# Patient Record
Sex: Female | Born: 1960 | Race: Black or African American | Hispanic: No | Smoking: Former smoker
Health system: Southern US, Community
[De-identification: ages and names within clinical notes are randomized; demographics above are authoritative.]

## PROBLEM LIST (undated history)

## (undated) DIAGNOSIS — R51 Headache: Secondary | ICD-10-CM

## (undated) DIAGNOSIS — E7800 Pure hypercholesterolemia, unspecified: Secondary | ICD-10-CM

## (undated) DIAGNOSIS — I10 Essential (primary) hypertension: Secondary | ICD-10-CM

## (undated) DIAGNOSIS — M1990 Unspecified osteoarthritis, unspecified site: Secondary | ICD-10-CM

## (undated) DIAGNOSIS — IMO0002 Reserved for concepts with insufficient information to code with codable children: Secondary | ICD-10-CM

## (undated) HISTORY — PX: TUBAL LIGATION: SHX77

## (undated) HISTORY — DX: Unspecified osteoarthritis, unspecified site: M19.90

## (undated) HISTORY — PX: UTERINE FIBROID SURGERY: SHX826

---

## 1998-07-20 ENCOUNTER — Emergency Department (HOSPITAL_COMMUNITY): Admission: EM | Admit: 1998-07-20 | Discharge: 1998-07-20 | Payer: Self-pay | Admitting: Emergency Medicine

## 1999-02-07 ENCOUNTER — Emergency Department (HOSPITAL_COMMUNITY): Admission: EM | Admit: 1999-02-07 | Discharge: 1999-02-07 | Payer: Self-pay | Admitting: Emergency Medicine

## 1999-08-04 ENCOUNTER — Emergency Department (HOSPITAL_COMMUNITY): Admission: EM | Admit: 1999-08-04 | Discharge: 1999-08-04 | Payer: Self-pay | Admitting: Emergency Medicine

## 2000-06-06 ENCOUNTER — Encounter: Payer: Self-pay | Admitting: Nephrology

## 2000-06-06 ENCOUNTER — Ambulatory Visit (HOSPITAL_COMMUNITY): Admission: RE | Admit: 2000-06-06 | Discharge: 2000-06-06 | Payer: Self-pay | Admitting: Nephrology

## 2000-07-06 ENCOUNTER — Other Ambulatory Visit: Admission: RE | Admit: 2000-07-06 | Discharge: 2000-07-06 | Payer: Self-pay | Admitting: Obstetrics & Gynecology

## 2001-12-10 ENCOUNTER — Other Ambulatory Visit: Admission: RE | Admit: 2001-12-10 | Discharge: 2001-12-10 | Payer: Self-pay | Admitting: Obstetrics and Gynecology

## 2002-01-08 ENCOUNTER — Ambulatory Visit (HOSPITAL_COMMUNITY): Admission: RE | Admit: 2002-01-08 | Discharge: 2002-01-08 | Payer: Self-pay | Admitting: Obstetrics and Gynecology

## 2002-01-08 ENCOUNTER — Encounter: Payer: Self-pay | Admitting: Obstetrics and Gynecology

## 2002-04-10 ENCOUNTER — Encounter: Payer: Self-pay | Admitting: Obstetrics and Gynecology

## 2002-04-10 ENCOUNTER — Ambulatory Visit (HOSPITAL_COMMUNITY): Admission: RE | Admit: 2002-04-10 | Discharge: 2002-04-10 | Payer: Self-pay | Admitting: Obstetrics and Gynecology

## 2002-10-09 ENCOUNTER — Encounter: Payer: Self-pay | Admitting: *Deleted

## 2002-10-09 ENCOUNTER — Emergency Department (HOSPITAL_COMMUNITY): Admission: EM | Admit: 2002-10-09 | Discharge: 2002-10-09 | Payer: Self-pay | Admitting: *Deleted

## 2003-01-16 ENCOUNTER — Ambulatory Visit (HOSPITAL_COMMUNITY): Admission: RE | Admit: 2003-01-16 | Discharge: 2003-01-16 | Payer: Self-pay | Admitting: Obstetrics and Gynecology

## 2003-01-16 ENCOUNTER — Encounter (INDEPENDENT_AMBULATORY_CARE_PROVIDER_SITE_OTHER): Payer: Self-pay

## 2003-10-29 ENCOUNTER — Ambulatory Visit (HOSPITAL_COMMUNITY): Admission: RE | Admit: 2003-10-29 | Discharge: 2003-10-29 | Payer: Self-pay | Admitting: Gastroenterology

## 2005-03-17 ENCOUNTER — Emergency Department (HOSPITAL_COMMUNITY): Admission: EM | Admit: 2005-03-17 | Discharge: 2005-03-17 | Payer: Self-pay | Admitting: Emergency Medicine

## 2005-05-10 ENCOUNTER — Ambulatory Visit: Payer: Self-pay | Admitting: Internal Medicine

## 2005-05-13 ENCOUNTER — Ambulatory Visit: Payer: Self-pay | Admitting: *Deleted

## 2005-06-15 ENCOUNTER — Ambulatory Visit: Payer: Self-pay | Admitting: Family Medicine

## 2005-06-23 ENCOUNTER — Ambulatory Visit: Payer: Self-pay | Admitting: Family Medicine

## 2005-09-02 ENCOUNTER — Ambulatory Visit: Payer: Self-pay | Admitting: Family Medicine

## 2005-10-14 ENCOUNTER — Ambulatory Visit: Payer: Self-pay | Admitting: Family Medicine

## 2006-01-27 ENCOUNTER — Ambulatory Visit: Payer: Self-pay | Admitting: Family Medicine

## 2006-09-20 ENCOUNTER — Ambulatory Visit: Payer: Self-pay | Admitting: Family Medicine

## 2006-09-22 ENCOUNTER — Ambulatory Visit: Payer: Self-pay | Admitting: Family Medicine

## 2006-09-29 ENCOUNTER — Ambulatory Visit: Payer: Self-pay | Admitting: Family Medicine

## 2006-09-29 ENCOUNTER — Ambulatory Visit (HOSPITAL_COMMUNITY): Admission: RE | Admit: 2006-09-29 | Discharge: 2006-09-29 | Payer: Self-pay | Admitting: Family Medicine

## 2006-11-02 ENCOUNTER — Ambulatory Visit: Payer: Self-pay | Admitting: Family Medicine

## 2008-04-05 ENCOUNTER — Emergency Department (HOSPITAL_COMMUNITY): Admission: EM | Admit: 2008-04-05 | Discharge: 2008-04-05 | Payer: Self-pay | Admitting: Emergency Medicine

## 2009-03-27 ENCOUNTER — Emergency Department (HOSPITAL_COMMUNITY): Admission: EM | Admit: 2009-03-27 | Discharge: 2009-03-27 | Payer: Self-pay | Admitting: Emergency Medicine

## 2010-11-15 ENCOUNTER — Emergency Department (HOSPITAL_COMMUNITY)
Admission: EM | Admit: 2010-11-15 | Discharge: 2010-11-15 | Payer: Self-pay | Source: Home / Self Care | Admitting: Emergency Medicine

## 2010-12-25 ENCOUNTER — Encounter: Payer: Self-pay | Admitting: Family Medicine

## 2011-02-15 LAB — CBC
HCT: 35.7 % — ABNORMAL LOW (ref 36.0–46.0)
Hemoglobin: 11.2 g/dL — ABNORMAL LOW (ref 12.0–15.0)
MCHC: 31.4 g/dL (ref 30.0–36.0)
RDW: 21.6 % — ABNORMAL HIGH (ref 11.5–15.5)
WBC: 9.2 10*3/uL (ref 4.0–10.5)

## 2011-02-15 LAB — URINALYSIS, ROUTINE W REFLEX MICROSCOPIC
Bilirubin Urine: NEGATIVE
Protein, ur: NEGATIVE mg/dL
Urobilinogen, UA: 0.2 mg/dL (ref 0.0–1.0)

## 2011-02-15 LAB — DIFFERENTIAL
Basophils Absolute: 0 10*3/uL (ref 0.0–0.1)
Basophils Relative: 0 % (ref 0–1)
Lymphocytes Relative: 25 % (ref 12–46)
Monocytes Absolute: 0.7 10*3/uL (ref 0.1–1.0)
Neutro Abs: 6 10*3/uL (ref 1.7–7.7)
Neutrophils Relative %: 65 % (ref 43–77)

## 2011-02-15 LAB — PROTIME-INR: INR: 1.03 (ref 0.00–1.49)

## 2011-02-15 LAB — POCT PREGNANCY, URINE: Preg Test, Ur: NEGATIVE

## 2011-02-15 LAB — URINE MICROSCOPIC-ADD ON

## 2011-02-15 LAB — APTT: aPTT: 30 seconds (ref 24–37)

## 2011-03-16 LAB — BASIC METABOLIC PANEL
BUN: 5 mg/dL — ABNORMAL LOW (ref 6–23)
Calcium: 8.8 mg/dL (ref 8.4–10.5)
Chloride: 108 mEq/L (ref 96–112)
Creatinine, Ser: 0.79 mg/dL (ref 0.4–1.2)
GFR calc Af Amer: >60 mL/min (ref >60)
GFR calc non Af Amer: >60 mL/min (ref >60)

## 2011-03-16 LAB — DIFFERENTIAL
Eosinophils Absolute: 0.1 10*3/uL (ref 0.0–0.7)
Lymphocytes Relative: 25 % (ref 12–46)
Lymphs Abs: 1.6 10*3/uL (ref 0.7–4.0)
Monocytes Relative: 8 % (ref 3–12)
Neutro Abs: 4.1 10*3/uL (ref 1.7–7.7)
Neutrophils Relative %: 65 % (ref 43–77)

## 2011-03-16 LAB — POCT CARDIAC MARKERS
CKMB, poc: <1 ng/mL — ABNORMAL LOW (ref 1.0–8.0)
Myoglobin, poc: 62.4 ng/mL (ref 12–200)

## 2011-03-16 LAB — CBC
Platelets: 270 10*3/uL (ref 150–400)
RBC: 4.39 MIL/uL (ref 3.87–5.11)
WBC: 6.4 10*3/uL (ref 4.0–10.5)

## 2011-04-22 NOTE — H&P (Signed)
NAME:  Frances Conley, Frances Conley                            ACCOUNT NO.:  [id number]   MEDICAL RECORD NO.:  [id number]                   PATIENT TYPE:  AMB   LOCATION:  SDC                                  FACILITY:  [facility]   PHYSICIAN:  Naima A. Dillard, M.D.              DATE OF BIRTH:  [birth year]   DATE OF ADMISSION:  01/14/2003  DATE OF DISCHARGE:                                HISTORY & PHYSICAL   CHIEF COMPLAINT:  Menometrorrhagia from endometrial fibroids.   HISTORY OF PRESENT ILLNESS:  The patient is a 50 year old gravida 3, para 3,  admitted for a D&C, hysteroscopy, resection of submucosal component of  fibroids for menometrorrhagia for several years.  The patient reports that  her periods were every 21 days, lasting for five to six days, and she  changed a pad or tampon every 30 minutes.  Also complains of occasional  intramenstrual spotting.  She does have dysmenorrhea.  She has tried Motrin  and Ortho Evra in the past, but felt that they did not help with the  bleeding.  The patient also had an endometrial biopsy which was benign.  She  had an ultrasound which showed her uterus to measure 11.9 cm x 4.6 cm x 7.5  cm, and demonstrated a fibroid in the right anterior upper uterine body  measuring 2.3 cm x 2.2 cm x 2.7 cm.  Upon a hysterogram it did show that 25%  of the fibroid had a submucosal component.  There were, however, no polyps  or endometrial thickening seen on the sonohysterogram.  The patient also had  an MRI in the past.  The MRI was significant for another small fibroid  measuring 2.0 cm at the fundus.  The patient did go for a consultation for  uterine artery embolization, but decided that she was not a candidate due to  how small the fibroids were.  The patient has agreed to try a D&C and  hysteroscopy.  She was offered other treatments such as just Lupron alone,  Depo-Provera, Lupron with a D&C, hysteroscopy, hysterectomy, myomectomy.  The patient has chosen a D&C with  hysteroscopy, myomectomy and Lupron.  The  patient has received a total of six months of Lupron.   PAST MEDICAL HISTORY:  1. Significant for hypertension.  2. Hypercholesterolemia.  3. Peptic ulcer disease.  4. Depression.  5. Questionable hypothyroidism.   MEDICATIONS:  1. Atenolol 50 mg p.o. daily.  2. She has had six months of Lupron.   PAST SURGICAL HISTORY:  1. Significant for a tubal ligation.  2. Three C-sections.   SOCIAL HISTORY:  Negative x3.  She just quit tobacco use in 2003.   FAMILY HISTORY:  Significant for hypertension and diabetes mellitus.  The  mother had breast cancer.  She is now deceased, and the patient is not sure  what her mother died from.   REVIEW OF SYSTEMS:  ENDOCRINE:  She has questionable hypothyroidism.  She is  supposed to follow up with Dr. Chestine Spore tomorrow for an evaluation.  CARDIOVASCULAR:  Unremarkable.  GI:  Significant for peptic ulcer disease.  GENITOURINARY:  As above.  MUSCULOSKELETAL:  Unremarkable.  PSYCHIATRIC:  A  history of depression, currently on no medications, and the patient denies  any suicidal or homicidal ideations.   PHYSICAL EXAMINATION:  VITAL SIGNS:  Blood pressure 130/80, weight 168  pounds.  HEENT:  Pupils equal.  Hearing is normal.  Throat is clear.  NECK:  Thyroid is not enlarged.  HEART:  A regular rate and rhythm.  CHEST:  Clear to auscultation bilaterally.  BACK:  No CVA tenderness.  ABDOMEN:  Nontender.  No masses or organomegaly.  EXTREMITIES:  No clubbing, cyanosis, or edema.  NEUROLOGIC:  Within normal limits.  PELVIC:  The vaginal examination is within normal limits.  Cervix is  nontender without any lesions.  Uterus is about 12-14 weeks size, nontender,  mobile.  Adnexa had no masses, nontender.   ASSESSMENT:  Symptomatic submucosal fibroids.  Patient with  menometrorrhagia.   PLAN:  The patient has chosen to have a D&C, hysteroscopy, hysteroscopic  myomectomy.  The risks of bleeding, infection,  perforation of the uterus,  damage to internal organs, and hysterectomy were explained to the patient in  detail.  The patient has chosen to proceed with the procedure.   LABORATORY DATA:  The ultrasound is as above.  The patient's Pap smear was normal in January  2003.                                                Naima A. Normand Sloop, M.D.    NAD/MEDQ  D:  01/14/2003  T:  01/14/2003  Job:  [id number]

## 2011-04-22 NOTE — Op Note (Signed)
NAME:  Frances Conley, Frances Conley                            ACCOUNT NO.:  [id number]   MEDICAL RECORD NO.:  [id number]                   PATIENT TYPE:  AMB   LOCATION:  ENDO                                 FACILITY:  [facility]   PHYSICIAN:  Graylin Shiver, M.D.                DATE OF BIRTH:  Feb 28, 1961   DATE OF PROCEDURE:  10/29/2003  DATE OF DISCHARGE:                                 OPERATIVE REPORT   PROCEDURE:  Esophagogastroduodenoscopy.   INDICATIONS:  Epigastric and abdominal pain, which has been chronic in  nature.  The patient has been taking Nexium but still gets this epigastric  pain.   PROCEDURE IN DETAIL:  Informed consent was obtained after explanation of the  risks of bleeding, infection, and perforation.   PREMEDICATIONS:  Fentanyl 50 mcg, IV Versed 7 mg IV.   PROCEDURE:  With the patient in the left lateral decubitus position, the  Olympus gastroscope was inserted into the oropharynx and passed into the  esophagus.  It was advanced down the esophagus and then into the stomach and  into the duodenum.  The second portion, bulb, and duodenum were normal.  The  stomach looked normal in its entirety.  The esophagus looked normal in its  entirety.  The esophagogastric junction was at 36 cm.  She tolerated the  procedure well without complications.   IMPRESSION:  Normal esophagogastroduodenoscopy.                                               Graylin Shiver, M.D.    [name]  D:  10/29/2003  T:  10/30/2003  Job:  [id number]

## 2011-04-22 NOTE — Op Note (Signed)
NAME:  Frances Conley, Frances Conley                            ACCOUNT NO.:  [id number]   MEDICAL RECORD NO.:  [id number]                   PATIENT TYPE:  AMB   LOCATION:  [facility]C                                  FACILITY:  [facility]   PHYSICIAN:  Naima A. Dillard, M.D.              DATE OF BIRTH:  10/21/1961   DATE OF PROCEDURE:  01/16/2003  DATE OF DISCHARGE:                                 OPERATIVE REPORT   PREOPERATIVE DIAGNOSES:  Menometrorrhagia with submucosal component of  transmural fibroids.   POSTOPERATIVE DIAGNOSES:  Menometrorrhagia with submucosal component of  transmural fibroids.   PROCEDURE:  Dilatation and curettage with hysteroscopic myomectomy.   SURGEON:  Naima A. Normand Sloop, M.D.   ANESTHESIA:  20 mL 1% lidocaine cervical block and laryngeal mask airway  anesthesia.   IV FLUIDS:  1100 mL crystalloid.   ESTIMATED BLOOD LOSS:  None.   COMPLICATIONS:  None.   FLUID DEFICIT:  There was an 85 mL deficit of 3% sorbitol.   FINDINGS:  Uterine sound was placed into uterine cavity to 10 cm.  There was  scant endometrial curettings.  There was a small fibroid on the anterior  wall on the right side of the uterine cavity.  There were no polyps or other  masses noted.  Both ostia were visualized.   PROCEDURE IN DETAIL:  The patient was taken to the operating room where she  was given general anesthesia with laryngeal mask airway and placed in dorsal  lithotomy position, prepped and draped in a normal sterile fashion.  Bladder  was emptied.  An examination was done and patient had 12 week sized slightly  irregular uterus.  No adnexal masses palpated.  A bivalve speculum was  placed into the vagina.  Anterior lip of the cervix was grasped with single  tooth tenaculum.  20 mL 1% lidocaine was used for cervical block.  The  uterine sound was then placed which sounded to 10 cm.  Cervix was then  dilated up to 21.  Diagnostic hysteroscope was placed into the uterine  cavity and the findings  noted above were seen.  We attempted to resect the  fibroid so the hysteroscope was removed.  The cervix was further dilated  with Pratt dilators up to 35.  The hysteroscope was then placed into the  uterine cavity and using a single loop with coagulative cautery, the fibroid  was hysteroscopically removed.  Hemostasis was noted.  There were no sites  of perforation.  The hysteroscope was then removed.  A sharp curette was  placed into the uterine cavity and scant amount of curettings were obtained.  All instruments were removed from the vagina.  The tenaculum was removed  from the anterior lip of the cervix with hemostasis noted.  Sponge, lap, and  needle counts were correct x2.  The patient went to recovery room in stable  condition.  Naima A. Normand Sloop, M.D.   NAD/MEDQ  D:  01/16/2003  T:  01/16/2003  Job:  [id number]

## 2012-06-02 ENCOUNTER — Encounter (HOSPITAL_COMMUNITY): Payer: Self-pay | Admitting: *Deleted

## 2012-06-02 ENCOUNTER — Emergency Department (HOSPITAL_COMMUNITY)
Admission: EM | Admit: 2012-06-02 | Discharge: 2012-06-03 | Disposition: A | Discharge status: Skilled Nursing Facility | Payer: Self-pay | Attending: Emergency Medicine | Admitting: Emergency Medicine

## 2012-06-02 DIAGNOSIS — I10 Essential (primary) hypertension: Secondary | ICD-10-CM | POA: Insufficient documentation

## 2012-06-02 DIAGNOSIS — R51 Headache: Secondary | ICD-10-CM | POA: Insufficient documentation

## 2012-06-02 DIAGNOSIS — E7800 Pure hypercholesterolemia, unspecified: Secondary | ICD-10-CM | POA: Insufficient documentation

## 2012-06-02 DIAGNOSIS — F17200 Nicotine dependence, unspecified, uncomplicated: Secondary | ICD-10-CM | POA: Insufficient documentation

## 2012-06-02 HISTORY — DX: Pure hypercholesterolemia, unspecified: E78.00

## 2012-06-02 HISTORY — DX: Reserved for concepts with insufficient information to code with codable children: IMO0002

## 2012-06-02 HISTORY — DX: Essential (primary) hypertension: I10

## 2012-06-02 HISTORY — DX: Headache: R51

## 2012-06-02 LAB — URINALYSIS, ROUTINE W REFLEX MICROSCOPIC
Bilirubin Urine: NEGATIVE
Glucose, UA: NEGATIVE mg/dL
Hgb urine dipstick: NEGATIVE
Ketones, ur: NEGATIVE mg/dL
Nitrite: NEGATIVE
Protein, ur: NEGATIVE mg/dL
Specific Gravity, Urine: 1.011 (ref 1.005–1.030)
Urobilinogen, UA: 0.2 mg/dL (ref 0.0–1.0)
pH: 6 (ref 5.0–8.0)

## 2012-06-02 LAB — POCT I-STAT, CHEM 8
BUN: 8 mg/dL (ref 6–23)
Calcium, Ion: 1.15 mmol/L (ref 1.12–1.32)
Chloride: 106 mEq/L (ref 96–112)
Creatinine, Ser: 0.9 mg/dL (ref 0.50–1.10)
Glucose, Bld: 81 mg/dL (ref 70–99)
HCT: 45 % (ref 36.0–46.0)
Hemoglobin: 15.3 g/dL — ABNORMAL HIGH (ref 12.0–15.0)
Potassium: 3.4 mEq/L — ABNORMAL LOW (ref 3.5–5.1)
Sodium: 143 mEq/L (ref 135–145)
TCO2: 24 mmol/L (ref 0–100)

## 2012-06-02 LAB — URINE MICROSCOPIC-ADD ON

## 2012-06-02 MED ORDER — HYDROCHLOROTHIAZIDE 25 MG PO TABS: 25.0000 mg | ORAL_TABLET | Freq: Every day | ORAL | Status: DC

## 2012-06-02 MED ORDER — IBUPROFEN 200 MG PO TABS
400.0000 mg | ORAL_TABLET | Freq: Once | ORAL | Status: AC
  Administered 2012-06-02: 400 mg via ORAL
  Filled 2012-06-02 (×2): qty 1

## 2012-06-02 MED ORDER — LORAZEPAM 0.5 MG PO TABS
0.5000 mg | ORAL_TABLET | Freq: Once | ORAL | Status: AC
  Administered 2012-06-02: 0.5 mg via ORAL
  Filled 2012-06-02: qty 1

## 2012-06-02 MED ORDER — OXYCODONE-ACETAMINOPHEN 5-325 MG PO TABS
2.0000 | ORAL_TABLET | Freq: Once | ORAL | Status: AC
  Administered 2012-06-02: 2 via ORAL
  Filled 2012-06-02: qty 2

## 2012-06-02 NOTE — Discharge Instructions (Signed)
Headaches, Frequently Asked Questions MIGRAINE HEADACHES Q: What is migraine? What causes it? How can I treat it? A: Generally, migraine headaches begin as a dull ache. Then they develop into a constant, throbbing, and pulsating pain. You may experience pain at the temples. You may experience pain at the front or back of one or both sides of the head. The pain is usually accompanied by a combination of:  Nausea.   Vomiting.   Sensitivity to light and noise.  Some people (about 15%) experience an aura (see below) before an attack. The cause of migraine is believed to be chemical reactions in the brain. Treatment for migraine may include over-the-counter or prescription medications. It may also include self-help techniques. These include relaxation training and biofeedback.  Q: What is an aura? A: About 15% of people with migraine get an "aura". This is a sign of neurological symptoms that occur before a migraine headache. You may see wavy or jagged lines, dots, or flashing lights. You might experience tunnel vision or blind spots in one or both eyes. The aura can include visual or auditory hallucinations (something imagined). It may include disruptions in smell (such as strange odors), taste or touch. Other symptoms include:  Numbness.   A "pins and needles" sensation.   Difficulty in recalling or speaking the correct word.  These neurological events may last as long as 60 minutes. These symptoms will fade as the headache begins. Q: What is a trigger? A: Certain physical or environmental factors can lead to or "trigger" a migraine. These include:  Foods.   Hormonal changes.   Weather.   Stress.  It is important to remember that triggers are different for everyone. To help prevent migraine attacks, you need to figure out which triggers affect you. Keep a headache diary. This is a good way to track triggers. The diary will help you talk to your healthcare professional about your  condition. Q: Does weather affect migraines? A: Bright sunshine, hot, humid conditions, and drastic changes in barometric pressure may lead to, or "trigger," a migraine attack in some people. But studies have shown that weather does not act as a trigger for everyone with migraines. Q: What is the link between migraine and hormones? A: Hormones start and regulate many of your body's functions. Hormones keep your body in balance within a constantly changing environment. The levels of hormones in your body are unbalanced at times. Examples are during menstruation, pregnancy, or menopause. That can lead to a migraine attack. In fact, about three quarters of all women with migraine report that their attacks are related to the menstrual cycle.  Q: Is there an increased risk of stroke for migraine sufferers? A: The likelihood of a migraine attack causing a stroke is very remote. That is not to say that migraine sufferers cannot have a stroke associated with their migraines. In persons under age 40, the most common associated factor for stroke is migraine headache. But over the course of a person's normal life span, the occurrence of migraine headache may actually be associated with a reduced risk of dying from cerebrovascular disease due to stroke.  Q: What are acute medications for migraine? A: Acute medications are used to treat the pain of the headache after it has started. Examples over-the-counter medications, NSAIDs, ergots, and triptans.  Q: What are the triptans? A: Triptans are the newest class of abortive medications. They are specifically targeted to treat migraine. Triptans are vasoconstrictors. They moderate some chemical reactions in the brain.   The triptans work on receptors in your brain. Triptans help to restore the balance of a neurotransmitter called serotonin. Fluctuations in levels of serotonin are thought to be a main cause of migraine.  Q: Are over-the-counter medications for migraine  effective? A: Over-the-counter, or "OTC," medications may be effective in relieving mild to moderate pain and associated symptoms of migraine. But you should see your caregiver before beginning any treatment regimen for migraine.  Q: What are preventive medications for migraine? A: Preventive medications for migraine are sometimes referred to as "prophylactic" treatments. They are used to reduce the frequency, severity, and length of migraine attacks. Examples of preventive medications include antiepileptic medications, antidepressants, beta-blockers, calcium channel blockers, and NSAIDs (nonsteroidal anti-inflammatory drugs). Q: Why are anticonvulsants used to treat migraine? A: During the past few years, there has been an increased interest in antiepileptic drugs for the prevention of migraine. They are sometimes referred to as "anticonvulsants". Both epilepsy and migraine may be caused by similar reactions in the brain.  Q: Why are antidepressants used to treat migraine? A: Antidepressants are typically used to treat people with depression. They may reduce migraine frequency by regulating chemical levels, such as serotonin, in the brain.  Q: What alternative therapies are used to treat migraine? A: The term "alternative therapies" is often used to describe treatments considered outside the scope of conventional Western medicine. Examples of alternative therapy include acupuncture, acupressure, and yoga. Another common alternative treatment is herbal therapy. Some herbs are believed to relieve headache pain. Always discuss alternative therapies with your caregiver before proceeding. Some herbal products contain arsenic and other toxins. TENSION HEADACHES Q: What is a tension-type headache? What causes it? How can I treat it? A: Tension-type headaches occur randomly. They are often the result of temporary stress, anxiety, fatigue, or anger. Symptoms include soreness in your temples, a tightening  band-like sensation around your head (a "vice-like" ache). Symptoms can also include a pulling feeling, pressure sensations, and contracting head and neck muscles. The headache begins in your forehead, temples, or the back of your head and neck. Treatment for tension-type headache may include over-the-counter or prescription medications. Treatment may also include self-help techniques such as relaxation training and biofeedback. CLUSTER HEADACHES Q: What is a cluster headache? What causes it? How can I treat it? A: Cluster headache gets its name because the attacks come in groups. The pain arrives with little, if any, warning. It is usually on one side of the head. A tearing or bloodshot eye and a runny nose on the same side of the headache may also accompany the pain. Cluster headaches are believed to be caused by chemical reactions in the brain. They have been described as the most severe and intense of any headache type. Treatment for cluster headache includes prescription medication and oxygen. SINUS HEADACHES Q: What is a sinus headache? What causes it? How can I treat it? A: When a cavity in the bones of the face and skull (a sinus) becomes inflamed, the inflammation will cause localized pain. This condition is usually the result of an allergic reaction, a tumor, or an infection. If your headache is caused by a sinus blockage, such as an infection, you will probably have a fever. An x-ray will confirm a sinus blockage. Your caregiver's treatment might include antibiotics for the infection, as well as antihistamines or decongestants.  REBOUND HEADACHES Q: What is a rebound headache? What causes it? How can I treat it? A: A pattern of taking acute headache medications too   often can lead to a condition known as "rebound headache." A pattern of taking too much headache medication includes taking it more than 2 days per week or in excessive amounts. That means more than the label or a caregiver advises.  With rebound headaches, your medications not only stop relieving pain, they actually begin to cause headaches. Doctors treat rebound headache by tapering the medication that is being overused. Sometimes your caregiver will gradually substitute a different type of treatment or medication. Stopping may be a challenge. Regularly overusing a medication increases the potential for serious side effects. Consult a caregiver if you regularly use headache medications more than 2 days per week or more than the label advises. ADDITIONAL QUESTIONS AND ANSWERS Q: What is biofeedback? A: Biofeedback is a self-help treatment. Biofeedback uses special equipment to monitor your body's involuntary physical responses. Biofeedback monitors:  Breathing.   Pulse.   Heart rate.   Temperature.   Muscle tension.   Brain activity.  Biofeedback helps you refine and perfect your relaxation exercises. You learn to control the physical responses that are related to stress. Once the technique has been mastered, you do not need the equipment any more. Q: Are headaches hereditary? A: Four out of five (80%) of people that suffer report a family history of migraine. Scientists are not sure if this is genetic or a family predisposition. Despite the uncertainty, a child has a 50% chance of having migraine if one parent suffers. The child has a 75% chance if both parents suffer.  Q: Can children get headaches? A: By the time they reach high school, most young people have experienced some type of headache. Many safe and effective approaches or medications can prevent a headache from occurring or stop it after it has begun.  Q: What type of doctor should I see to diagnose and treat my headache? A: Start with your primary caregiver. Discuss his or her experience and approach to headaches. Discuss methods of classification, diagnosis, and treatment. Your caregiver may decide to recommend you to a headache specialist, depending upon  your symptoms or other physical conditions. Having diabetes, allergies, etc., may require a more comprehensive and inclusive approach to your headache. The National Headache Foundation will provide, upon request, a list of [facility] physician members in your state. Document Released: 02/11/2004 Document Revised: 11/10/2011 Document Reviewed: 07/21/2008 [facility] Patient Information 2012 [place], [place].Hypertension Hypertension is another name for high blood pressure. High blood pressure may mean that your heart needs to work harder to pump blood. Blood pressure consists of two numbers, which includes a higher number over a lower number (example: 110/72). HOME CARE   Make lifestyle changes as told by your doctor. This may include weight loss and exercise.   Take your blood pressure medicine every day.   Limit how much salt you use.   Stop smoking if you smoke.   Do not use drugs.   Talk to your doctor if you are using decongestants or birth control pills. These medicines might make blood pressure higher.   Females should not drink more than 1 alcoholic drink per day. Males should not drink more than 2 alcoholic drinks per day.   See your doctor as told.  GET HELP RIGHT AWAY IF:   You have a blood pressure reading with a top number of 180 or higher.   You get a very bad headache.   You get blurred or changing vision.   You feel confused.   You feel weak, numb, or  faint.   You get chest or belly (abdominal) pain.   You throw up (vomit).   You cannot breathe very well.  MAKE SURE YOU:   Understand these instructions.   Will watch your condition.   Will get help right away if you are not doing well or get worse.  Document Released: 05/09/2008 Document Revised: 11/10/2011 Document Reviewed: 05/09/2008 [facility] Patient Information 2012 [place], [place].  RESOURCE GUIDE  Chronic Pain Problems: Contact [facility]  [phone] Patients need to be referred by their  primary care doctor.  Insufficient Money for Medicine: Contact United Way:  call "211" or [facility] (972) 636-5242.  No Primary Care Doctor: - Call [facility] Connect  ([phone] - can help you locate a primary care doctor that  accepts your insurance, provides certain services, etc. - Physician Referral Service- [phone]  Agencies that provide inexpensive medical care: - [facility]  [phone] - [facility]  [phone] - [facility]  [phone] - Women's Clinic  [phone] - Planned Parenthood  [phone] [facility]  [phone]  Medicaid-accepting [facility] Providers: - [facility]- [address]  [phone], Mon-Fri 9am-7pm, Sat 9am-1pm - [facility]- [address] [place], [address] [place]  [phone] - [facility]- [address] [place]  [phone] [facility]- [address]  [phone] - Renaye Rakers- [address] [place], [address], [phone]  Only accepts [place] patients after they have their name  applied to their card  Self Pay (no insurance) in [place]: - Sickle Cell Patients: Dr Willey Blade, [facility]  [address] [place], [phone] - [facility]- [address] [place]  [phone]       [facility]- [address]       -     [facility]- see information above (Speak to [employer] if you do not have insurance)       -  [facility]- [address] [place], [phone]       -  [facility]- [address],  [phone]       -  [facility]- [address] [phone]       -  Dr Julio Sicks-  [address], [place], [phone]       -  [facility]- [address], [phone]       -  [facility]- [address] [phone], also [address], [phone]        -    [facility]- [address] [place], [phone], 1st & 3rd Saturday   every month, 10am-1pm  1) Find a Doctor and Pay Out of Pocket Although you won't have to find out who is covered by your insurance plan, it is a good idea to ask around and get recommendations. You will then need to call the office and see if the doctor you have chosen will accept you as a new patient and what types of options they offer for patients who are self-pay. Some doctors offer discounts or will set up payment plans for their patients who do not have insurance, but you will need to ask so you aren't surprised when you get to your  appointment.  2) Contact Your Local Health Department Not all health departments have doctors that can see patients for sick visits, but many do, so it is worth a call to see if yours does. If you don't know where your local health department is, you can check in your phone book. The CDC also has a tool to help you locate your state's health department, and many state websites also have listings of all of their local health departments.  3) Find a Walk-in Clinic If your illness is not likely to be very severe or complicated, you may want to try a walk in clinic. These are popping up all over the country in pharmacies, drugstores, and shopping centers. They're usually staffed by nurse practitioners or physician assistants that have been trained to treat common illnesses and complaints. They're usually fairly quick and inexpensive. However, if you have serious medical issues or chronic medical problems, these are probably not your best option  STD Testing - [facility] Department of [facility], STD Clinic, [address], [place], phone [phone] or [phone].  Monday - Friday, call for an appointment. [facility] Department of [employer], STD Clinic, [address], [place], phone [phone] or [phone].  Monday - Friday, call for an  appointment.  Abuse/Neglect: [facility] Child Abuse Hotline (931)215-0931 [facility] Child Abuse Hotline 901 167 0490 (After Hours)  Emergency Shelter:  [facility] [phone]  Maternity Homes: - [facility] 317-572-0737 - [facility] (503)103-6823  MRSA Hotline #:   (405)781-1746  [facility] - Eat [facility]  [facility]  United Way [facility] Dept. [address]                 [address]         [address] [place] [address]  Blondell Reveal Phone:  [phone]                                  Phone:  [phone]                   Phone:  [phone]  [facility], [phone] - [facility] - [phone]       -     [facility] in [place], [address]                                  [phone], [facility] Child Abuse Hotline 813-308-6878 or (979)390-9993 (After Hours)   Behavioral Health Services  Substance Abuse Resources: - [facility] Drug Services  (905)749-7513 - [facility] 812-722-4626 - [facility] [phone] Floydene Flock (831)306-6874 - Residential & Outpatient Substance Abuse Program  [phone]  Psychological Services: - [facility]  [phone] [facility]  [phone] - [facility], [phone] [address], [place], ACCESS LINE: 778-621-4656 or 703-138-9059, [employer]za  Dental Assistance  If unable to pay or uninsured, contact:  [facility] or [facility]. to become qualified for the adult dental clinic.  Patients with Medicaid: [facility] [phone] [address], [phone] [address] [phone]  If unable to pay,  or uninsured, contact [facility] [phone]) or [facility] Department [phone] in [place], [phone] in [facility]) to become qualified for the adult dental clinic  Other Low-Cost Community Dental Services: - [facility]- [address] [place], [place], [place], [address], [phone], [address], 2nd and 4th Thursday of the month at 6:30am.  10 clients each day by appointment, can sometimes see walk-in patients if someone does not show for an appointment. [facility]- [address] [place], [place], [address], [phone] - [facility]- [address] [place], [place], [address], [phone] - [facility]- [phone] [facility] Health Department- [phone] [facility] Department- [phone]

## 2012-06-02 NOTE — ED Notes (Signed)
Pt c/o headache and tingling in head x 2+ weeks. Pt has hx of hypertension and has not taken medications for that for several years. Pt restarted BP meds yesterday.

## 2012-06-02 NOTE — ED Provider Notes (Addendum)
History    51 year old female with headache. Diffuse. Describes as pressure. Relatively constant for the past 2 weeks. Patient with a history of hypertension. Has not been on medication for approximately 2 years until she restarted her former medicines yesterday. No appreciable exacerbating relieving factors in terms her headache. No acute visual complaints. No nausea or vomiting. No numbness, tingling or loss of strength. No fevers or chills. No neck pain or neck stiffness. Denies trauma.  CSN: [id number]  Arrival date & time 06/02/12  4098   First MD Initiated Contact with Patient 06/02/12 2226      Chief Complaint  Patient presents with  . Headache  . Hypertension    (Consider location/radiation/quality/duration/timing/severity/associated sxs/prior treatment) HPI  Past Medical History  Diagnosis Date  . Hypertension   . Headache   . Hypercholesteremia   . Ulcer     Past Surgical History  Procedure Date  . Tubal ligation     History reviewed. No pertinent family history.  History  Substance Use Topics  . Smoking status: Current Everyday Smoker -- 0.5 packs/day    Types: Cigarettes  . Smokeless tobacco: Not on file  . Alcohol Use: No    OB History    Grav Para Term Preterm Abortions TAB SAB Ect Mult Living                  Review of Systems   Review of symptoms negative unless otherwise noted in HPI.   Allergies  Review of patient's allergies indicates no known allergies.  Home Medications   Current Outpatient Rx  Name Route Sig Dispense Refill  . ATENOLOL 50 MG PO TABS Oral Take 50 mg by mouth at bedtime.     Marland Kitchen HYDROCHLOROTHIAZIDE 12.5 MG PO TABS Oral Take 12.5 mg by mouth daily.    Marland Kitchen OMEPRAZOLE 20 MG PO CPDR Oral Take 20 mg by mouth daily.      BP 180/107  Pulse 55  Temp 98.9 F (37.2 C) (Oral)  Resp 16  SpO2 100%  Physical Exam  Nursing note and vitals reviewed. Constitutional: She is oriented to person, place, and time. She appears  well-developed and well-nourished. No distress.  HENT:  Head: Normocephalic and atraumatic.  Eyes: Conjunctivae and EOM are normal. Pupils are equal, round, and reactive to light. Right eye exhibits no discharge. Left eye exhibits no discharge.  Neck: Normal range of motion. Neck supple.  Cardiovascular: Normal rate, regular rhythm and normal heart sounds.  Exam reveals no gallop and no friction rub.   No murmur heard. Pulmonary/Chest: Effort normal and breath sounds normal. No respiratory distress.  Abdominal: Soft. She exhibits no distension. There is no tenderness.  Musculoskeletal: She exhibits no edema and no tenderness.  Lymphadenopathy:    She has no cervical adenopathy.  Neurological: She is alert and oriented to person, place, and time. No cranial nerve deficit. She exhibits normal muscle tone. Coordination normal.       Good finger to nose testing bilaterally. Gait is steady.  Skin: Skin is warm and dry.  Psychiatric: She has a normal mood and affect. Her behavior is normal. Thought content normal.    ED Course  Procedures (including critical care time)  Labs Reviewed - No data to display No results found.  EKG:  Rhythm: Sinus bradycardia Rate: 51 Axis: Normal Intervals: Normal ST segments: Nonspecific ST changes. T wave inversions in aVL.  1. Hypertension   2. Headache       MDM  51yF with  HA. Suspect primary HA. Consider emergent secondary causes such as bleed, infectious or mass but doubt. There is no history of trauma. Pt has a nonfocal neurological exam. Afebrile and neck supple. No use of blood thinning medication. Consider ocular etiology such as acute angle closure glaucoma but doubt. Pt denies acute change in visual acuity and eye exam unremarkable. Doubt temporal arteritis given age, no temporal tenderness and temporal artery pulsations palpable. Doubt CO poisoning. No contacts with similar symptoms. Doubt venous thrombosis. Doubt carotid or vertebral  arteries dissection. Symptoms improved with meds. Feel that can be safely discharged, but strict return precautions discussed. Outpt fu. Patient with hypertension previously on medications which she just restarted yesterday. Will increase hydrochlorothiazide to 25 mg daily. Patient instructed to increase her atenolol because of her bradycardia. She has no dizziness or lightheadedness. Renal function is fine and no hyponatremia. Discussed the need for followup for further blood pressure management.         Raeford Razor, MD 06/07/12 1811  Raeford Razor, MD 06/22/12 [phone]

## 2012-06-02 NOTE — ED Notes (Signed)
MD at bedside. 

## 2013-05-03 ENCOUNTER — Encounter (HOSPITAL_COMMUNITY): Payer: Self-pay | Admitting: *Deleted

## 2013-05-03 ENCOUNTER — Emergency Department (HOSPITAL_COMMUNITY)
Admission: EM | Admit: 2013-05-03 | Discharge: 2013-05-03 | Disposition: A | Discharge status: Home / Self Care | Payer: Self-pay | Attending: Emergency Medicine | Admitting: Emergency Medicine

## 2013-05-03 DIAGNOSIS — F17200 Nicotine dependence, unspecified, uncomplicated: Secondary | ICD-10-CM | POA: Insufficient documentation

## 2013-05-03 DIAGNOSIS — L509 Urticaria, unspecified: Secondary | ICD-10-CM | POA: Insufficient documentation

## 2013-05-03 DIAGNOSIS — E7800 Pure hypercholesterolemia, unspecified: Secondary | ICD-10-CM | POA: Insufficient documentation

## 2013-05-03 DIAGNOSIS — B86 Scabies: Secondary | ICD-10-CM | POA: Insufficient documentation

## 2013-05-03 DIAGNOSIS — Z872 Personal history of diseases of the skin and subcutaneous tissue: Secondary | ICD-10-CM | POA: Insufficient documentation

## 2013-05-03 DIAGNOSIS — Z8679 Personal history of other diseases of the circulatory system: Secondary | ICD-10-CM | POA: Insufficient documentation

## 2013-05-03 DIAGNOSIS — Z79899 Other long term (current) drug therapy: Secondary | ICD-10-CM | POA: Insufficient documentation

## 2013-05-03 DIAGNOSIS — I10 Essential (primary) hypertension: Secondary | ICD-10-CM | POA: Insufficient documentation

## 2013-05-03 MED ORDER — PERMETHRIN 5 % EX CREA: TOPICAL_CREAM | CUTANEOUS | Status: DC

## 2013-05-03 MED ORDER — SIMVASTATIN 20 MG PO TABS: 20.0000 mg | ORAL_TABLET | Freq: Every day | ORAL | Status: DC

## 2013-05-03 MED ORDER — LISINOPRIL-HYDROCHLOROTHIAZIDE 20-25 MG PO TABS: 1.0000 | ORAL_TABLET | Freq: Every day | ORAL | Status: DC

## 2013-05-03 MED ORDER — OMEPRAZOLE 20 MG PO CPDR: 20.0000 mg | DELAYED_RELEASE_CAPSULE | Freq: Every day | ORAL | Status: DC

## 2013-05-03 NOTE — ED Provider Notes (Signed)
History  This chart was scribed for Frances Hooker, PA-C working with Dione Booze, MD by Ardelia Mems, ED Scribe. This patient was seen in room TR06C/TR06C and the patient's care was started at 4:14 PM.   CSN: [id number]  Arrival date & time 05/03/13  1520     Chief Complaint  Patient presents with  . Rash     The history is provided by the patient. No language interpreter was used.    HPI Comments: LEVANA Conley is a 52 y.o. female who presents to the Emergency Department complaining of a gradual onset, gradually worsening constant, moderate rash that began on her wrist 2 weeks ago and has spread over her entire body. Pt reports associated severe itching. Pt is scratching wrists currently. Pt states that she does not have a PCP and that she is out of her blood pressure medications. Pt denies alcohol use and is a current every day smoker of 0.5 packs/day.  Past Medical History  Diagnosis Date  . Hypertension   . Headache(784.0)   . Hypercholesteremia   . Ulcer     Past Surgical History  Procedure Laterality Date  . Tubal ligation      History reviewed. No pertinent family history.  History  Substance Use Topics  . Smoking status: Current Every Day Smoker -- 0.50 packs/day    Types: Cigarettes  . Smokeless tobacco: Not on file  . Alcohol Use: No    OB History   Grav Para Term Preterm Abortions TAB SAB Ect Mult Living                  Review of Systems  Skin: Positive for rash.    Allergies  Review of patient's allergies indicates no known allergies.  Home Medications   Current Outpatient Rx  Name  Route  Sig  Dispense  Refill  . IRON PO   Oral   Take 1 tablet by mouth daily.         Marland Kitchen lisinopril-hydrochlorothiazide (PRINZIDE,ZESTORETIC) 20-25 MG per tablet   Oral   Take 1 tablet by mouth daily.         . Multiple Vitamin (MULTIVITAMIN WITH MINERALS) TABS   Oral   Take 1 tablet by mouth daily.         Marland Kitchen omeprazole (PRILOSEC) 20 MG capsule    Oral   Take 20 mg by mouth daily.         . simvastatin (ZOCOR) 20 MG tablet   Oral   Take 20 mg by mouth daily.           Triage Vitals: BP 150/96  Pulse 71  Temp(Src) 98.7 F (37.1 C) (Oral)  Resp 18  SpO2 100%  Physical Exam  Nursing note and vitals reviewed. Constitutional: She is oriented to person, place, and time. She appears well-developed and well-nourished.  HENT:  Head: Normocephalic and atraumatic.  Eyes: EOM are normal. Pupils are equal, round, and reactive to light.  Neck: Normal range of motion. No tracheal deviation present.  Cardiovascular: Normal rate.   Pulmonary/Chest: Effort normal. No respiratory distress.  Abdominal: Soft. There is no tenderness.  Musculoskeletal: Normal range of motion. She exhibits no tenderness.  Neurological: She is alert and oriented to person, place, and time.  Skin: Skin is warm. Rash noted.  Maculopapular rash, volar wrist bilaterally. No pustules. Findings consistent with scabies  Psychiatric: She has a normal mood and affect.    ED Course  Procedures (including critical care  time)  DIAGNOSTIC STUDIES: Oxygen Saturation is 100% on RA, normal by my interpretation.    COORDINATION OF CARE: 4:34 PM- Pt advised of plan for treatment and pt agrees.     Labs Reviewed - No data to display No results found.   No diagnosis found.  1. Scabies   MDM  Pruritic rash in location c/w scabies infection.          I personally performed the services described in this documentation, which was scribed in my presence. The recorded information has been reviewed and is accurate.     Frances Hooker, PA-C 05/03/13 1644

## 2013-05-03 NOTE — ED Notes (Signed)
Pt reports rash and itching to entire body x 1.5 weeks. Airway intact.

## 2013-05-04 NOTE — ED Provider Notes (Signed)
Medical screening examination/treatment/procedure(s) were performed by non-physician practitioner and as supervising physician I was immediately available for consultation/collaboration.  Irean Kendricks, MD 05/04/13 0024 

## 2013-07-24 ENCOUNTER — Ambulatory Visit: Payer: Self-pay

## 2015-08-04 ENCOUNTER — Encounter (HOSPITAL_BASED_OUTPATIENT_CLINIC_OR_DEPARTMENT_OTHER): Payer: Self-pay | Admitting: *Deleted

## 2015-08-04 ENCOUNTER — Other Ambulatory Visit: Payer: Self-pay

## 2015-08-04 ENCOUNTER — Emergency Department (HOSPITAL_BASED_OUTPATIENT_CLINIC_OR_DEPARTMENT_OTHER)
Admission: EM | Admit: 2015-08-04 | Discharge: 2015-08-04 | Disposition: A | Discharge status: Home / Self Care | Payer: 59 | Attending: Emergency Medicine | Admitting: Emergency Medicine

## 2015-08-04 DIAGNOSIS — E780 Pure hypercholesterolemia: Secondary | ICD-10-CM | POA: Insufficient documentation

## 2015-08-04 DIAGNOSIS — I10 Essential (primary) hypertension: Secondary | ICD-10-CM | POA: Diagnosis not present

## 2015-08-04 DIAGNOSIS — Z79899 Other long term (current) drug therapy: Secondary | ICD-10-CM | POA: Insufficient documentation

## 2015-08-04 DIAGNOSIS — Z872 Personal history of diseases of the skin and subcutaneous tissue: Secondary | ICD-10-CM | POA: Insufficient documentation

## 2015-08-04 DIAGNOSIS — R42 Dizziness and giddiness: Secondary | ICD-10-CM | POA: Insufficient documentation

## 2015-08-04 DIAGNOSIS — Z720 Tobacco use: Secondary | ICD-10-CM | POA: Insufficient documentation

## 2015-08-04 LAB — TROPONIN I: Troponin I: <0.03 ng/mL (ref <0.031)

## 2015-08-04 LAB — CBC WITH DIFFERENTIAL/PLATELET
Basophils Absolute: 0 10*3/uL (ref 0.0–0.1)
Basophils Relative: 0 % (ref 0–1)
Eosinophils Absolute: 0.1 10*3/uL (ref 0.0–0.7)
Eosinophils Relative: 2 % (ref 0–5)
HEMATOCRIT: 38 % (ref 36.0–46.0)
HEMOGLOBIN: 12.9 g/dL (ref 12.0–15.0)
LYMPHS PCT: 41 % (ref 12–46)
Lymphs Abs: 2.4 10*3/uL (ref 0.7–4.0)
MCH: 27.7 pg (ref 26.0–34.0)
MCHC: 33.9 g/dL (ref 30.0–36.0)
MCV: 81.5 fL (ref 78.0–100.0)
MONO ABS: 0.4 10*3/uL (ref 0.1–1.0)
MONOS PCT: 7 % (ref 3–12)
NEUTROS ABS: 2.9 10*3/uL (ref 1.7–7.7)
NEUTROS PCT: 50 % (ref 43–77)
Platelets: 194 10*3/uL (ref 150–400)
RBC: 4.66 MIL/uL (ref 3.87–5.11)
RDW: 14.7 % (ref 11.5–15.5)
WBC: 5.8 10*3/uL (ref 4.0–10.5)

## 2015-08-04 LAB — COMPREHENSIVE METABOLIC PANEL
ALBUMIN: 3.7 g/dL (ref 3.5–5.0)
ALT: 13 U/L — ABNORMAL LOW (ref 14–54)
ANION GAP: 9 (ref 5–15)
AST: 17 U/L (ref 15–41)
Alkaline Phosphatase: 75 U/L (ref 38–126)
BUN: 11 mg/dL (ref 6–20)
CHLORIDE: 108 mmol/L (ref 101–111)
CO2: 25 mmol/L (ref 22–32)
Calcium: 8.7 mg/dL — ABNORMAL LOW (ref 8.9–10.3)
Creatinine, Ser: 0.84 mg/dL (ref 0.44–1.00)
GFR calc non Af Amer: >60 mL/min (ref >60)
GLUCOSE: 81 mg/dL (ref 65–99)
POTASSIUM: 3.4 mmol/L — AB (ref 3.5–5.1)
SODIUM: 142 mmol/L (ref 135–145)
Total Bilirubin: 0.6 mg/dL (ref 0.3–1.2)
Total Protein: 6.6 g/dL (ref 6.5–8.1)

## 2015-08-04 MED ORDER — LISINOPRIL-HYDROCHLOROTHIAZIDE 20-25 MG PO TABS: 1.0000 | ORAL_TABLET | Freq: Every day | ORAL | Status: DC

## 2015-08-04 NOTE — ED Notes (Addendum)
Dizziness, blurred vision, headache, and lightheaded for a week. She ran out of Lisinopril and HCTZ 2 weeks ago.

## 2015-08-04 NOTE — ED Provider Notes (Signed)
CSN: [id number]     Arrival date & time 08/04/15  1056 History   First MD Initiated Contact with Patient 08/04/15 1156     Chief Complaint  Patient presents with  . Dizziness     (Consider location/radiation/quality/duration/timing/severity/associated sxs/prior Treatment) HPI Comments: Patient is a 54 year old female with history of hypertension. She presents for evaluation of weakness, lightheadedness for the past week. She reports running out of her blood pressure medication 2 weeks ago and having elevated blood pressures. She also reports headache and generalized malaise. She denies any chest pain or shortness of breath. She denies any fevers or chills.  Patient is a 54 y.o. female presenting with dizziness. The history is provided by the patient.  Dizziness Quality:  Lightheadedness Severity:  Moderate Onset quality:  Gradual Duration:  1 week Timing:  Intermittent Progression:  Worsening Chronicity:  New Relieved by:  Nothing Worsened by:  Nothing Ineffective treatments:  None tried   Past Medical History  Diagnosis Date  . Hypertension   . Headache(784.0)   . Hypercholesteremia   . Ulcer    Past Surgical History  Procedure Laterality Date  . Tubal ligation    . Uterine fibroid surgery     No family history on file. Social History  Substance Use Topics  . Smoking status: Current Every Day Smoker -- 0.50 packs/day    Types: Cigarettes  . Smokeless tobacco: None  . Alcohol Use: No   OB History    No data available     Review of Systems  Neurological: Positive for dizziness.  All other systems reviewed and are negative.     Allergies  Review of patient's allergies indicates no known allergies.  Home Medications   Prior to Admission medications   Medication Sig Start Date End Date Taking? Authorizing Provider  IRON PO Take 1 tablet by mouth daily.    Historical Provider, MD  lisinopril-hydrochlorothiazide (PRINZIDE,ZESTORETIC) 20-25 MG per tablet Take  1 tablet by mouth daily. 05/03/13   Charlann Lange, PA-C  Multiple Vitamin (MULTIVITAMIN WITH MINERALS) TABS Take 1 tablet by mouth daily.    Historical Provider, MD  omeprazole (PRILOSEC) 20 MG capsule Take 1 capsule (20 mg total) by mouth daily. 05/03/13   Charlann Lange, PA-C  permethrin (ELIMITE) 5 % cream Apply to affected area once at night from neck down, wash off in the morning x one application. May repeat in 1 week with persistent itching or new rash. 05/03/13   Charlann Lange, PA-C  simvastatin (ZOCOR) 20 MG tablet Take 1 tablet (20 mg total) by mouth daily. 05/03/13   Shari Upstill, PA-C   BP 138/93 mmHg  Pulse 57  Temp(Src) 98.3 F (36.8 C) (Oral)  Resp 18  Ht 5\' 3"  (1.6 m)  Wt 153 lb (69.4 kg)  BMI 27.11 kg/m2  SpO2 100% Physical Exam  Constitutional: She is oriented to person, place, and time. She appears well-developed and well-nourished. No distress.  HENT:  Head: Normocephalic and atraumatic.  Mouth/Throat: Oropharynx is clear and moist.  Eyes: EOM are normal. Pupils are equal, round, and reactive to light.  Neck: Normal range of motion. Neck supple.  Cardiovascular: Normal rate and regular rhythm.  Exam reveals no gallop and no friction rub.   No murmur heard. Pulmonary/Chest: Effort normal and breath sounds normal. No respiratory distress. She has no wheezes.  Abdominal: Soft. Bowel sounds are normal. She exhibits no distension. There is no tenderness.  Musculoskeletal: Normal range of motion. She exhibits no edema.  Neurological:  She is alert and oriented to person, place, and time. No cranial nerve deficit. She exhibits normal muscle tone. Coordination normal.  Skin: Skin is warm and dry. She is not diaphoretic.  Nursing note and vitals reviewed.   ED Course  Procedures (including critical care time) Labs Review Labs Reviewed  COMPREHENSIVE METABOLIC PANEL  CBC WITH DIFFERENTIAL/PLATELET  TROPONIN I    Imaging Review No results found. I have personally  reviewed and evaluated these images and lab results as part of my medical decision-making.  ED ECG REPORT   Date: 08/04/2015  Rate: 50  Rhythm: sinus bradycardia  QRS Axis: normal  Intervals: normal  ST/T Wave abnormalities: normal  Conduction Disutrbances:none  Narrative Interpretation:   Old EKG Reviewed: none available  I have personally reviewed the EKG tracing and agree with the computerized printout as noted.   MDM   Final diagnoses:  None    Patient presents with complaints of dizziness and elevated blood pressures since running out of her blood pressure medication. She is currently between doctors and has no way of getting refills. Her physical examination is unremarkable as are her laboratory studies. Her blood pressures are not markedly elevated, however not ideal. I feel as though she is appropriate for discharge. I will refill her blood pressure medication and have her follow-up as needed.    Veryl Speak, MD 08/04/15 [phone]

## 2015-08-04 NOTE — Discharge Instructions (Signed)
Follow-up with your doctor for a recheck of your blood pressure in the next week.  Continue taking your lisinopril/HCTZ as prescribed. This has been refilled for you here today.   Dizziness Dizziness is a common problem. It is a feeling of unsteadiness or light-headedness. You may feel like you are about to faint. Dizziness can lead to injury if you stumble or fall. A person of any age group can suffer from dizziness, but dizziness is more common in older adults. CAUSES  Dizziness can be caused by many different things, including:  Middle ear problems.  Standing for too long.  Infections.  An allergic reaction.  Aging.  An emotional response to something, such as the sight of blood.  Side effects of medicines.  Tiredness.  Problems with circulation or blood pressure.  Excessive use of alcohol or medicines, or illegal drug use.  Breathing too fast (hyperventilation).  An irregular heart rhythm (arrhythmia).  A low red blood cell count (anemia).  Pregnancy.  Vomiting, diarrhea, fever, or other illnesses that cause body fluid loss (dehydration).  Diseases or conditions such as Parkinson's disease, high blood pressure (hypertension), diabetes, and thyroid problems.  Exposure to extreme heat. DIAGNOSIS  Your health care provider will ask about your symptoms, perform a physical exam, and perform an electrocardiogram (ECG) to record the electrical activity of your heart. Your health care provider may also perform other heart or blood tests to determine the cause of your dizziness. These may include:  Transthoracic echocardiogram (TTE). During echocardiography, sound waves are used to evaluate how blood flows through your heart.  Transesophageal echocardiogram (TEE).  Cardiac monitoring. This allows your health care provider to monitor your heart rate and rhythm in real time.  Holter monitor. This is a portable device that records your heartbeat and can help diagnose heart  arrhythmias. It allows your health care provider to track your heart activity for several days if needed.  Stress tests by exercise or by giving medicine that makes the heart beat faster. TREATMENT  Treatment of dizziness depends on the cause of your symptoms and can vary greatly. HOME CARE INSTRUCTIONS   Drink enough fluids to keep your urine clear or pale yellow. This is especially important in very hot weather. In older adults, it is also important in cold weather.  Take your medicine exactly as directed if your dizziness is caused by medicines. When taking blood pressure medicines, it is especially important to get up slowly.  Rise slowly from chairs and steady yourself until you feel okay.  In the morning, first sit up on the side of the bed. When you feel okay, stand slowly while holding onto something until you know your balance is fine.  Move your legs often if you need to stand in one place for a long time. Tighten and relax your muscles in your legs while standing.  Have someone stay with you for 1-2 days if dizziness continues to be a problem. Do this until you feel you are well enough to stay alone. Have the person call your health care provider if he or she notices changes in you that are concerning.  Do not drive or use heavy machinery if you feel dizzy.  Do not drink alcohol. SEEK IMMEDIATE MEDICAL CARE IF:   Your dizziness or light-headedness gets worse.  You feel nauseous or vomit.  You have problems talking, walking, or using your arms, hands, or legs.  You feel weak.  You are not thinking clearly or you have trouble  forming sentences. It may take a friend or family member to notice this.  You have chest pain, abdominal pain, shortness of breath, or sweating.  Your vision changes.  You notice any bleeding.  You have side effects from medicine that seems to be getting worse rather than better. MAKE SURE YOU:   Understand these instructions.  Will watch  your condition.  Will get help right away if you are not doing well or get worse. Document Released: 05/17/2001 Document Revised: 11/26/2013 Document Reviewed: 06/10/2011 [facility] Patient Information 2015 [place], [place]. This information is not intended to replace advice given to you by your health care provider. Make sure you discuss any questions you have with your health care provider.  Hypertension Hypertension, commonly called high blood pressure, is when the force of blood pumping through your arteries is too strong. Your arteries are the blood vessels that carry blood from your heart throughout your body. A blood pressure reading consists of a higher number over a lower number, such as 110/72. The higher number (systolic) is the pressure inside your arteries when your heart pumps. The lower number (diastolic) is the pressure inside your arteries when your heart relaxes. Ideally you want your blood pressure below 120/80. Hypertension forces your heart to work harder to pump blood. Your arteries may become narrow or stiff. Having hypertension puts you at risk for heart disease, stroke, and other problems.  RISK FACTORS Some risk factors for high blood pressure are controllable. Others are not.  Risk factors you cannot control include:   Race. You may be at higher risk if you are African American.  Age. Risk increases with age.  Gender. Men are at higher risk than women before age 86 years. After age 47, women are at higher risk than men. Risk factors you can control include:  Not getting enough exercise or physical activity.  Being overweight.  Getting too much fat, sugar, calories, or salt in your diet.  Drinking too much alcohol. SIGNS AND SYMPTOMS Hypertension does not usually cause signs or symptoms. Extremely high blood pressure (hypertensive crisis) may cause headache, anxiety, shortness of breath, and nosebleed. DIAGNOSIS  To check if you have hypertension, your health care  provider will measure your blood pressure while you are seated, with your arm held at the level of your heart. It should be measured at least twice using the same arm. Certain conditions can cause a difference in blood pressure between your right and left arms. A blood pressure reading that is higher than normal on one occasion does not mean that you need treatment. If one blood pressure reading is high, ask your health care provider about having it checked again. TREATMENT  Treating high blood pressure includes making lifestyle changes and possibly taking medicine. Living a healthy lifestyle can help lower high blood pressure. You may need to change some of your habits. Lifestyle changes may include:  Following the DASH diet. This diet is high in fruits, vegetables, and whole grains. It is low in salt, red meat, and added sugars.  Getting at least 2 hours of brisk physical activity every week.  Losing weight if necessary.  Not smoking.  Limiting alcoholic beverages.  Learning ways to reduce stress. If lifestyle changes are not enough to get your blood pressure under control, your health care provider may prescribe medicine. You may need to take more than one. Work closely with your health care provider to understand the risks and benefits. HOME CARE INSTRUCTIONS  Have your blood  pressure rechecked as directed by your health care provider.   Take medicines only as directed by your health care provider. Follow the directions carefully. Blood pressure medicines must be taken as prescribed. The medicine does not work as well when you skip doses. Skipping doses also puts you at risk for problems.   Do not smoke.   Monitor your blood pressure at home as directed by your health care provider. SEEK MEDICAL CARE IF:   You think you are having a reaction to medicines taken.  You have recurrent headaches or feel dizzy.  You have swelling in your ankles.  You have trouble with your  vision. SEEK IMMEDIATE MEDICAL CARE IF:  You develop a severe headache or confusion.  You have unusual weakness, numbness, or feel faint.  You have severe chest or abdominal pain.  You vomit repeatedly.  You have trouble breathing. MAKE SURE YOU:   Understand these instructions.  Will watch your condition.  Will get help right away if you are not doing well or get worse. Document Released: 11/21/2005 Document Revised: 04/07/2014 Document Reviewed: 09/13/2013 [facility] Patient Information 2015 [place], [place]. This information is not intended to replace advice given to you by your health care provider. Make sure you discuss any questions you have with your health care provider.   Emergency Department Resource Guide 1) Find a Doctor and Pay Out of Pocket Although you won't have to find out who is covered by your insurance plan, it is a good idea to ask around and get recommendations. You will then need to call the office and see if the doctor you have chosen will accept you as a new patient and what types of options they offer for patients who are self-pay. Some doctors offer discounts or will set up payment plans for their patients who do not have insurance, but you will need to ask so you aren't surprised when you get to your appointment.  2) Contact Your Local Health Department Not all health departments have doctors that can see patients for sick visits, but many do, so it is worth a call to see if yours does. If you don't know where your local health department is, you can check in your phone book. The CDC also has a tool to help you locate your state's health department, and many state websites also have listings of all of their local health departments.  3) Find a [facility] If your illness is not likely to be very severe or complicated, you may want to try a walk in clinic. These are popping up all over the country in pharmacies, drugstores, and shopping centers. They're  usually staffed by nurse practitioners or physician assistants that have been trained to treat common illnesses and complaints. They're usually fairly quick and inexpensive. However, if you have serious medical issues or chronic medical problems, these are probably not your best option.  No Primary Care Doctor: - Call Health Connect at  [phone] - they can help you locate a primary care doctor that  accepts your insurance, provides certain services, etc. - Physician Referral Service- [phone]  Chronic Pain Problems: Organization         Address  Phone   Notes  [facility]  [phone] Patients need to be referred by their primary care doctor.   Medication Assistance: Organization         Address  Phone   Notes  [facility] Medication Assistance Program [address], [place] [address] [phone] --Must be a resident of [facility] -- Must have NO insurance coverage whatsoever (no Medicaid/ Medicare, etc.) -- The pt. MUST have a primary care doctor that directs their care regularly and follows them in the community   MedAssist  [phone]   [employer]  [phone]    Agencies that provide inexpensive medical care: Organization         Address  Phone   Notes  [place]  [phone]   [facility]    [phone]   [facility] [place], [place] [address] [phone]   [place] [address] [place] [phone]   Planned Parenthood    [phone]   [facility]    [phone]   [place] [address], [place] Phone:  [phone], Fax:  [phone] Hours of Operation:  9 am - 6 pm, M-F.  Also accepts Medicaid/Medicare and self-pay.  [facility] for [place] [address], [address], [place] Phone: [phone], Fax: [phone]. Hours  of Operation:  8:30 am - 5:30 pm, M-F.  Also accepts Medicaid and self-pay.  [facility] [address] [place] Phone: [phone]   [place], [place], [place] [phone], Ext. [phone] Mondays & Thursdays: 7-9 AM.  First 15 patients are seen on a first come, first serve basis.    Phil Campbell Providers:  Organization         Address  Phone   Notes  [facility] [address], [place] [phone] Also accepts self-pay patients.  [facility] [address], [place]  [phone]   [place], [address], [place] [phone]   [facility] [address], [place] [phone]   Lucianne Lei [address], [place]   [phone] Only accepts [place] Access [place] patients after they have their name applied to their card.   Self-Pay (no insurance) in [facility]:  Organization         Address  Phone   Notes  Sickle Cell Patients, [facility] [place] [phone]   [facility] [place] [phone]   [facility] [place]  [place], [place], [place] [phone]   [facility]/Dr. Osei-Bonsu  [address], [place] or [address], [place] [phone] Phone number for both [place] and [place] locations is the same.  [facility] [address], [place] [phone]   [facility] [address] [place] or [address] [phone]   [facility] [address], [place] [phone], phone; [phone], fax Sees patients 1st and 3rd Saturday of every month.  Must not qualify for public or private insurance (i.e. Medicaid,  Medicare, [place] Health Choice, Veterans' Benefits)  Household income should be no more than 200% of the poverty level The clinic cannot treat you if you are pregnant or think you are pregnant  Sexually transmitted diseases are not treated at the clinic.    Dental Care:  Organization         Address  Phone  Notes  [facility] Department of [facility] [place] [phone] Accepts children up to age 59 who are enrolled in [place] or [place]; pregnant women with a Medicaid card; and children who have applied for Medicaid or [place] Health Choice, but were declined, whose parents can pay a reduced fee at time of service.  [facility] Department of [facility]  [address], [place] [phone] Accepts children up to age 27 who are enrolled in [place] or [place]; pregnant women with a Medicaid card; and children who have applied for Medicaid or [place] Health Choice, but were declined, whose parents can pay a reduced fee at time of service.  [place] Adult Dental Access PROGRAM  [place] [phone] Patients are seen by appointment only. Walk-ins are not accepted. [place] will see patients 70 years of age and older. Monday - Tuesday (8am-5pm) Most Wednesdays (8:30-5pm) $30 per visit, cash only  [facility] Adult Dental Access PROGRAM  [address], [facility] [phone] Patients are seen by appointment only. Walk-ins are not accepted. [place] will see patients 60 years of age and older. One Wednesday Evening (Monthly: Volunteer Based).  $30 per visit, cash only  [place]  [phone] for adults; Children under age 53, call Graduate Pediatric Dentistry at [phone]. Children aged 72-14, please call [phone] to request a pediatric application.  Dental services are provided in all areas of dental care including fillings, crowns  and bridges, complete and partial dentures, implants, gum treatment, root canals, and extractions. Preventive care is also provided. Treatment is provided to both adults and children. Patients are selected via a lottery and there is often a waiting list.   [facility] [address] [place]  [phone] [email or web address]   [facility] [address] [place], [place] [phone], Ext. [phone] Second and Fourth Thursday of each month, opens at 6:30 AM; Clinic ends at 9 AM.  Patients are seen on a first-come first-served basis, and a limited number are seen during each clinic.   [facility]  [address] [place] Danker [place], [place] [phone]   Eligibility Requirements You must have lived in [place], [place], or [place] counties for at least the last three months.   You cannot be eligible for state or federal sponsored [employer], including [employer], [place], or [employer].   You generally cannot be eligible for healthcare insurance through your employer.    How to apply: Eligibility screenings are held every Tuesday and Wednesday afternoon from 1:00 pm until 4:00 pm. You do not need an appointment for the interview!  [facility] [address], [place], [place]   [place]  [place] Department  [place]  [phone]    Behavioral Health Resources in the Community: Intensive Outpatient Programs Organization         Address  Phone  Notes  [place]. [address] [place], [place] [phone]   [facility] [address], [place], [place]   ADS: Alcohol [facility] Drug [facility] [address], [place], [place]   [place] [address]  [place], [place]  [phone] or [phone]   Substance Abuse  Resources Organization         Address  Phone  Notes  Alcohol and Drug Services  [place]  [phone]   [facility] [place]  ([phone]   Chinita Pester  [phone]   Residential & Outpatient Substance Abuse Program  [phone]   Psychological Services Organization         Address  Phone  Notes  [facility] [place]  [place]  [phone]   [place] [address], [place] or [phone]    Mobile Crisis Teams Organization         Address  Phone  Notes  [facility], Mobile Crisis Care Unit  [phone]   [facility] Psychotherapeutic [facility]  [address] [place], [place]   [place] Levels [address] [place] [phone]    Self-Help/Support Groups Organization         Address  Phone             Notes  [place]. of [place] - variety of support groups  [place] Call for more information  Narcotics Anonymous (NA), [facility] [address], [employer] [place]  2 meetings at this location   Special educational needs teacher         Address  Phone  Notes  [facility] Residential Treatment [place],    [place]  [phone]   [facility]  [address] [place] [id number], [place], [place]   [place], [place] [phone] Admissions: 8am-3pm M-F  Incentives Substance [place] [address]    [place], [place] [phone]   [facility] [address] [place], [place], [place]   [facility] [address]  [place], [place]   [facility] Programs - Intensive Outpatient [address], [place], [place]   [facility] [place].,  [place], [place] 1-4841196882 or [phone]   [facility] ([facility]) [address], [place], [place] Accepts Medicaid  [facility] [place] [address]  [place] [phone] Substance Abuse/Addiction Treatment   [facility] Organization         Address  Phone  Notes  [facility]  [phone]   Domenic Schwab, PhD [address] [place], [place]   [phone] or [phone]   [place], [place] [phone]   [facility] [address], [place], [place] [phone] Insurance/Medicaid/sponsorship through [facility] and Families [address] [place]                                    [place], [place] [phone] [place] [address], [place] [phone]    Dr. Adele Schilder  [phone]   [facility] Dept. 1) [address], [place] 2) [place] 3)  [place] [address], [place] [phone]  [phone]   [place] [phone] or [phone] (After Hours)

## 2015-11-07 ENCOUNTER — Emergency Department (HOSPITAL_BASED_OUTPATIENT_CLINIC_OR_DEPARTMENT_OTHER)
Admission: EM | Admit: 2015-11-07 | Discharge: 2015-11-07 | Disposition: A | Discharge status: Home / Self Care | Payer: 59 | Attending: Emergency Medicine | Admitting: Emergency Medicine

## 2015-11-07 ENCOUNTER — Encounter (HOSPITAL_BASED_OUTPATIENT_CLINIC_OR_DEPARTMENT_OTHER): Payer: Self-pay | Admitting: Emergency Medicine

## 2015-11-07 DIAGNOSIS — Z79899 Other long term (current) drug therapy: Secondary | ICD-10-CM | POA: Diagnosis not present

## 2015-11-07 DIAGNOSIS — I10 Essential (primary) hypertension: Secondary | ICD-10-CM | POA: Diagnosis not present

## 2015-11-07 DIAGNOSIS — F17210 Nicotine dependence, cigarettes, uncomplicated: Secondary | ICD-10-CM | POA: Diagnosis not present

## 2015-11-07 DIAGNOSIS — E876 Hypokalemia: Secondary | ICD-10-CM | POA: Diagnosis not present

## 2015-11-07 DIAGNOSIS — E875 Hyperkalemia: Secondary | ICD-10-CM | POA: Diagnosis not present

## 2015-11-07 DIAGNOSIS — Z8719 Personal history of other diseases of the digestive system: Secondary | ICD-10-CM | POA: Insufficient documentation

## 2015-11-07 DIAGNOSIS — R51 Headache: Secondary | ICD-10-CM | POA: Diagnosis present

## 2015-11-07 DIAGNOSIS — E7800 Pure hypercholesterolemia, unspecified: Secondary | ICD-10-CM | POA: Insufficient documentation

## 2015-11-07 LAB — BASIC METABOLIC PANEL
ANION GAP: 6 (ref 5–15)
BUN: 14 mg/dL (ref 6–20)
CALCIUM: 8.5 mg/dL — AB (ref 8.9–10.3)
CO2: 26 mmol/L (ref 22–32)
Chloride: 107 mmol/L (ref 101–111)
Creatinine, Ser: 0.87 mg/dL (ref 0.44–1.00)
Glucose, Bld: 94 mg/dL (ref 65–99)
Potassium: 2.9 mmol/L — ABNORMAL LOW (ref 3.5–5.1)
SODIUM: 139 mmol/L (ref 135–145)

## 2015-11-07 MED ORDER — HYDRALAZINE HCL 20 MG/ML IJ SOLN
10.0000 mg | Freq: Once | INTRAMUSCULAR | Status: AC
  Administered 2015-11-07: 10 mg via INTRAVENOUS
  Filled 2015-11-07: qty 1

## 2015-11-07 MED ORDER — OMEPRAZOLE 20 MG PO CPDR: 20.0000 mg | DELAYED_RELEASE_CAPSULE | Freq: Every day | ORAL | Status: DC

## 2015-11-07 MED ORDER — LISINOPRIL-HYDROCHLOROTHIAZIDE 20-25 MG PO TABS: 1.0000 | ORAL_TABLET | Freq: Every day | ORAL | Status: DC

## 2015-11-07 MED ORDER — POTASSIUM CHLORIDE CRYS ER 20 MEQ PO TBCR: 20.0000 meq | EXTENDED_RELEASE_TABLET | Freq: Every day | ORAL | Status: DC

## 2015-11-07 MED ORDER — SIMVASTATIN 20 MG PO TABS: 20.0000 mg | ORAL_TABLET | Freq: Every day | ORAL | Status: DC

## 2015-11-07 NOTE — ED Provider Notes (Signed)
CSN: [id number]     Arrival date & time 11/07/15  0025 History   First MD Initiated Contact with Patient 11/07/15 0036     Chief Complaint  Patient presents with  . Headache     (Consider location/radiation/quality/duration/timing/severity/associated sxs/prior Treatment) HPI Comments: Patient presents to the ER for evaluation of intermittent headache. Patient reports that she has been experiencing intermittent bifrontal headache for the last 2 weeks. She reports that the headache comes and goes, has not identified exacerbating or alleviating factors. Patient reports that her headache is not currently present. She is not any speech difficulty, facial drooping, numbness, tingling or weakness of her extremities. There is no neck pain or stiffness. She has not had a fever.  Does report a previous history of headaches, at times severe. The headache she has been treating this week is mild compared to the worst headaches she has had.  Patient is a 54 y.o. female presenting with headaches.  Headache   Past Medical History  Diagnosis Date  . Hypertension   . Headache(784.0)   . Hypercholesteremia   . Ulcer    Past Surgical History  Procedure Laterality Date  . Tubal ligation    . Uterine fibroid surgery     No family history on file. Social History  Substance Use Topics  . Smoking status: Current Every Day Smoker -- 0.50 packs/day    Types: Cigarettes  . Smokeless tobacco: None  . Alcohol Use: No   OB History    No data available     Review of Systems  Respiratory: Negative for shortness of breath.   Cardiovascular: Negative for chest pain.  Neurological: Positive for headaches.  All other systems reviewed and are negative.     Allergies  Review of patient's allergies indicates no known allergies.  Home Medications   Prior to Admission medications   Medication Sig Start Date End Date Taking? Authorizing Provider  IRON PO Take 1 tablet by mouth daily.    Historical  Provider, MD  lisinopril-hydrochlorothiazide (ZESTORETIC) 20-25 MG tablet Take 1 tablet by mouth daily. 11/07/15   Orpah Greek, MD  Multiple Vitamin (MULTIVITAMIN WITH MINERALS) TABS Take 1 tablet by mouth daily.    Historical Provider, MD  omeprazole (PRILOSEC) 20 MG capsule Take 1 capsule (20 mg total) by mouth daily. 11/07/15   Orpah Greek, MD  permethrin (ELIMITE) 5 % cream Apply to affected area once at night from neck down, wash off in the morning x one application. May repeat in 1 week with persistent itching or new rash. 05/03/13   Charlann Lange, PA-C  potassium chloride SA (K-DUR,KLOR-CON) 20 MEQ tablet Take 1 tablet (20 mEq total) by mouth daily. 11/07/15   Orpah Greek, MD  simvastatin (ZOCOR) 20 MG tablet Take 1 tablet (20 mg total) by mouth daily. 11/07/15   Orpah Greek, MD   BP 136/78 mmHg  Pulse 77  Temp(Src) 97.7 F (36.5 C) (Oral)  Resp 18  Ht 5\' 3"  (1.6 m)  Wt 154 lb (69.854 kg)  BMI 27.29 kg/m2  SpO2 99% Physical Exam  Constitutional: She is oriented to person, place, and time. She appears well-developed and well-nourished. No distress.  HENT:  Head: Normocephalic and atraumatic.  Right Ear: Hearing normal.  Left Ear: Hearing normal.  Nose: Nose normal.  Mouth/Throat: Oropharynx is clear and moist and mucous membranes are normal.  Eyes: Conjunctivae and EOM are normal. Pupils are equal, round, and reactive to light.  Neck: Normal range of  motion. Neck supple.  Cardiovascular: Regular rhythm, S1 normal and S2 normal.  Exam reveals no gallop and no friction rub.   No murmur heard. Pulmonary/Chest: Effort normal and breath sounds normal. No respiratory distress. She exhibits no tenderness.  Abdominal: Soft. Normal appearance and bowel sounds are normal. There is no hepatosplenomegaly. There is no tenderness. There is no rebound, no guarding, no tenderness at McBurney's point and negative Murphy's sign. No hernia.  Musculoskeletal:  Normal range of motion.  Neurological: She is alert and oriented to person, place, and time. She has normal strength. No cranial nerve deficit or sensory deficit. Coordination normal. GCS eye subscore is 4. GCS verbal subscore is 5. GCS motor subscore is 6.  Skin: Skin is warm, dry and intact. No rash noted. No cyanosis.  Psychiatric: She has a normal mood and affect. Her speech is normal and behavior is normal. Thought content normal.  Nursing note and vitals reviewed.   ED Course  Procedures (including critical care time) Labs Review Labs Reviewed  BASIC METABOLIC PANEL - Abnormal; Notable for the following:    Potassium 2.9 (*)    Calcium 8.5 (*)    All other components within normal limits    Imaging Review No results found. I have personally reviewed and evaluated these images and lab results as part of my medical decision-making.   EKG Interpretation None      MDM   Final diagnoses:  Hyperkalemia  Hypokalemia    Patient has been spared to intermittent headache for the last 2 weeks. She does have a history of migraine headaches, chronic headaches as well as hypertension. She does feel that she has had similar headaches with elevated blood pressure in the past. She has been out of her medications, has not taken any for the last 2 weeks. Blood pressure was very elevated at 186/116 arrival. Her headache has resolved prior to arrival. She has normal neurologic function, no concern for stroke or hemorrhage, does not require imaging. Basic metabolic panel was performed. She does not have any renal insufficiency.She does have mild hypokalemia. This has been present in the past. Because she will be placed back on her hydrochlorothiazide, was given a seven-day supply and told to increase her potassium intake and foods.  Patient has significantly improved with hydralazine. She is asymptomatic here. Sister reticulocyte was reinitiated. Was also given prescription for Prilosec and Zocor.  She has scheduled follow-up with any primary care physician.    Orpah Greek, MD 11/07/15 [phone]

## 2015-11-07 NOTE — Discharge Instructions (Signed)
Hypertension Hypertension, commonly called high blood pressure, is when the force of blood pumping through your arteries is too strong. Your arteries are the blood vessels that carry blood from your heart throughout your body. A blood pressure reading consists of a higher number over a lower number, such as 110/72. The higher number (systolic) is the pressure inside your arteries when your heart pumps. The lower number (diastolic) is the pressure inside your arteries when your heart relaxes. Ideally you want your blood pressure below 120/80. Hypertension forces your heart to work harder to pump blood. Your arteries may become narrow or stiff. Having untreated or uncontrolled hypertension can cause heart attack, stroke, kidney disease, and other problems. RISK FACTORS Some risk factors for high blood pressure are controllable. Others are not.  Risk factors you cannot control include:   Race. You may be at higher risk if you are African American.  Age. Risk increases with age.  Gender. Men are at higher risk than women before age 45 years. After age 65, women are at higher risk than men. Risk factors you can control include:  Not getting enough exercise or physical activity.  Being overweight.  Getting too much fat, sugar, calories, or salt in your diet.  Drinking too much alcohol. SIGNS AND SYMPTOMS Hypertension does not usually cause signs or symptoms. Extremely high blood pressure (hypertensive crisis) may cause headache, anxiety, shortness of breath, and nosebleed. DIAGNOSIS To check if you have hypertension, your health care provider will measure your blood pressure while you are seated, with your arm held at the level of your heart. It should be measured at least twice using the same arm. Certain conditions can cause a difference in blood pressure between your right and left arms. A blood pressure reading that is higher than normal on one occasion does not mean that you need treatment. If  it is not clear whether you have high blood pressure, you may be asked to return on a different day to have your blood pressure checked again. Or, you may be asked to monitor your blood pressure at home for 1 or more weeks. TREATMENT Treating high blood pressure includes making lifestyle changes and possibly taking medicine. Living a healthy lifestyle can help lower high blood pressure. You may need to change some of your habits. Lifestyle changes may include:  Following the DASH diet. This diet is high in fruits, vegetables, and whole grains. It is low in salt, red meat, and added sugars.  Keep your sodium intake below 2,300 mg per day.  Getting at least 30-45 minutes of aerobic exercise at least 4 times per week.  Losing weight if necessary.  Not smoking.  Limiting alcoholic beverages.  Learning ways to reduce stress. Your health care provider may prescribe medicine if lifestyle changes are not enough to get your blood pressure under control, and if one of the following is true:  You are 18-59 years of age and your systolic blood pressure is above 140.  You are 60 years of age or older, and your systolic blood pressure is above 150.  Your diastolic blood pressure is above 90.  You have diabetes, and your systolic blood pressure is over 140 or your diastolic blood pressure is over 90.  You have kidney disease and your blood pressure is above 140/90.  You have heart disease and your blood pressure is above 140/90. Your personal target blood pressure may vary depending on your medical conditions, your age, and other factors. HOME CARE INSTRUCTIONS    Have your blood pressure rechecked as directed by your health care provider.   Take medicines only as directed by your health care provider. Follow the directions carefully. Blood pressure medicines must be taken as prescribed. The medicine does not work as well when you skip doses. Skipping doses also puts you at risk for  problems.  Do not smoke.   Monitor your blood pressure at home as directed by your health care provider. SEEK MEDICAL CARE IF:   You think you are having a reaction to medicines taken.  You have recurrent headaches or feel dizzy.  You have swelling in your ankles.  You have trouble with your vision. SEEK IMMEDIATE MEDICAL CARE IF:  You develop a severe headache or confusion.  You have unusual weakness, numbness, or feel faint.  You have severe chest or abdominal pain.  You vomit repeatedly.  You have trouble breathing. MAKE SURE YOU:   Understand these instructions.  Will watch your condition.  Will get help right away if you are not doing well or get worse.   This information is not intended to replace advice given to you by your health care provider. Make sure you discuss any questions you have with your health care provider.   Document Released: 11/21/2005 Document Revised: 04/07/2015 Document Reviewed: 09/13/2013 Elsevier Interactive Patient Education 2016 [employer]  

## 2015-11-07 NOTE — ED Notes (Signed)
Headache intermittently for days. Has not had BP med filled in two weeks

## 2016-02-24 DIAGNOSIS — M545 Low back pain, unspecified: Secondary | ICD-10-CM | POA: Insufficient documentation

## 2016-06-12 ENCOUNTER — Emergency Department (HOSPITAL_BASED_OUTPATIENT_CLINIC_OR_DEPARTMENT_OTHER)
Admission: EM | Admit: 2016-06-12 | Discharge: 2016-06-12 | Disposition: A | Discharge status: Home / Self Care | Payer: BLUE CROSS/BLUE SHIELD | Attending: Emergency Medicine | Admitting: Emergency Medicine

## 2016-06-12 ENCOUNTER — Encounter (HOSPITAL_BASED_OUTPATIENT_CLINIC_OR_DEPARTMENT_OTHER): Payer: Self-pay | Admitting: Emergency Medicine

## 2016-06-12 ENCOUNTER — Emergency Department (HOSPITAL_BASED_OUTPATIENT_CLINIC_OR_DEPARTMENT_OTHER): Payer: BLUE CROSS/BLUE SHIELD

## 2016-06-12 DIAGNOSIS — R42 Dizziness and giddiness: Secondary | ICD-10-CM | POA: Insufficient documentation

## 2016-06-12 DIAGNOSIS — E0591 Thyrotoxicosis, unspecified with thyrotoxic crisis or storm: Secondary | ICD-10-CM | POA: Insufficient documentation

## 2016-06-12 DIAGNOSIS — Z79899 Other long term (current) drug therapy: Secondary | ICD-10-CM | POA: Diagnosis not present

## 2016-06-12 DIAGNOSIS — I10 Essential (primary) hypertension: Secondary | ICD-10-CM | POA: Insufficient documentation

## 2016-06-12 DIAGNOSIS — E782 Mixed hyperlipidemia: Secondary | ICD-10-CM | POA: Diagnosis not present

## 2016-06-12 DIAGNOSIS — E876 Hypokalemia: Secondary | ICD-10-CM | POA: Insufficient documentation

## 2016-06-12 DIAGNOSIS — F17210 Nicotine dependence, cigarettes, uncomplicated: Secondary | ICD-10-CM | POA: Insufficient documentation

## 2016-06-12 DIAGNOSIS — R5383 Other fatigue: Secondary | ICD-10-CM | POA: Diagnosis present

## 2016-06-12 DIAGNOSIS — E0590 Thyrotoxicosis, unspecified without thyrotoxic crisis or storm: Secondary | ICD-10-CM

## 2016-06-12 DIAGNOSIS — J189 Pneumonia, unspecified organism: Secondary | ICD-10-CM | POA: Diagnosis not present

## 2016-06-12 DIAGNOSIS — E860 Dehydration: Secondary | ICD-10-CM

## 2016-06-12 LAB — COMPREHENSIVE METABOLIC PANEL
ALT: 16 U/L (ref 14–54)
AST: 18 U/L (ref 15–41)
Albumin: 3.7 g/dL (ref 3.5–5.0)
Alkaline Phosphatase: 87 U/L (ref 38–126)
Anion gap: 8 (ref 5–15)
BILIRUBIN TOTAL: 0.6 mg/dL (ref 0.3–1.2)
BUN: 14 mg/dL (ref 6–20)
CO2: 27 mmol/L (ref 22–32)
CREATININE: 1.03 mg/dL — AB (ref 0.44–1.00)
Calcium: 8.7 mg/dL — ABNORMAL LOW (ref 8.9–10.3)
Chloride: 105 mmol/L (ref 101–111)
Glucose, Bld: 92 mg/dL (ref 65–99)
Potassium: 2.9 mmol/L — ABNORMAL LOW (ref 3.5–5.1)
Sodium: 140 mmol/L (ref 135–145)
TOTAL PROTEIN: 7 g/dL (ref 6.5–8.1)

## 2016-06-12 LAB — CBC WITH DIFFERENTIAL/PLATELET
BASOS ABS: 0 10*3/uL (ref 0.0–0.1)
Basophils Relative: 0 %
Eosinophils Absolute: 0.1 10*3/uL (ref 0.0–0.7)
Eosinophils Relative: 2 %
HEMATOCRIT: 36.9 % (ref 36.0–46.0)
Hemoglobin: 12.5 g/dL (ref 12.0–15.0)
LYMPHS ABS: 2.1 10*3/uL (ref 0.7–4.0)
LYMPHS PCT: 34 %
MCH: 27.9 pg (ref 26.0–34.0)
MCHC: 33.9 g/dL (ref 30.0–36.0)
MCV: 82.4 fL (ref 78.0–100.0)
MONO ABS: 0.6 10*3/uL (ref 0.1–1.0)
Monocytes Relative: 11 %
NEUTROS ABS: 3.3 10*3/uL (ref 1.7–7.7)
Neutrophils Relative %: 53 %
Platelets: 179 10*3/uL (ref 150–400)
RBC: 4.48 MIL/uL (ref 3.87–5.11)
RDW: 13.6 % (ref 11.5–15.5)
WBC: 6.1 10*3/uL (ref 4.0–10.5)

## 2016-06-12 LAB — TSH: TSH: 0.215 u[IU]/mL — AB (ref 0.350–4.500)

## 2016-06-12 LAB — D-DIMER, QUANTITATIVE: D-Dimer, Quant: <0.27 ug/mL-FEU (ref 0.00–0.50)

## 2016-06-12 LAB — TROPONIN I: Troponin I: <0.03 ng/mL (ref <0.03)

## 2016-06-12 MED ORDER — POTASSIUM CHLORIDE CRYS ER 20 MEQ PO TBCR
40.0000 meq | EXTENDED_RELEASE_TABLET | Freq: Once | ORAL | Status: AC
  Administered 2016-06-12: 40 meq via ORAL
  Filled 2016-06-12: qty 2

## 2016-06-12 MED ORDER — DOXYCYCLINE HYCLATE 100 MG PO TABS: 100.0000 mg | ORAL_TABLET | Freq: Two times a day (BID) | ORAL | Status: DC

## 2016-06-12 MED ORDER — SODIUM CHLORIDE 0.9 % IV BOLUS (SEPSIS)
1000.0000 mL | Freq: Once | INTRAVENOUS | Status: AC
  Administered 2016-06-12: 1000 mL via INTRAVENOUS

## 2016-06-12 MED ORDER — POTASSIUM CHLORIDE CRYS ER 20 MEQ PO TBCR: 40.0000 meq | EXTENDED_RELEASE_TABLET | Freq: Two times a day (BID) | ORAL | Status: DC

## 2016-06-12 MED ORDER — POTASSIUM CHLORIDE 10 MEQ/100ML IV SOLN
10.0000 meq | INTRAVENOUS | Status: AC
  Administered 2016-06-12 (×2): 10 meq via INTRAVENOUS
  Filled 2016-06-12 (×2): qty 100

## 2016-06-12 MED ORDER — DOXYCYCLINE HYCLATE 100 MG PO TABS
100.0000 mg | ORAL_TABLET | Freq: Once | ORAL | Status: AC
Start: 2016-06-12 — End: 2016-06-12
  Administered 2016-06-12: 100 mg via ORAL
  Filled 2016-06-12: qty 1

## 2016-06-12 NOTE — ED Provider Notes (Signed)
CSN: [id number]     Arrival date & time 06/12/16  1437 History   By signing my name below, I, Hansel Feinstein, attest that this documentation has been prepared under the direction and in the presence of Merrily Pew, MD. Electronically Signed: Hansel Feinstein, ED Scribe. 06/12/2016. 3:46 PM.     Chief Complaint  Patient presents with  . Fatigue   The history is provided by the patient. No language interpreter was used.    HPI Comments: Frances Conley is a 55 y.o. female with h/o HTN who presents to the Emergency Department complaining of moderate, waxing and waning dizziness onset yesterday with associated HA. She describes her dizziness as a "fogginess". Pt also complains of generalized fatigue, which she attributes to only sleeping 4-5 hours per night as a result of getting a second job 2 weeks ago. No worsening factors noted. She reports no new changes in daily medications. Pt reports h/o more severe dizziness 4 months ago resulting in syncope, which required hospitalization. She denies syncope, additional complains.   Past Medical History  Diagnosis Date  . Hypertension   . Headache(784.0)   . Hypercholesteremia   . Ulcer    Past Surgical History  Procedure Laterality Date  . Tubal ligation    . Uterine fibroid surgery     History reviewed. No pertinent family history. Social History  Substance Use Topics  . Smoking status: Current Every Day Smoker -- 0.50 packs/day    Types: Cigarettes  . Smokeless tobacco: None  . Alcohol Use: No   OB History    No data available     Review of Systems  Constitutional: Positive for fatigue.  Neurological: Positive for dizziness and headaches. Negative for syncope.  All other systems reviewed and are negative.  Allergies  Review of patient's allergies indicates no known allergies.  Home Medications   Prior to Admission medications   Medication Sig Start Date End Date Taking? Authorizing Provider  doxycycline (VIBRA-TABS) 100 MG tablet Take 1  tablet (100 mg total) by mouth 2 (two) times daily. 06/12/16   Merrily Pew, MD  IRON PO Take 1 tablet by mouth daily.    Historical Provider, MD  lisinopril-hydrochlorothiazide (ZESTORETIC) 20-25 MG tablet Take 1 tablet by mouth daily. 11/07/15   Orpah Greek, MD  Multiple Vitamin (MULTIVITAMIN WITH MINERALS) TABS Take 1 tablet by mouth daily.    Historical Provider, MD  omeprazole (PRILOSEC) 20 MG capsule Take 1 capsule (20 mg total) by mouth daily. 11/07/15   Orpah Greek, MD  permethrin (ELIMITE) 5 % cream Apply to affected area once at night from neck down, wash off in the morning x one application. May repeat in 1 week with persistent itching or new rash. 05/03/13   Charlann Lange, PA-C  potassium chloride SA (K-DUR,KLOR-CON) 20 MEQ tablet Take 2 tablets (40 mEq total) by mouth 2 (two) times daily. 06/12/16   Merrily Pew, MD  simvastatin (ZOCOR) 20 MG tablet Take 1 tablet (20 mg total) by mouth daily. 11/07/15   Orpah Greek, MD   BP 115/72 mmHg  Pulse 75  Temp(Src) 98.4 F (36.9 C) (Oral)  Resp 28  Ht 5\' 3"  (1.6 m)  Wt 162 lb (73.483 kg)  BMI 28.70 kg/m2  SpO2 97% Physical Exam  Constitutional: She is oriented to person, place, and time. She appears well-developed and well-nourished.  HENT:  Head: Normocephalic.  Eyes: Conjunctivae are normal.  Cardiovascular: Normal rate, regular rhythm and normal heart sounds.  Exam  reveals no gallop and no friction rub.   No murmur heard. Pulmonary/Chest: Effort normal and breath sounds normal. No respiratory distress. She has no wheezes. She has no rales.  Lungs CTA bilaterally.   Abdominal: She exhibits no distension.  Musculoskeletal: Normal range of motion.  Neurological: She is alert and oriented to person, place, and time. No cranial nerve deficit.  Cranial nerves 2-12 grossly intact. Strength and sensation equal and intact to bilateral upper and lower extremities.   Skin: Skin is warm and dry.  Psychiatric: She has  a normal mood and affect. Her behavior is normal.  Nursing note and vitals reviewed.   ED Course  Procedures (including critical care time) DIAGNOSTIC STUDIES: Oxygen Saturation is 100% on RA, normal by my interpretation.    COORDINATION OF CARE: 3:44 PM Discussed treatment plan with pt at bedside which includes lab work and pt agreed to plan.   Labs Review Labs Reviewed  COMPREHENSIVE METABOLIC PANEL - Abnormal; Notable for the following:    Potassium 2.9 (*)    Creatinine, Ser 1.03 (*)    Calcium 8.7 (*)    All other components within normal limits  TSH - Abnormal; Notable for the following:    TSH 0.215 (*)    All other components within normal limits  CBC WITH DIFFERENTIAL/PLATELET  D-DIMER, QUANTITATIVE (NOT AT [facility])  TROPONIN I    Imaging Review Dg Chest 2 View  06/12/2016  CLINICAL DATA:  Left-sided chest pain and dizziness since yesterday. EXAM: CHEST  2 VIEW COMPARISON:  Chest CT 04/29/2014 FINDINGS: The cardiac silhouette, mediastinal and hilar contours are normal in stable. Increased density at the left lung base suspicious for pneumonia. No pleural effusion. IMPRESSION: Suspect left lower lobe infiltrate. Electronically Signed   By: Marijo Sanes M.D.   On: 06/12/2016 18:18   I have personally reviewed and evaluated these images and lab results as part of my medical decision-making.   EKG Interpretation   Date/Time:  Sunday June 12 2016 15:55:09 EDT Ventricular Rate:  51 PR Interval:    QRS Duration: 103 QT Interval:  440 QTC Calculation: 406 R Axis:   55 Text Interpretation:  Sinus rhythm Confirmed by Zanaya Baize MD, Corene Cornea [phone])  on 06/12/2016 8:26:25 PM      MDM   Final diagnoses:  Dehydration  Other fatigue  Hypokalemia  Hyperthyroidism  Community acquired pneumonia    I suspect the likely cause her symptoms is fatigue. She has a normal neurologic exam. His negative troponin and negative d-dimer negative EKG. suspect possible pneumonia on her chest  x-ray and on reevaluation patient does state that she has had some cough recently so I treated her with antibiotics for that. She does have a slightly low potassium as well. Her TSH was slightly low suggesting possible hyperthyroidism she will follow with her doctor for this as this is been known and has been being worked on in the past. She also follow-up for repeat potassium check and to ensure that her pneumonia is improving.  New Prescriptions: Discharge Medication List as of 06/12/2016  9:44 PM    START taking these medications   Details  doxycycline (VIBRA-TABS) 100 MG tablet Take 1 tablet (100 mg total) by mouth 2 (two) times daily., Starting 06/12/2016, Until Discontinued, Print        I have personally and contemperaneously reviewed labs and imaging and used in my decision making as above.   A medical screening exam was performed and I feel the patient has  had an appropriate workup for their chief complaint at this time and likelihood of emergent condition existing is low and thus workup can continue on an outpatient basis.. Their vital signs are stable. They have been counseled on decision, discharge, follow up and which symptoms necessitate immediate return to the emergency department.  They verbally stated understanding and agreement with plan and discharged in stable condition.    I personally performed the services described in this documentation, which was scribed in my presence. The recorded information has been reviewed and is accurate.    Merrily Pew, MD 06/12/16 [phone]

## 2016-06-12 NOTE — ED Notes (Signed)
MD at bedside for pt eval.

## 2016-06-12 NOTE — ED Notes (Signed)
Pt also c/o back pain and fatigue.

## 2016-06-12 NOTE — ED Notes (Signed)
Patient transported to X-ray 

## 2016-06-12 NOTE — ED Notes (Signed)
Pt states she has been feeling lightheaded since yesterday, hx of hypertension and takes BP med. Ambulatory in NAD.

## 2016-07-25 DIAGNOSIS — R7989 Other specified abnormal findings of blood chemistry: Secondary | ICD-10-CM | POA: Insufficient documentation

## 2016-09-23 DIAGNOSIS — K219 Gastro-esophageal reflux disease without esophagitis: Secondary | ICD-10-CM | POA: Insufficient documentation

## 2016-09-23 DIAGNOSIS — E782 Mixed hyperlipidemia: Secondary | ICD-10-CM | POA: Insufficient documentation

## 2016-09-23 DIAGNOSIS — M159 Polyosteoarthritis, unspecified: Secondary | ICD-10-CM | POA: Insufficient documentation

## 2016-09-23 DIAGNOSIS — I10 Essential (primary) hypertension: Secondary | ICD-10-CM | POA: Insufficient documentation

## 2016-09-23 DIAGNOSIS — E559 Vitamin D deficiency, unspecified: Secondary | ICD-10-CM | POA: Insufficient documentation

## 2016-10-04 DIAGNOSIS — R221 Localized swelling, mass and lump, neck: Secondary | ICD-10-CM | POA: Insufficient documentation

## 2016-10-04 DIAGNOSIS — E049 Nontoxic goiter, unspecified: Secondary | ICD-10-CM | POA: Insufficient documentation

## 2016-11-17 DIAGNOSIS — Q182 Other branchial cleft malformations: Secondary | ICD-10-CM | POA: Insufficient documentation

## 2018-03-17 ENCOUNTER — Emergency Department (HOSPITAL_COMMUNITY): Payer: Self-pay

## 2018-03-17 ENCOUNTER — Encounter (HOSPITAL_COMMUNITY): Payer: Self-pay | Admitting: Emergency Medicine

## 2018-03-17 ENCOUNTER — Other Ambulatory Visit: Payer: Self-pay

## 2018-03-17 ENCOUNTER — Emergency Department (HOSPITAL_COMMUNITY)
Admission: EM | Admit: 2018-03-17 | Discharge: 2018-03-17 | Disposition: A | Discharge status: Home / Self Care | Payer: Self-pay | Attending: Emergency Medicine | Admitting: Emergency Medicine

## 2018-03-17 DIAGNOSIS — E876 Hypokalemia: Secondary | ICD-10-CM

## 2018-03-17 DIAGNOSIS — R519 Headache, unspecified: Secondary | ICD-10-CM

## 2018-03-17 DIAGNOSIS — J302 Other seasonal allergic rhinitis: Secondary | ICD-10-CM

## 2018-03-17 DIAGNOSIS — I10 Essential (primary) hypertension: Secondary | ICD-10-CM | POA: Insufficient documentation

## 2018-03-17 DIAGNOSIS — E7800 Pure hypercholesterolemia, unspecified: Secondary | ICD-10-CM | POA: Insufficient documentation

## 2018-03-17 DIAGNOSIS — Z79899 Other long term (current) drug therapy: Secondary | ICD-10-CM | POA: Insufficient documentation

## 2018-03-17 DIAGNOSIS — R51 Headache: Secondary | ICD-10-CM | POA: Insufficient documentation

## 2018-03-17 DIAGNOSIS — Z87891 Personal history of nicotine dependence: Secondary | ICD-10-CM | POA: Insufficient documentation

## 2018-03-17 LAB — BASIC METABOLIC PANEL
Anion gap: 13 (ref 5–15)
BUN: 10 mg/dL (ref 6–20)
CO2: 22 mmol/L (ref 22–32)
CREATININE: 0.93 mg/dL (ref 0.44–1.00)
Calcium: 8.9 mg/dL (ref 8.9–10.3)
Chloride: 102 mmol/L (ref 101–111)
GFR calc Af Amer: >60 mL/min (ref >60)
GLUCOSE: 207 mg/dL — AB (ref 65–99)
POTASSIUM: 3 mmol/L — AB (ref 3.5–5.1)
Sodium: 137 mmol/L (ref 135–145)

## 2018-03-17 LAB — CBC
HEMATOCRIT: 39.7 % (ref 36.0–46.0)
Hemoglobin: 14 g/dL (ref 12.0–15.0)
MCH: 28.9 pg (ref 26.0–34.0)
MCHC: 35.3 g/dL (ref 30.0–36.0)
MCV: 81.9 fL (ref 78.0–100.0)
PLATELETS: 225 10*3/uL (ref 150–400)
RBC: 4.85 MIL/uL (ref 3.87–5.11)
RDW: 13.9 % (ref 11.5–15.5)
WBC: 11.4 10*3/uL — ABNORMAL HIGH (ref 4.0–10.5)

## 2018-03-17 LAB — SEDIMENTATION RATE: Sed Rate: 38 mm/hr — ABNORMAL HIGH (ref 0–22)

## 2018-03-17 LAB — I-STAT TROPONIN, ED: Troponin i, poc: 0.02 ng/mL (ref 0.00–0.08)

## 2018-03-17 LAB — C-REACTIVE PROTEIN: CRP: 1.5 mg/dL — ABNORMAL HIGH (ref <1.0)

## 2018-03-17 MED ORDER — ACETAMINOPHEN 325 MG PO TABS
650.0000 mg | ORAL_TABLET | Freq: Once | ORAL | Status: AC
  Administered 2018-03-17: 650 mg via ORAL
  Filled 2018-03-17: qty 2

## 2018-03-17 NOTE — Discharge Instructions (Addendum)
Follow-up with your providers as needed.  Return for any new or worse symptoms.  Headache may have been related to allergies glad that it has resolved.  Chest x-ray here today negative for pneumonia.  Take the allergy medicine Claritin as prescribed by your primary care provider.   Potassium slightly low today.  List of foods high in potassium provided.  Would follow-up with your primary care doctors in the next few weeks to have potassium rechecked.

## 2018-03-17 NOTE — ED Provider Notes (Signed)
[facility] EMERGENCY DEPARTMENT Provider Note   CSN: [id number] Arrival date & time: 03/17/18  0204     History   Chief Complaint Chief Complaint  Patient presents with  . Headache  . Jaw Pain    HPI Frances Conley is a 57 y.o. female.  Patient followed by primary care in the [facility] area.  Patient with recent visit for seasonal allergy started on Claritin.  And then just shortly prior to that seen for leg swelling bilateral.  Patient presented today with a complaint of headache in the forehead area that is now resolved.  Having congestion and cough.  Patient has had pneumonia in the past.  Also still with some persistent leg swelling.  No significant shortness of breath.  When she had a headache there was some discomfort that went into the jaw area.  Did not have any anterior chest pain.  Patient is on potassium supplementation.     Past Medical History:  Diagnosis Date  . Headache(784.0)   . Hypercholesteremia   . Hypertension   . Ulcer     There are no active problems to display for this patient.   Past Surgical History:  Procedure Laterality Date  . TUBAL LIGATION    . UTERINE FIBROID SURGERY       OB History   None      Home Medications    Prior to Admission medications   Medication Sig Start Date End Date Taking? Authorizing Provider  doxycycline (VIBRA-TABS) 100 MG tablet Take 1 tablet (100 mg total) by mouth 2 (two) times daily. 06/12/16   Mesner, Corene Cornea, MD  IRON PO Take 1 tablet by mouth daily.    [provider]  lisinopril-hydrochlorothiazide (ZESTORETIC) 20-25 MG tablet Take 1 tablet by mouth daily. 11/07/15   Orpah Greek, MD  Multiple Vitamin (MULTIVITAMIN WITH MINERALS) TABS Take 1 tablet by mouth daily.    [provider]  omeprazole (PRILOSEC) 20 MG capsule Take 1 capsule (20 mg total) by mouth daily. 11/07/15   Orpah Greek, MD  permethrin (ELIMITE) 5 % cream Apply to affected area once at  night from neck down, wash off in the morning x one application. May repeat in 1 week with persistent itching or new rash. 05/03/13   Charlann Lange, PA-C  potassium chloride SA (K-DUR,KLOR-CON) 20 MEQ tablet Take 2 tablets (40 mEq total) by mouth 2 (two) times daily. 06/12/16   Mesner, Corene Cornea, MD  simvastatin (ZOCOR) 20 MG tablet Take 1 tablet (20 mg total) by mouth daily. 11/07/15   Orpah Greek, MD    Family History No family history on file.  Social History Social History   Tobacco Use  . Smoking status: Former Smoker    Packs/day: 0.50    Types: Cigarettes  . Smokeless tobacco: Never Used  Substance Use Topics  . Alcohol use: No  . Drug use: Never     Allergies   Patient has no known allergies.   Review of Systems Review of Systems  Constitutional: Negative for fever.  HENT: Positive for congestion.   Respiratory: Positive for cough. Negative for shortness of breath.   Cardiovascular: Positive for leg swelling. Negative for chest pain and palpitations.  Gastrointestinal: Negative for abdominal pain.  Genitourinary: Negative for dysuria.  Musculoskeletal: Negative for myalgias and neck stiffness.  Allergic/Immunologic: Positive for environmental allergies.  Neurological: Negative for headaches.  Hematological: Does not bruise/bleed easily.  Psychiatric/Behavioral: Negative for confusion.  Physical Exam Updated Vital Signs BP 126/86 (BP Location: Right Arm)   Pulse 89   Temp 98.6 F (37 C) (Oral)   Resp 16   SpO2 100%   Physical Exam  Constitutional: She is oriented to person, place, and time. She appears well-developed and well-nourished. No distress.  HENT:  Head: Normocephalic and atraumatic.  Mouth/Throat: Oropharynx is clear and moist.  Eyes: Pupils are equal, round, and reactive to light. Conjunctivae and EOM are normal.  Neck: Normal range of motion. Neck supple.  Cardiovascular: Normal rate and regular rhythm.  Pulmonary/Chest: Effort  normal and breath sounds normal. She has no wheezes.  Abdominal: Soft. Bowel sounds are normal. There is no tenderness.  Musculoskeletal: Normal range of motion. She exhibits edema.  Neurological: She is alert and oriented to person, place, and time. No cranial nerve deficit or sensory deficit. She exhibits normal muscle tone. Coordination normal.  Skin: Skin is warm. No rash noted.  Nursing note and vitals reviewed.    ED Treatments / Results  Labs (all labs ordered are listed, but only abnormal results are displayed) Labs Reviewed  BASIC METABOLIC PANEL - Abnormal; Notable for the following components:      Result Value   Potassium 3.0 (*)    Glucose, Bld 207 (*)    All other components within normal limits  CBC - Abnormal; Notable for the following components:   WBC 11.4 (*)    All other components within normal limits  SEDIMENTATION RATE - Abnormal; Notable for the following components:   Sed Rate 38 (*)    All other components within normal limits  C-REACTIVE PROTEIN - Abnormal; Notable for the following components:   CRP 1.5 (*)    All other components within normal limits  I-STAT TROPONIN, ED    EKG EKG Interpretation  Date/Time:  Saturday March 17 2018 02:54:02 EDT Ventricular Rate:  82 PR Interval:  166 QRS Duration: 86 QT Interval:  394 QTC Calculation: 460 R Axis:   19 Text Interpretation:  Normal sinus rhythm Cannot rule out Anterior infarct , age undetermined Abnormal ECG No significant change since last tracing Confirmed by Fredia Sorrow [phone]) on 03/17/2018 7:18:13 AM   Radiology Dg Chest 2 View  Result Date: 03/17/2018 CLINICAL DATA:  Dry cough and congestion for 2-3 days. Former smoker. History of hypertension. EXAM: CHEST - 2 VIEW COMPARISON:  Chest x-ray dated 06/12/2016. Chest CTs dated 04/29/2014 and 03/27/2009. FINDINGS: Heart size is upper normal, stable. Overall cardiomediastinal silhouette is stable. Lungs are clear. No pleural effusion or  pneumothorax seen. No acute or suspicious osseous finding. Chest CT report of 03/27/2009 described a 2 mm pulmonary nodule within the RIGHT upper lobe. No nodule was subsequently identified on chest CT of 04/29/2014. IMPRESSION: No active cardiopulmonary disease. No evidence of pneumonia or pulmonary edema. Electronically Signed   By: Franki Cabot M.D.   On: 03/17/2018 08:26    Procedures Procedures (including critical care time)  Medications Ordered in ED Medications  acetaminophen (TYLENOL) tablet 650 mg (650 mg Oral Given 03/17/18 0521)     Initial Impression / Assessment and Plan / ED Course  I have reviewed the triage vital signs and the nursing notes.  Pertinent labs & imaging results that were available during my care of the patient were reviewed by me and considered in my medical decision making (see chart for details).    Patient symptoms have all resolved without any intervention here.  Patient had persistent cough so chest x-ray  was done to rule out pneumonia.  As mentioned above headache and jaw pain has resolved.  Patient is followed by primary care in the [facility] area.  Blood pressure was initially elevated now has normalized.  Patient nontoxic no acute distress.  Lungs are clear bilaterally no wheezing.  Chest nontender abdomen soft some bilateral leg swelling. Patient treated with Claritin for her seasonal allergies patient given a list of foods can high in potassium to help her with her low potassium she is currently on potassium supplementation.  Sed rate and  C-reactive protein ordered out in triage.  Slight elevation in sedimentation rate.  Not clear the significance of why these were ordered.  No history of any connective tissue disorder.  Will have patient just follow-up with primary.   Final Clinical Impressions(s) / ED Diagnoses   Final diagnoses:  Seasonal allergies  Headache above the eye region    ED Discharge Orders    None       Fredia Sorrow,  MD 03/17/18 [phone]

## 2018-03-17 NOTE — ED Notes (Signed)
Pt requesting tylenol for pain for headache

## 2018-03-17 NOTE — ED Notes (Signed)
Pt transported to Xray. 

## 2018-03-17 NOTE — ED Notes (Signed)
No answer for vital recheck 

## 2018-03-17 NOTE — ED Triage Notes (Addendum)
C/o headache since 9pm. No neuro deficits on triage exam.  Reports swelling to bilateral legs/ankles x 3 weeks.  States she is also having bilateral jaw pain that started tonight.  Seen by PCP on Friday for allergies and given steroid injection.

## 2019-02-22 ENCOUNTER — Encounter: Payer: Self-pay | Admitting: Emergency Medicine

## 2019-02-22 ENCOUNTER — Emergency Department
Admission: EM | Admit: 2019-02-22 | Discharge: 2019-02-22 | Disposition: A | Discharge status: Home / Self Care | Payer: Self-pay | Attending: Emergency Medicine | Admitting: Emergency Medicine

## 2019-02-22 ENCOUNTER — Emergency Department: Payer: Self-pay

## 2019-02-22 ENCOUNTER — Other Ambulatory Visit: Payer: Self-pay

## 2019-02-22 DIAGNOSIS — Z87891 Personal history of nicotine dependence: Secondary | ICD-10-CM | POA: Insufficient documentation

## 2019-02-22 DIAGNOSIS — R0602 Shortness of breath: Secondary | ICD-10-CM

## 2019-02-22 DIAGNOSIS — J029 Acute pharyngitis, unspecified: Secondary | ICD-10-CM

## 2019-02-22 DIAGNOSIS — I10 Essential (primary) hypertension: Secondary | ICD-10-CM | POA: Insufficient documentation

## 2019-02-22 DIAGNOSIS — R079 Chest pain, unspecified: Secondary | ICD-10-CM

## 2019-02-22 DIAGNOSIS — E876 Hypokalemia: Secondary | ICD-10-CM

## 2019-02-22 LAB — CBC WITH DIFFERENTIAL/PLATELET
Abs Immature Granulocytes: 0.02 10*3/uL (ref 0.00–0.07)
BASOS PCT: 1 %
Basophils Absolute: 0 10*3/uL (ref 0.0–0.1)
EOS ABS: 0.1 10*3/uL (ref 0.0–0.5)
EOS PCT: 2 %
HEMATOCRIT: 42.1 % (ref 36.0–46.0)
Hemoglobin: 13.8 g/dL (ref 12.0–15.0)
Immature Granulocytes: 0 %
Lymphocytes Relative: 39 %
Lymphs Abs: 2.3 10*3/uL (ref 0.7–4.0)
MCH: 26.6 pg (ref 26.0–34.0)
MCHC: 32.8 g/dL (ref 30.0–36.0)
MCV: 81.3 fL (ref 80.0–100.0)
Monocytes Absolute: 0.6 10*3/uL (ref 0.1–1.0)
Monocytes Relative: 10 %
NEUTROS PCT: 48 %
Neutro Abs: 2.8 10*3/uL (ref 1.7–7.7)
PLATELETS: 234 10*3/uL (ref 150–400)
RBC: 5.18 MIL/uL — ABNORMAL HIGH (ref 3.87–5.11)
RDW: 13.4 % (ref 11.5–15.5)
WBC: 5.9 10*3/uL (ref 4.0–10.5)
nRBC: 0 % (ref 0.0–0.2)

## 2019-02-22 LAB — CBC
HCT: 41.8 % (ref 36.0–46.0)
Hemoglobin: 13.8 g/dL (ref 12.0–15.0)
MCH: 26.8 pg (ref 26.0–34.0)
MCHC: 33 g/dL (ref 30.0–36.0)
MCV: 81.2 fL (ref 80.0–100.0)
NRBC: 0 % (ref 0.0–0.2)
PLATELETS: 230 10*3/uL (ref 150–400)
RBC: 5.15 MIL/uL — ABNORMAL HIGH (ref 3.87–5.11)
RDW: 13.3 % (ref 11.5–15.5)
WBC: 5.8 10*3/uL (ref 4.0–10.5)

## 2019-02-22 LAB — BASIC METABOLIC PANEL
Anion gap: 8 (ref 5–15)
BUN: 10 mg/dL (ref 6–20)
CALCIUM: 8.6 mg/dL — AB (ref 8.9–10.3)
CHLORIDE: 104 mmol/L (ref 98–111)
CO2: 28 mmol/L (ref 22–32)
CREATININE: 0.91 mg/dL (ref 0.44–1.00)
Glucose, Bld: 94 mg/dL (ref 70–99)
POTASSIUM: 2.9 mmol/L — AB (ref 3.5–5.1)
SODIUM: 140 mmol/L (ref 135–145)

## 2019-02-22 LAB — POCT PREGNANCY, URINE: Preg Test, Ur: NEGATIVE

## 2019-02-22 LAB — INFLUENZA PANEL BY PCR (TYPE A & B)
INFLAPCR: NEGATIVE
Influenza B By PCR: NEGATIVE

## 2019-02-22 LAB — GROUP A STREP BY PCR: GROUP A STREP BY PCR: NOT DETECTED

## 2019-02-22 LAB — TROPONIN I

## 2019-02-22 MED ORDER — POTASSIUM CHLORIDE CRYS ER 20 MEQ PO TBCR
40.0000 meq | EXTENDED_RELEASE_TABLET | Freq: Once | ORAL | Status: AC
Start: 2019-02-22 — End: 2019-02-22
  Administered 2019-02-22: 40 meq via ORAL
  Filled 2019-02-22: qty 2

## 2019-02-22 MED ORDER — POTASSIUM CHLORIDE CRYS ER 20 MEQ PO TBCR: 40.0000 meq | EXTENDED_RELEASE_TABLET | Freq: Two times a day (BID) | ORAL | 0 refills | Status: DC

## 2019-02-22 MED ORDER — LIDOCAINE VISCOUS HCL 2 % MT SOLN
15.0000 mL | Freq: Once | OROMUCOSAL | Status: DC
  Filled 2019-02-22: qty 15

## 2019-02-22 MED ORDER — ACETAMINOPHEN 325 MG PO TABS
650.0000 mg | ORAL_TABLET | Freq: Once | ORAL | Status: AC
  Administered 2019-02-22: 650 mg via ORAL
  Filled 2019-02-22: qty 2

## 2019-02-22 NOTE — ED Notes (Signed)
First nurse note: pt states she is having a sore throat, and feels hot all over. Pt was stopped on the way into the ER and had her temp taken at 98.5. no distress noted, no sob noted. Pt drove herself here and has her dr's office on the phone as she is being registered and states that she is on hold

## 2019-02-22 NOTE — ED Provider Notes (Signed)
[facility] Emergency Department Provider Note  ____________________________________________  Time seen: Approximately 4:50 PM  I have reviewed the triage vital signs and the nursing notes.   HISTORY  Chief Complaint Sore Throat; Hot Flashes; and Chest Pain    HPI Frances Conley is a 58 y.o. female history of HTN, HL, presenting for sore throat, chest pain and shortness of breath.  The patient reports that last week she was exposed to someone with flulike symptoms.  Since yesterday, she has been having a sore throat without congestion or rhinorrhea, ear pain, fevers or chills.  She felt a tightness in the center of her chest that comes and goes and is not associated with any radiation, palpitations, lightheadedness or syncope, diaphoresis, nausea or vomiting.  She does have some intermittent mild shortness of breath without any lower extremity swelling or calf pain.  She denies any recent travel.  Past Medical History:  Diagnosis Date  . Headache(784.0)   . Hypercholesteremia   . Hypertension   . Ulcer     There are no active problems to display for this patient.   Past Surgical History:  Procedure Laterality Date  . TUBAL LIGATION    . UTERINE FIBROID SURGERY      Current Outpatient Rx  . Order #: [id number] Class: Historical Med  . Order #: [id number] Class: Historical Med  . Order #: [id number] Class: Historical Med  . Order #: [id number] Class: Historical Med  . Order #: [id number] Class: Print  . Order #: [id number] Class: Historical Med  . Order #: [id number] Class: Print  . Order #: [id number] Class: Historical Med  . Order #: [id number] Class: Print  . Order #: [id number] Class: Print  . Order #: [id number] Class: Print  . Order #: [id number] Class: Print    Allergies Patient has no known allergies.  No family history on file.  Social History Social History   Tobacco Use  . Smoking status: Former Smoker    Packs/day: 0.50    Types: Cigarettes  .  Smokeless tobacco: Never Used  Substance Use Topics  . Alcohol use: No  . Drug use: Never    Review of Systems Constitutional: No fever/chills.  No lightheadedness or syncope. Eyes: No visual changes.  No eye discharge. ENT: Positive sore throat. No congestion or rhinorrhea. Cardiovascular: Positive central chest pain. Denies palpitations. Respiratory: Positive shortness of breath.  No cough. Gastrointestinal: No abdominal pain.  No nausea, no vomiting.  No diarrhea.  No constipation. Genitourinary: Negative for dysuria. Musculoskeletal: Negative for back pain.  No lower extremity swelling or calf pain. Skin: Negative for rash. Neurological: Negative for headaches. No focal numbness, tingling or weakness.     ____________________________________________   PHYSICAL EXAM:  VITAL SIGNS: ED Triage Vitals  Enc Vitals Group     BP 02/22/19 1552 126/73     Pulse Rate 02/22/19 1552 65     Resp 02/22/19 1552 16     Temp 02/22/19 1552 99.8 F (37.7 C)     Temp Source 02/22/19 1552 Oral     SpO2 02/22/19 1552 99 %     Weight 02/22/19 1553 182 lb (82.6 kg)     Height 02/22/19 1553 5\' 3"  (1.6 m)     Head Circumference --      Peak Flow --      Pain Score 02/22/19 1553 5     Pain Loc --      Pain Edu? --      Excl. in [place]? --     Constitutional: Alert and  oriented. Answers questions appropriately. Eyes: Conjunctivae are normal.  EOMI. No scleral icterus.  No eye discharge. Head: Atraumatic. Nose: No congestion/rhinnorhea. Mouth/Throat: Mucous membranes are moist.  Patient does have posterior pharyngeal erythema with mild tonsillar swelling but no exudate.  She has no vesicles on the palate.  The palate is symmetric and the uvula is midline.  No hoarse voice, drooling stridor or trismus. Neck: No stridor.  Supple.  No JVD.  No meningismus. Cardiovascular: Normal rate, regular rhythm. No murmurs, rubs or gallops.  Respiratory: Normal respiratory effort.  No accessory muscle use or  retractions. Lungs CTAB.  No wheezes, rales or ronchi. Gastrointestinal: Soft, nontender and nondistended.  No guarding or rebound.  No peritoneal signs. Musculoskeletal: No LE edema. No ttp in the calves or palpable cords.  Negative Homan's sign. Neurologic:  A&Ox3.  Speech is clear.  Face and smile are symmetric.  EOMI.  Moves all extremities well. Skin:  Skin is warm, dry and intact. No rash noted. Psychiatric: Mood and affect are normal.   ____________________________________________   LABS (all labs ordered are listed, but only abnormal results are displayed)  Labs Reviewed  BASIC METABOLIC PANEL - Abnormal; Notable for the following components:      Result Value   Potassium 2.9 (*)    Calcium 8.6 (*)    All other components within normal limits  CBC - Abnormal; Notable for the following components:   RBC 5.15 (*)    All other components within normal limits  CBC WITH DIFFERENTIAL/PLATELET - Abnormal; Notable for the following components:   RBC 5.18 (*)    All other components within normal limits  GROUP A STREP BY PCR  TROPONIN I  INFLUENZA PANEL BY PCR (TYPE A & B)  POC URINE PREG, ED  POCT PREGNANCY, URINE   ____________________________________________  EKG  ED ECG REPORT I, Anne-Caroline Mariea Clonts, the attending physician, personally viewed and interpreted this ECG.   Date: 02/22/2019  EKG Time: 1600  Rate: 61  Rhythm: normal sinus rhythm  Axis: normal  Intervals:none  ST&T Change: No STEMI  ____________________________________________  RADIOLOGY  Dg Chest 2 View  Result Date: 02/22/2019 CLINICAL DATA:  Chest pain EXAM: CHEST - 2 VIEW COMPARISON:  03/17/2018 FINDINGS: The heart size and mediastinal contours are within normal limits. Both lungs are clear. The visualized skeletal structures are unremarkable. IMPRESSION: No active cardiopulmonary disease. Electronically Signed   By: Donavan Foil M.D.   On: 02/22/2019 16:49     ____________________________________________   PROCEDURES  Procedure(s) performed: None  Procedures  Critical Care performed: No ____________________________________________   INITIAL IMPRESSION / ASSESSMENT AND PLAN / ED COURSE  Pertinent labs & imaging results that were available during my care of the patient were reviewed by me and considered in my medical decision making (see chart for details).  58 y.o. female presenting with 2 days of sore throat, chest pain and shortness of breath after exposure to influenza last week.  Overall, the patient is hemodynamically stable.  Her cardiopulmonary examination is very reassuring.  Her EKG does not show any ischemic changes or arrhythmia and her troponin is negative.  I do not suspect ACS or MI.  The patient may have an infectious etiology and will check for influenza or strep.  If those are negative, coronavirus cannot be excluded but she does not meet criteria for testing at this time.  My recommendation will be quarantined for 14 days if influenza and strep testing are negative.  ----------------------------------------- 4:55 PM on  02/22/2019 -----------------------------------------  The patient's laboratory studies show hypokalemia with a potassium of 2.9, which is at the patient's baseline.  I will supplement her here and have her take 4 more days of potassium at home and follow-up with her PMD.  The patient's white blood cell count is normal and her lymphocyte count is pending.  Her chest x-ray does not show any acute cardiopulmonary process.  Plan reevaluation for final disposition.  ----------------------------------------- 6:20 PM on 02/22/2019 -----------------------------------------  The patient's work-up in the emergency department has been reassuring.  She is negative for influenza and strep.  She is hypokalemic and we will supplement her as noted above.  Her differential shows normal white blood cell count and she is  not lymphopenic.  Her chest x-ray does not show any infection.  At this time, the patient will be discharged home.  I have asked her to self isolate for 14 days.  ____________________________________________  FINAL CLINICAL IMPRESSION(S) / ED DIAGNOSES  Final diagnoses:  Hypokalemia  Sore throat  Chest pain, unspecified type  Shortness of breath         NEW MEDICATIONS STARTED DURING THIS VISIT:  Current Discharge Medication List        Eula Listen, MD 02/22/19 1821

## 2019-02-22 NOTE — ED Triage Notes (Signed)
Patient states for two days she has had a sore throat and felt very hot.  Patient denies cough.  Patient is in no obvious distress at this time.

## 2019-02-22 NOTE — ED Notes (Signed)
Patient transported to X-ray 

## 2019-02-22 NOTE — Discharge Instructions (Addendum)
Please monitor your symptoms, and make sure that you isolate yourself from anybody else to prevent the spread of infection.  Your potassium was low today.  Please take potassium supplementation and have your potassium level rechecked.  If you continue to have sore throat or any other cough symptoms, please call your clinic ahead of time to let them know you are feeling this way.  Return to the emergency department if you develop severe pain, lightheadedness or fainting, shortness of breath, or any other symptoms concerning to you.

## 2019-08-13 ENCOUNTER — Emergency Department (HOSPITAL_COMMUNITY): Payer: Self-pay

## 2019-08-13 ENCOUNTER — Emergency Department (HOSPITAL_COMMUNITY)
Admission: EM | Admit: 2019-08-13 | Discharge: 2019-08-13 | Discharge status: Against Medical Advice | Payer: Self-pay | Attending: Emergency Medicine | Admitting: Emergency Medicine

## 2019-08-13 ENCOUNTER — Encounter (HOSPITAL_COMMUNITY): Payer: Self-pay

## 2019-08-13 ENCOUNTER — Other Ambulatory Visit: Payer: Self-pay

## 2019-08-13 DIAGNOSIS — I10 Essential (primary) hypertension: Secondary | ICD-10-CM | POA: Insufficient documentation

## 2019-08-13 DIAGNOSIS — Z79899 Other long term (current) drug therapy: Secondary | ICD-10-CM | POA: Insufficient documentation

## 2019-08-13 DIAGNOSIS — Z5320 Procedure and treatment not carried out because of patient's decision for unspecified reasons: Secondary | ICD-10-CM | POA: Insufficient documentation

## 2019-08-13 DIAGNOSIS — Z87891 Personal history of nicotine dependence: Secondary | ICD-10-CM | POA: Insufficient documentation

## 2019-08-13 DIAGNOSIS — R079 Chest pain, unspecified: Secondary | ICD-10-CM | POA: Insufficient documentation

## 2019-08-13 LAB — CBC
HCT: 41.6 % (ref 36.0–46.0)
Hemoglobin: 13.5 g/dL (ref 12.0–15.0)
MCH: 27.6 pg (ref 26.0–34.0)
MCHC: 32.5 g/dL (ref 30.0–36.0)
MCV: 84.9 fL (ref 80.0–100.0)
Platelets: 229 10*3/uL (ref 150–400)
RBC: 4.9 MIL/uL (ref 3.87–5.11)
RDW: 14.3 % (ref 11.5–15.5)
WBC: 7.7 10*3/uL (ref 4.0–10.5)
nRBC: 0 % (ref 0.0–0.2)

## 2019-08-13 LAB — TROPONIN I (HIGH SENSITIVITY): Troponin I (High Sensitivity): 5 ng/L (ref <18)

## 2019-08-13 LAB — BASIC METABOLIC PANEL
Anion gap: 10 (ref 5–15)
BUN: 11 mg/dL (ref 6–20)
CO2: 27 mmol/L (ref 22–32)
Calcium: 8.9 mg/dL (ref 8.9–10.3)
Chloride: 104 mmol/L (ref 98–111)
Creatinine, Ser: 0.92 mg/dL (ref 0.44–1.00)
GFR calc Af Amer: >60 mL/min (ref >60)
GFR calc non Af Amer: >60 mL/min (ref >60)
Glucose, Bld: 79 mg/dL (ref 70–99)
Potassium: 2.8 mmol/L — ABNORMAL LOW (ref 3.5–5.1)
Sodium: 141 mmol/L (ref 135–145)

## 2019-08-13 MED ORDER — KETOROLAC TROMETHAMINE 30 MG/ML IJ SOLN
30.0000 mg | Freq: Once | INTRAMUSCULAR | Status: AC
  Administered 2019-08-13: 30 mg via INTRAVENOUS
  Filled 2019-08-13: qty 1

## 2019-08-13 MED ORDER — IOHEXOL 350 MG/ML SOLN
100.0000 mL | Freq: Once | INTRAVENOUS | Status: AC | PRN
  Administered 2019-08-13: 100 mL via INTRAVENOUS

## 2019-08-13 MED ORDER — SODIUM CHLORIDE 0.9% FLUSH: 3.0000 mL | Freq: Once | INTRAVENOUS | Status: DC

## 2019-08-13 MED ORDER — SODIUM CHLORIDE (PF) 0.9 % IJ SOLN
INTRAMUSCULAR | Status: AC
  Filled 2019-08-13: qty 50

## 2019-08-13 MED ORDER — LORAZEPAM 2 MG/ML IJ SOLN
1.0000 mg | Freq: Once | INTRAMUSCULAR | Status: DC
  Filled 2019-08-13: qty 1

## 2019-08-13 MED ORDER — SODIUM CHLORIDE 0.9 % IV BOLUS
1000.0000 mL | Freq: Once | INTRAVENOUS | Status: AC
  Administered 2019-08-13: 1000 mL via INTRAVENOUS

## 2019-08-13 NOTE — ED Triage Notes (Signed)
Patient c/o left chest pain and SOB since last night. Patient states the pain is constant ,but has intensified. Patient states the pain radiates into her back. Patient reports that she is having clear thick sputum.

## 2019-08-13 NOTE — ED Notes (Signed)
Patient transported to CT 

## 2019-08-13 NOTE — ED Notes (Signed)
Patient reports "I just want to leave and take the test another day." MD made aware. Will have patient sign AMA form.

## 2019-08-13 NOTE — ED Notes (Signed)
Patient unable to complete CT scan. Reports increased anxiety during scan. MD made aware. Will administer ativan per Dr. Stark Jock.

## 2019-08-13 NOTE — ED Provider Notes (Signed)
[facility] DEPT Provider Note   CSN: [id number] Arrival date & time: 08/13/19  1416     History   Chief Complaint Chief Complaint  Patient presents with  . Chest Pain  . Shortness of Breath    HPI Frances Conley is a 58 y.o. female.     Patient is a 58 year old female with past medical history of hypertension, hypercholesterolemia.  She presents today for evaluation of chest pain.  She describes a sharp pain to the left side of her chest behind her left breast that radiates through into her back.  This began yesterday and is worsening.  It is worse when she breathes, coughs, and changes position.  She denies leg swelling or calf pain.  She denies any fevers, chills, or ill contacts.  Patient states she has had similar pains in the past, but testing has been unremarkable and no definite cause has been found.  The history is provided by the patient.  Chest Pain Pain location:  L chest Pain quality: sharp   Pain radiates to:  L shoulder and mid back Pain severity:  Moderate Onset quality:  Sudden Duration:  24 hours Timing:  Constant Progression:  Worsening Chronicity:  Recurrent Context: breathing and movement   Relieved by:  Nothing Worsened by:  Coughing, movement, certain positions and deep breathing Ineffective treatments:  None tried   Past Medical History:  Diagnosis Date  . Headache(784.0)   . Hypercholesteremia   . Hypertension   . Ulcer     There are no active problems to display for this patient.   Past Surgical History:  Procedure Laterality Date  . TUBAL LIGATION    . UTERINE FIBROID SURGERY       OB History   No obstetric history on file.      Home Medications    Prior to Admission medications   Medication Sig Start Date End Date Taking? Authorizing Provider  doxycycline (VIBRA-TABS) 100 MG tablet Take 1 tablet (100 mg total) by mouth 2 (two) times daily. 06/12/16   Mesner, Corene Cornea, MD  fluticasone (FLONASE) 50  MCG/ACT nasal spray Place 2 sprays into the nose daily. 02/22/19 03/24/19  [provider]  hydrochlorothiazide (HYDRODIURIL) 25 MG tablet Take 25 mg by mouth daily. 04/07/17   [provider]  IRON PO Take 1 tablet by mouth daily.    [provider]  lisinopril-hydrochlorothiazide (ZESTORETIC) 20-25 MG tablet Take 1 tablet by mouth daily. 11/07/15   Orpah Greek, MD  loratadine (CLARITIN) 10 MG tablet Take 10 mg by mouth daily. 02/22/19   [provider]  Multiple Vitamin (MULTIVITAMIN WITH MINERALS) TABS Take 1 tablet by mouth daily.    [provider]  omeprazole (PRILOSEC) 20 MG capsule Take 1 capsule (20 mg total) by mouth daily. 11/07/15   Orpah Greek, MD  permethrin (ELIMITE) 5 % cream Apply to affected area once at night from neck down, wash off in the morning x one application. May repeat in 1 week with persistent itching or new rash. 05/03/13   Charlann Lange, PA-C  potassium chloride SA (K-DUR,KLOR-CON) 20 MEQ tablet Take 2 tablets (40 mEq total) by mouth 2 (two) times daily for 4 days. 02/22/19 02/26/19  Eula Listen, MD  simvastatin (ZOCOR) 20 MG tablet Take 1 tablet (20 mg total) by mouth daily. 11/07/15   Orpah Greek, MD  Vitamin D, Ergocalciferol, (DRISDOL) 1.25 MG (50000 UT) CAPS capsule Take 50,000 Units by mouth once a week.  07/28/17   [provider]    Family History Family History  Problem Relation Age of Onset  . Heart failure Mother   . Cancer Mother   . Diabetes Mother   . Heart failure Father   . Chronic Renal Failure Father   . Diabetes Father   . Heart failure Sister     Social History Social History   Tobacco Use  . Smoking status: Former Smoker    Packs/day: 0.50    Types: Cigarettes  . Smokeless tobacco: Never Used  Substance Use Topics  . Alcohol use: No  . Drug use: Never     Allergies   Patient has no known allergies.   Review of Systems Review of Systems   All other systems reviewed and are negative.    Physical Exam Updated Vital Signs BP (!) 132/95 (BP Location: Left Arm)   Pulse 68   Temp 98.3 F (36.8 C) (Oral)   Resp 14   Ht 5\' 3"  (1.6 m)   Wt 83.9 kg   SpO2 98%   BMI 32.77 kg/m   Physical Exam Vitals signs and nursing note reviewed.  Constitutional:      General: She is not in acute distress.    Appearance: She is well-developed. She is not diaphoretic.  HENT:     Head: Normocephalic and atraumatic.  Neck:     Musculoskeletal: Normal range of motion and neck supple.  Cardiovascular:     Rate and Rhythm: Normal rate and regular rhythm.     Heart sounds: No murmur. No friction rub. No gallop.   Pulmonary:     Effort: Pulmonary effort is normal. No respiratory distress.     Breath sounds: Normal breath sounds. No wheezing.  Abdominal:     General: Bowel sounds are normal. There is no distension.     Palpations: Abdomen is soft.     Tenderness: There is no abdominal tenderness.  Musculoskeletal: Normal range of motion.     Right lower leg: She exhibits no tenderness. No edema.     Left lower leg: She exhibits no tenderness. No edema.     Comments: Homans sign is absent bilaterally.  Skin:    General: Skin is warm and dry.  Neurological:     Mental Status: She is alert and oriented to person, place, and time.      ED Treatments / Results  Labs (all labs ordered are listed, but only abnormal results are displayed) Labs Reviewed  BASIC METABOLIC PANEL  CBC  I-STAT BETA HCG BLOOD, ED ([facility], [facility], [facility] ONLY)  TROPONIN I (HIGH SENSITIVITY)    EKG EKG Interpretation  Date/Time:  Tuesday August 13 2019 14:41:04 EDT Ventricular Rate:  68 PR Interval:    QRS Duration: 98 QT Interval:  396 QTC Calculation: 422 R Axis:   9 Text Interpretation:  Sinus rhythm Low voltage, precordial leads RSR' in V1 or V2, right VCD or RVH ST elevation, consider inferior injury Confirmed by Frances Conley [phone]) on 08/13/2019 3:42:38  PM   Radiology Dg Chest 2 View  Result Date: 08/13/2019 CLINICAL DATA:  Shortness of breath and left-sided chest pain EXAM: CHEST - 2 VIEW COMPARISON:  02/22/2019 FINDINGS: The heart size and mediastinal contours are within normal limits. Both lungs are clear. The visualized skeletal structures are unremarkable. IMPRESSION: No active cardiopulmonary disease. Electronically Signed   By: Inez Catalina M.D.   On: 08/13/2019 15:39    Procedures Procedures (including critical care time)  Medications  Ordered in ED Medications  sodium chloride flush (NS) 0.9 % injection 3 mL (has no administration in time range)  ketorolac (TORADOL) 30 MG/ML injection 30 mg (has no administration in time range)  sodium chloride 0.9 % bolus 1,000 mL (has no administration in time range)     Initial Impression / Assessment and Plan / ED Course  I have reviewed the triage vital signs and the nursing notes.  Pertinent labs & imaging results that were available during my care of the patient were reviewed by me and considered in my medical decision making (see chart for details).  Patient presents here with complaints of pain in her chest.  It starts behind her left breast, and radiates through to her back.  It is worse with movement, deep breathing, and change in position.  EKG shows no acute changes and laboratory studies are reassuring including troponin which returned as normal at 5.  Chest x-ray unremarkable.  My plan was to rule out pulmonary embolism with a CT angiogram of the chest.  Attempt was made at performing this study, however the patient became anxious and could not complete it.  She was given intravenous Ativan to assist with her anxiety.  While waiting to return to radiology for another attempt at this study, the patient decided she no longer wanted to be here, then signed out [place].  This occurred prior to my speaking with her and re-evaluation.    Final Clinical Impressions(s) /  ED Diagnoses   Final diagnoses:  None    ED Discharge Orders    None       Frances Speak, MD 08/13/19 2243

## 2019-08-13 NOTE — ED Notes (Signed)
Patient alert and oriented, in NAD. Ambulatory out of department without difficulty.

## 2019-09-23 ENCOUNTER — Other Ambulatory Visit: Payer: Self-pay

## 2019-09-23 DIAGNOSIS — Z20822 Contact with and (suspected) exposure to covid-19: Secondary | ICD-10-CM

## 2019-09-25 LAB — NOVEL CORONAVIRUS, NAA: SARS-CoV-2, NAA: NOT DETECTED

## 2020-04-01 DIAGNOSIS — S9032XA Contusion of left foot, initial encounter: Secondary | ICD-10-CM | POA: Insufficient documentation

## 2020-06-19 ENCOUNTER — Ambulatory Visit: Payer: Self-pay | Admitting: Family Medicine

## 2020-06-19 ENCOUNTER — Other Ambulatory Visit: Payer: Self-pay

## 2020-06-19 ENCOUNTER — Encounter: Payer: Self-pay | Admitting: Family Medicine

## 2020-06-19 ENCOUNTER — Ambulatory Visit (INDEPENDENT_AMBULATORY_CARE_PROVIDER_SITE_OTHER): Payer: 59 | Admitting: Family Medicine

## 2020-06-19 VITALS — BP 136/82 | HR 67 | Temp 98.0°F | Ht 63.0 in | Wt 178.4 lb

## 2020-06-19 DIAGNOSIS — E782 Mixed hyperlipidemia: Secondary | ICD-10-CM | POA: Diagnosis not present

## 2020-06-19 DIAGNOSIS — Z87898 Personal history of other specified conditions: Secondary | ICD-10-CM

## 2020-06-19 DIAGNOSIS — I10 Essential (primary) hypertension: Secondary | ICD-10-CM

## 2020-06-19 MED ORDER — OMEPRAZOLE 20 MG PO CPDR: 20.0000 mg | DELAYED_RELEASE_CAPSULE | Freq: Every day | ORAL | 3 refills | Status: DC

## 2020-06-19 MED ORDER — SIMVASTATIN 40 MG PO TABS: 40.0000 mg | ORAL_TABLET | Freq: Every day | ORAL | 3 refills | Status: DC

## 2020-06-19 MED ORDER — HYDROCHLOROTHIAZIDE 25 MG PO TABS: 25.0000 mg | ORAL_TABLET | Freq: Every day | ORAL | 3 refills | Status: DC

## 2020-06-19 NOTE — Patient Instructions (Addendum)
  Continue to take your hydrochlorothiazide daily.  I am renewing your omeprazole which you should take daily also because of your history of ulcer and symptoms of there.  Take the simvastatin daily.  It is best taken in the evening after supper.  It is for your cholesterol.  Make and keep an appointment with Dr. Nyoka Cowden for sometime in the next couple of months to get acquainted and established with labs.  Continue to walk and get regular exercise.  Get rid of the Pepsi's.  Try and reduce quantities with eating.  Drink water.  Return as necessary.  If you have lab work done today you will be contacted with your lab results within the next 2 weeks.  If you have not heard from [place] then please contact us. The fastest way to get your results is to register for My Chart.   IF you received an x-ray today, you will receive an invoice from [facility]. Please contact [facility] at [phone] with questions or concerns regarding your invoice.   IF you received labwork today, you will receive an invoice from [facility]. Please contact [facility] at [phone] with questions or concerns regarding your invoice.   Our billing staff will not be able to assist you with questions regarding bills from these companies.  You will be contacted with the lab results as soon as they are available. The fastest way to get your results is to activate your My Chart account. Instructions are located on the last page of this paperwork. If you have not heard from [place] regarding the results in 2 weeks, please contact this office.

## 2020-06-19 NOTE — Progress Notes (Signed)
Patient ID: Frances Conley, female    DOB: 11-20-1961  Age: 59 y.o. MRN: [id number]  Chief Complaint  Patient presents with  . Medication Refill    Subjective:   Patient is here to get medicines refilled acutely.  She is about out of her hydrochlorothiazide and is out of her other medicines.  She is apparently planning to see Dr. Carlota Raspberry sometime in the near future to get established.  She feels okay.  She has changed jobs recently and is not walking as much in her job.  She regrets her weight and her weight gain.  She wonders whether some of it came from a steroid injection she got in her shoulder recently by the orthopedic doctor.  No major complaints.  Has a history of peptic ulcer disease.  Has a history of hypercholesterolemia.  Current allergies, medications, problem list, past/family and social histories reviewed.  Objective:  BP 136/82 (BP Location: Right Arm, Patient Position: Sitting, Cuff Size: Normal)   Pulse 67   Temp 98 F (36.7 C) (Temporal)   Ht '5\' 3"'  (1.6 m)   Wt 178 lb 6.4 oz (80.9 kg)   SpO2 98%   BMI 31.60 kg/m  Pleasant lady no acute distress.  Chest clear.  Heart regular without murmur.  No edema.  Assessment & Plan:   Assessment: 1. Essential hypertension   2. Mixed hyperlipidemia   3. History of ulcer disease       Plan: See instructions  Orders Placed This Encounter  Procedures  . CBC  . CMP14+EGFR  . Lipid panel  . TSH    Meds ordered this encounter  Medications  . hydrochlorothiazide (HYDRODIURIL) 25 MG tablet    Sig: Take 1 tablet (25 mg total) by mouth daily.    Dispense:  30 tablet    Refill:  3  . omeprazole (PRILOSEC) 20 MG capsule    Sig: Take 1 capsule (20 mg total) by mouth daily.    Dispense:  30 capsule    Refill:  3  . simvastatin (ZOCOR) 40 MG tablet    Sig: Take 1 tablet (40 mg total) by mouth daily.    Dispense:  30 tablet    Refill:  3         Patient Instructions    Continue to take your hydrochlorothiazide  daily.  I am renewing your omeprazole which you should take daily also because of your history of ulcer and symptoms of there.  Take the simvastatin daily.  It is best taken in the evening after supper.  It is for your cholesterol.  Make and keep an appointment with Dr. Nyoka Cowden for sometime in the next couple of months to get acquainted and established with labs.  Continue to walk and get regular exercise.  Get rid of the Pepsi's.  Try and reduce quantities with eating.  Drink water.  Return as necessary.  If you have lab work done today you will be contacted with your lab results within the next 2 weeks.  If you have not heard from [place] then please contact us. The fastest way to get your results is to register for My Chart.   IF you received an x-ray today, you will receive an invoice from [facility]. Please contact [facility] at [phone] with questions or concerns regarding your invoice.   IF you received labwork today, you will receive an invoice from [facility]. Please contact [facility] at [phone] with questions or concerns regarding your invoice.   Our  billing staff will not be able to assist you with questions regarding bills from these companies.  You will be contacted with the lab results as soon as they are available. The fastest way to get your results is to activate your My Chart account. Instructions are located on the last page of this paperwork. If you have not heard from [place] regarding the results in 2 weeks, please contact this office.        Return in about 2 months (around 08/20/2020), or Dr. Carlota Raspberry to establish as planned, for Hypertension, hyperlipidemia, and history of ulcer.Ruben Reason, MD 06/19/2020

## 2020-06-20 LAB — CMP14+EGFR
ALT: 17 IU/L (ref 0–32)
AST: 17 IU/L (ref 0–40)
Albumin/Globulin Ratio: 1.4 (ref 1.2–2.2)
Albumin: 4.1 g/dL (ref 3.8–4.9)
Alkaline Phosphatase: 101 IU/L (ref 48–121)
BUN/Creatinine Ratio: 14 (ref 9–23)
BUN: 11 mg/dL (ref 6–24)
Bilirubin Total: 0.4 mg/dL (ref 0.0–1.2)
CO2: 25 mmol/L (ref 20–29)
Calcium: 8.8 mg/dL (ref 8.7–10.2)
Chloride: 107 mmol/L — ABNORMAL HIGH (ref 96–106)
Creatinine, Ser: 0.81 mg/dL (ref 0.57–1.00)
GFR calc Af Amer: 93 mL/min/{1.73_m2} (ref >59)
GFR calc non Af Amer: 80 mL/min/{1.73_m2} (ref >59)
Globulin, Total: 3 g/dL (ref 1.5–4.5)
Glucose: 74 mg/dL (ref 65–99)
Potassium: 3.9 mmol/L (ref 3.5–5.2)
Sodium: 146 mmol/L — ABNORMAL HIGH (ref 134–144)
Total Protein: 7.1 g/dL (ref 6.0–8.5)

## 2020-06-20 LAB — CBC
Hematocrit: 40.3 % (ref 34.0–46.6)
Hemoglobin: 13.4 g/dL (ref 11.1–15.9)
MCH: 27.4 pg (ref 26.6–33.0)
MCHC: 33.3 g/dL (ref 31.5–35.7)
MCV: 82 fL (ref 79–97)
Platelets: 224 10*3/uL (ref 150–450)
RBC: 4.89 x10E6/uL (ref 3.77–5.28)
RDW: 13.9 % (ref 11.7–15.4)
WBC: 6.2 10*3/uL (ref 3.4–10.8)

## 2020-06-20 LAB — LIPID PANEL
Chol/HDL Ratio: 6.6 ratio — ABNORMAL HIGH (ref 0.0–4.4)
Cholesterol, Total: 257 mg/dL — ABNORMAL HIGH (ref 100–199)
HDL: 39 mg/dL — ABNORMAL LOW (ref >39)
LDL Chol Calc (NIH): 195 mg/dL — ABNORMAL HIGH (ref 0–99)
Triglycerides: 128 mg/dL (ref 0–149)
VLDL Cholesterol Cal: 23 mg/dL (ref 5–40)

## 2020-06-20 LAB — TSH: TSH: 0.397 u[IU]/mL — ABNORMAL LOW (ref 0.450–4.500)

## 2020-06-22 ENCOUNTER — Encounter: Payer: Self-pay | Admitting: Family Medicine

## 2020-07-16 NOTE — Progress Notes (Signed)
Call:  When patient was here a few weeks ago her lab tests showed that the thyroid was a little bit overactive.  This is similar to what it was several years ago, but I think it is important to following up with a more complete thyroid panel of tests.  I had suggested that she see Dr. Carlota Raspberry back this fall, and I would like you to make sure that she has an appointment to see him sometime in the next 2 to 3 months.  Her cholesterol was still high, and it is important that she takes her medicine faithfully.Return sooner if problemsDavid H. Linna Darner, MD (sorry I was [place] and delayed in getting this report to you.)

## 2020-08-14 ENCOUNTER — Ambulatory Visit (INDEPENDENT_AMBULATORY_CARE_PROVIDER_SITE_OTHER): Payer: 59 | Admitting: Family Medicine

## 2020-08-14 ENCOUNTER — Other Ambulatory Visit (HOSPITAL_COMMUNITY)
Admission: RE | Admit: 2020-08-14 | Discharge: 2020-08-14 | Disposition: A | Discharge status: Home / Self Care | Payer: Self-pay | Source: Ambulatory Visit | Attending: Family Medicine | Admitting: Family Medicine

## 2020-08-14 ENCOUNTER — Encounter: Payer: Self-pay | Admitting: Family Medicine

## 2020-08-14 ENCOUNTER — Other Ambulatory Visit: Payer: Self-pay

## 2020-08-14 VITALS — BP 130/87 | HR 63 | Temp 98.1°F | Ht 63.0 in | Wt 179.0 lb

## 2020-08-14 DIAGNOSIS — N898 Other specified noninflammatory disorders of vagina: Secondary | ICD-10-CM | POA: Diagnosis not present

## 2020-08-14 DIAGNOSIS — Z87898 Personal history of other specified conditions: Secondary | ICD-10-CM

## 2020-08-14 DIAGNOSIS — B9689 Other specified bacterial agents as the cause of diseases classified elsewhere: Secondary | ICD-10-CM

## 2020-08-14 DIAGNOSIS — Z124 Encounter for screening for malignant neoplasm of cervix: Secondary | ICD-10-CM | POA: Insufficient documentation

## 2020-08-14 DIAGNOSIS — E785 Hyperlipidemia, unspecified: Secondary | ICD-10-CM

## 2020-08-14 DIAGNOSIS — D219 Benign neoplasm of connective and other soft tissue, unspecified: Secondary | ICD-10-CM

## 2020-08-14 DIAGNOSIS — N760 Acute vaginitis: Secondary | ICD-10-CM

## 2020-08-14 DIAGNOSIS — R7989 Other specified abnormal findings of blood chemistry: Secondary | ICD-10-CM | POA: Diagnosis not present

## 2020-08-14 DIAGNOSIS — E870 Hyperosmolality and hypernatremia: Secondary | ICD-10-CM

## 2020-08-14 DIAGNOSIS — Z114 Encounter for screening for human immunodeficiency virus [HIV]: Secondary | ICD-10-CM

## 2020-08-14 DIAGNOSIS — R109 Unspecified abdominal pain: Secondary | ICD-10-CM | POA: Diagnosis not present

## 2020-08-14 LAB — POC MICROSCOPIC URINALYSIS (UMFC): Mucus: ABSENT

## 2020-08-14 LAB — POCT URINALYSIS DIP (MANUAL ENTRY)
Bilirubin, UA: NEGATIVE
Glucose, UA: NEGATIVE mg/dL
Ketones, POC UA: NEGATIVE mg/dL
Leukocytes, UA: NEGATIVE
Nitrite, UA: NEGATIVE
Protein Ur, POC: NEGATIVE mg/dL
Spec Grav, UA: 1.025 (ref 1.010–1.025)
Urobilinogen, UA: 0.2 E.U./dL
pH, UA: 5.5 (ref 5.0–8.0)

## 2020-08-14 LAB — POCT WET + KOH PREP
Trich by wet prep: ABSENT
Yeast by KOH: ABSENT
Yeast by wet prep: ABSENT

## 2020-08-14 MED ORDER — OMEPRAZOLE 20 MG PO CPDR: 20.0000 mg | DELAYED_RELEASE_CAPSULE | Freq: Every day | ORAL | 3 refills | Status: DC

## 2020-08-14 MED ORDER — METRONIDAZOLE 500 MG PO TABS: ORAL_TABLET | ORAL | 0 refills | Status: DC

## 2020-08-14 NOTE — Patient Instructions (Addendum)
Start simvastatin every night and we can recheck levels in 6 weeks.   Continue to avoid nsaids, restart omeprazole daily.   I will check other labs today and will discuss labs at follow up visit.   Start metronidazole for possible bacterial vaginosis.  I will also refer you to OB/GYN to discuss previous fibroids.  If abdominal cramping is not improving this weekend, or any worsening, return here or urgent care.  Bacterial Vaginosis  Bacterial vaginosis is a vaginal infection that occurs when the normal balance of bacteria in the vagina is disrupted. It results from an overgrowth of certain bacteria. This is the most common vaginal infection among women ages 77-44. Because bacterial vaginosis increases your risk for STIs (sexually transmitted infections), getting treated can help reduce your risk for chlamydia, gonorrhea, herpes, and HIV (human immunodeficiency virus). Treatment is also important for preventing complications in pregnant women, because this condition can cause an early (premature) delivery. What are the causes? This condition is caused by an increase in harmful bacteria that are normally present in small amounts in the vagina. However, the reason that the condition develops is not fully understood. What increases the risk? The following factors may make you more likely to develop this condition:  Having a new sexual partner or multiple sexual partners.  Having unprotected sex.  Douching.  Having an intrauterine device (IUD).  Smoking.  Drug and alcohol abuse.  Taking certain antibiotic medicines.  Being pregnant. You cannot get bacterial vaginosis from toilet seats, bedding, swimming pools, or contact with objects around you. What are the signs or symptoms? Symptoms of this condition include:  Grey or white vaginal discharge. The discharge can also be watery or foamy.  A fish-like odor with discharge, especially after sexual intercourse or during  menstruation.  Itching in and around the vagina.  Burning or pain with urination. Some women with bacterial vaginosis have no signs or symptoms. How is this diagnosed? This condition is diagnosed based on:  Your medical history.  A physical exam of the vagina.  Testing a sample of vaginal fluid under a microscope to look for a large amount of bad bacteria or abnormal cells. Your health care provider may use a cotton swab or a small wooden spatula to collect the sample. How is this treated? This condition is treated with antibiotics. These may be given as a pill, a vaginal cream, or a medicine that is put into the vagina (suppository). If the condition comes back after treatment, a second round of antibiotics may be needed. Follow these instructions at home: Medicines  Take over-the-counter and prescription medicines only as told by your health care provider.  Take or use your antibiotic as told by your health care provider. Do not stop taking or using the antibiotic even if you start to feel better. General instructions  If you have a female sexual partner, tell her that you have a vaginal infection. She should see her health care provider and be treated if she has symptoms. If you have a female sexual partner, he does not need treatment.  During treatment: ? Avoid sexual activity until you finish treatment. ? Do not douche. ? Avoid alcohol as directed by your health care provider. ? Avoid breastfeeding as directed by your health care provider.  Drink enough water and fluids to keep your urine clear or pale yellow.  Keep the area around your vagina and rectum clean. ? Wash the area daily with warm water. ? Wipe yourself from front to  back after using the toilet.  Keep all follow-up visits as told by your health care provider. This is important. How is this prevented?  Do not douche.  Wash the outside of your vagina with warm water only.  Use protection when having sex. This  includes latex condoms and dental dams.  Limit how many sexual partners you have. To help prevent bacterial vaginosis, it is best to have sex with just one partner (monogamous).  Make sure you and your sexual partner are tested for STIs.  Wear cotton or cotton-lined underwear.  Avoid wearing tight pants and pantyhose, especially during summer.  Limit the amount of alcohol that you drink.  Do not use any products that contain nicotine or tobacco, such as cigarettes and e-cigarettes. If you need help quitting, ask your health care provider.  Do not use illegal drugs. Where to find more information  Centers for Disease Control and Prevention: AppraiserFraud.fi  American Sexual Health Association (ASHA): www.ashastd.org  U.S. Department of Health and Financial controller, Office on Women's Health: DustingSprays.pl or SecuritiesCard.it Contact a health care provider if:  Your symptoms do not improve, even after treatment.  You have more discharge or pain when urinating.  You have a fever.  You have pain in your abdomen.  You have pain during sex.  You have vaginal bleeding between periods. Summary  Bacterial vaginosis is a vaginal infection that occurs when the normal balance of bacteria in the vagina is disrupted.  Because bacterial vaginosis increases your risk for STIs (sexually transmitted infections), getting treated can help reduce your risk for chlamydia, gonorrhea, herpes, and HIV (human immunodeficiency virus). Treatment is also important for preventing complications in pregnant women, because the condition can cause an early (premature) delivery.  This condition is treated with antibiotic medicines. These may be given as a pill, a vaginal cream, or a medicine that is put into the vagina (suppository). This information is not intended to replace advice given to you by your health care provider. Make sure you discuss any questions  you have with your health care provider. Document Revised: 11/03/2017 Document Reviewed: 08/06/2016 Elsevier Patient Education  2020 [employer].   Return to the clinic or go to the nearest emergency room if any of your symptoms worsen or new symptoms occur.      If you have lab work done today you will be contacted with your lab results within the next 2 weeks.  If you have not heard from [place] then please contact us. The fastest way to get your results is to register for My Chart.   IF you received an x-ray today, you will receive an invoice from [facility]. Please contact [facility] at [phone] with questions or concerns regarding your invoice.   IF you received labwork today, you will receive an invoice from [place]. Please contact [facility] at [phone] with questions or concerns regarding your invoice.   Our billing staff will not be able to assist you with questions regarding bills from these companies.  You will be contacted with the lab results as soon as they are available. The fastest way to get your results is to activate your My Chart account. Instructions are located on the last page of this paperwork. If you have not heard from [place] regarding the results in 2 weeks, please contact this office.

## 2020-08-14 NOTE — Progress Notes (Signed)
Subjective:  Patient ID: Frances Conley, female    DOB: September 11, 1961  Age: 59 y.o. MRN: [id number]  CC:  Chief Complaint  Patient presents with  . New Patient (Initial Visit)  . Vaginal Discharge    dark in color x 2 days ago, low pelvic pain last period 2013  . Medication Refill    stomach ulcer - omeprazole     HPI Frances Conley presents for   New patient to establish care, concern above. Prior PCP in [facility]. Our office is closer.   Vaginal discharge: Dark vaginal d/c noted 2 days ago. Slight smell. LMP in 2013 with menopause suspected at that time. Bleeding 6 months later, then none since.  Lower abdominal cramping past few days.  Slight discomfort with urination. No dysuria, no fever. No hematuria. No urgency/frequency.  Last pap testing: years ago. Planned to follow up with Dr. Charlesetta Garibaldi, did not see recently- delayed with pandemic. Treated for fibroid in past.   Hypertension: Discussed in July with Dr. Linna Darner.  Continued on hydrochlorothiazide 25 mg daily No new side effects with meds. Occasional headache. No recent changes/worsening.   Home readings: none.  BP Readings from Last 3 Encounters:  08/14/20 130/87  06/19/20 136/82  08/13/19 (!) 146/92   Lab Results  Component Value Date   CREATININE 0.81 06/19/2020   Hyperlipidemia: zocor 40mg  qd - daily use discussed in July -has not been using yet. Not fasting at July visit. Fasting today.   Na 146 on July 16th labs.   Lab Results  Component Value Date   CHOL 257 (H) 06/19/2020   HDL 39 (L) 06/19/2020   LDLCALC 195 (H) 06/19/2020   TRIG 128 06/19/2020   CHOLHDL 6.6 (H) 06/19/2020   Lab Results  Component Value Date   ALT 17 06/19/2020   AST 17 06/19/2020   ALKPHOS 101 06/19/2020   BILITOT 0.4 06/19/2020    History of peptic ulcer disease No recent nsaids. Ulcer many years ago.  Omeprazole 20mg  QD ordered, has not been taking.   Prior low vitamin D - unknown timing. Prior on 50,000iu per week - off  meds past 2 months.   HIV test every 6 months - not sexually active.   Low tsh: Lab Results  Component Value Date   TSH 0.397 (L) 06/19/2020  level of 0.215 in 06/2016.  Referred to endocrinology few years ago - did not go.   History There are no problems to display for this patient.  Past Medical History:  Diagnosis Date  . Arthritis    both hips, hands, shoulders goes to Dr Gladstone Lighter, Jori Moll ortho md  . HQRFXJOI(325.4)   . Hypercholesteremia   . Hypertension   . Ulcer    Past Surgical History:  Procedure Laterality Date  . [place]  . TUBAL LIGATION    . UTERINE FIBROID SURGERY     No Known Allergies Prior to Admission medications   Medication Sig Start Date End Date Taking? Authorizing Provider  hydrochlorothiazide (HYDRODIURIL) 25 MG tablet Take 1 tablet (25 mg total) by mouth daily. 06/19/20  Yes Posey Boyer, MD  omeprazole (PRILOSEC) 20 MG capsule Take 1 capsule (20 mg total) by mouth daily. 06/19/20  Yes Posey Boyer, MD  predniSONE (DELTASONE) 5 MG tablet Take 5 mg by mouth daily with breakfast.   Yes [provider]  simvastatin (ZOCOR) 40 MG tablet Take 1 tablet (40 mg total) by mouth daily.  06/19/20  Yes Posey Boyer, MD  HYDROcodone-acetaminophen (NORCO/VICODIN) 5-325 MG tablet Take one tablet at night for pain; may take up to every 6 hours as needed for pain if not working or driving Patient not taking: Reported on 08/14/2020 12/16/19   [provider]  loratadine (CLARITIN) 10 MG tablet Take 10 mg by mouth daily. Patient not taking: Reported on 08/14/2020 02/22/19   [provider]  meloxicam (MOBIC) 7.5 MG tablet meloxicam 7.5 mg tablet Patient not taking: Reported on 08/14/2020    [provider]  potassium chloride SA (K-DUR,KLOR-CON) 20 MEQ tablet Take 2 tablets (40 mEq total) by mouth 2 (two) times daily for 4 days. Patient not taking: Reported on 08/14/2020 02/22/19 06/19/20  Eula Listen, MD   Social History   Socioeconomic History  . Marital status: Legally Separated    Spouse name: Not on file  . Number of children: Not on file  . Years of education: Not on file  . Highest education level: Not on file  Occupational History  . Not on file  Tobacco Use  . Smoking status: Former Smoker    Packs/day: 0.50    Types: Cigarettes  . Smokeless tobacco: Never Used  Vaping Use  . Vaping Use: Never used  Substance and Sexual Activity  . Alcohol use: No  . Drug use: Never  . Sexual activity: Not on file  Other Topics Concern  . Not on file  Social History Narrative  . Not on file   Social Determinants of Health   Financial Resource Strain:   . Difficulty of Paying Living Expenses: Not on file  Food Insecurity:   . Worried About Charity fundraiser in the Last Year: Not on file  . Ran Out of Food in the Last Year: Not on file  Transportation Needs:   . Lack of Transportation (Medical): Not on file  . Lack of Transportation (Non-Medical): Not on file  Physical Activity:   . Days of Exercise per Week: Not on file  . Minutes of Exercise per Session: Not on file  Stress:   . Feeling of Stress : Not on file  Social Connections:   . Frequency of Communication with Friends and Family: Not on file  . Frequency of Social Gatherings with Friends and Family: Not on file  . Attends Religious Services: Not on file  . Active Member of Clubs or Organizations: Not on file  . Attends Archivist Meetings: Not on file  . Marital Status: Not on file  Intimate Partner Violence:   . Fear of Current or Ex-Partner: Not on file  . Emotionally Abused: Not on file  . Physically Abused: Not on file  . Sexually Abused: Not on file    Review of Systems  Constitutional: Negative for fatigue and unexpected weight change.  Respiratory: Negative for chest tightness and shortness of breath.   Cardiovascular: Negative for chest pain, palpitations and leg swelling.   Gastrointestinal: Negative for abdominal pain and blood in stool.  Genitourinary: Negative for difficulty urinating and menstrual problem.  Neurological: Positive for headaches (occasional, not new - plans ot follow up to discuss. ). Negative for dizziness, syncope and light-headedness.     Objective:   Vitals:   08/14/20 1316  BP: 130/87  Pulse: 63  Temp: 98.1 F (36.7 C)  SpO2: 98%  Weight: 179 lb (81.2 kg)  Height: 5\' 3"  (1.6 m)     Physical Exam Vitals reviewed. Exam conducted with a  chaperone present Noah Delaine present. ).  Constitutional:      Appearance: She is well-developed.  HENT:     Head: Normocephalic and atraumatic.  Eyes:     Conjunctiva/sclera: Conjunctivae normal.     Pupils: Pupils are equal, round, and reactive to light.     Comments: Appears to have some slight proptosis.   Neck:     Vascular: No carotid bruit.  Cardiovascular:     Rate and Rhythm: Normal rate and regular rhythm.     Heart sounds: Normal heart sounds.  Pulmonary:     Effort: Pulmonary effort is normal.     Breath sounds: Normal breath sounds.  Abdominal:     Palpations: Abdomen is soft. There is no pulsatile mass.     Tenderness: There is no abdominal tenderness.  Genitourinary:    General: Normal vulva.     Exam position: Lithotomy position.     Labia:        Right: No rash, tenderness, lesion or injury.        Left: No rash, tenderness, lesion or injury.      Vagina: No signs of injury. No vaginal discharge or tenderness.     Cervix: Cervical motion tenderness and erythema (min at 5:00) present. No discharge, friability, lesion, cervical bleeding or eversion.     Uterus: Not tender.   Skin:    General: Skin is warm and dry.  Neurological:     General: No focal deficit present.     Mental Status: She is alert and oriented to person, place, and time.  Psychiatric:        Mood and Affect: Mood normal.        Behavior: Behavior normal.    Results for orders placed or  performed in visit on 08/14/20  POCT Wet + KOH Prep  Result Value Ref Range   Yeast by KOH Absent Absent   Yeast by wet prep Absent Absent   WBC by wet prep Few Few   Clue Cells Wet Prep HPF POC Moderate (A) None   Trich by wet prep Absent Absent   Bacteria Wet Prep HPF POC Many (A) Few   Epithelial Cells By [employer] Pref ([facility]) Few None, Few, Too numerous to count   RBC,UR,HPF,POC None None RBC/hpf  POCT Microscopic Urinalysis ([facility])  Result Value Ref Range   WBC,UR,HPF,POC None None WBC/hpf   RBC,UR,HPF,POC None None RBC/hpf   Bacteria None None, Too numerous to count   Mucus Absent Absent   Epithelial Cells, UR Per Microscopy None None, Too numerous to count cells/hpf  POCT urinalysis dipstick  Result Value Ref Range   Color, UA yellow yellow   Clarity, UA clear clear   Glucose, UA negative negative mg/dL   Bilirubin, UA negative negative   Ketones, POC UA negative negative mg/dL   Spec Grav, UA 1.025 1.010 - 1.025   Blood, UA trace-lysed (A) negative   pH, UA 5.5 5.0 - 8.0   Protein Ur, POC negative negative mg/dL   Urobilinogen, UA 0.2 0.2 or 1.0 E.U./dL   Nitrite, UA Negative Negative   Leukocytes, UA Negative Negative   51 minutes spent during visit, greater than 50% counseling and assimilation of information, chart review, and discussion of plan.    Assessment & Plan:  Frances Conley is a 59 y.o. female  Fibroids - Plan: Ambulatory referral to Gynecology Bacterial vaginosis - Plan: metroNIDAZOLE (FLAGYL) 500 MG tablet Vaginal discharge - Plan: POCT Wet + KOH Prep, Cytology -  PAP([facility]) Abdominal cramping - Plan: POCT Microscopic Urinalysis ([facility]), POCT urinalysis dipstick  - start metronidazole for BV. Unlikely STI - check pap with GC/CT. Hx of fibroids,  refer to GYN.  rtc precautions.   History of ulcer disease - Plan: omeprazole (PRILOSEC) 20 MG capsule  - PPI QD recommended  Low serum vitamin D - Plan: Vitamin D, 25-hydroxy  - check labs to decide on meds.     Low TSH level - Plan: TSH + free T4  - possible hyperthyroid. Check labs.   Screening for HIV (human immunodeficiency virus) - Plan: HIV Antibody (routine testing w rflx)  Hyperlipidemia, unspecified hyperlipidemia type - Plan: Lipid panel, Comprehensive metabolic panel  Hypernatremia - Plan: Comprehensive metabolic panel  Screening for cervical cancer - Plan: Cytology - PAP([facility])    Meds ordered this encounter  Medications  . omeprazole (PRILOSEC) 20 MG capsule    Sig: Take 1 capsule (20 mg total) by mouth daily.    Dispense:  30 capsule    Refill:  3  . metroNIDAZOLE (FLAGYL) 500 MG tablet    Sig: 1 pill by mouth twice per day.  Avoid any alcohol while taking this medicine.    Dispense:  14 tablet    Refill:  0   Patient Instructions    Start simvastatin every night and we can recheck levels in 6 weeks.   Continue to avoid nsaids, restart omeprazole daily.   I will check other labs today and will discuss labs at follow up visit.   Start metronidazole for possible bacterial vaginosis.  I will also refer you to OB/GYN to discuss previous fibroids.  If abdominal cramping is not improving this weekend, or any worsening, return here or urgent care.  Bacterial Vaginosis  Bacterial vaginosis is a vaginal infection that occurs when the normal balance of bacteria in the vagina is disrupted. It results from an overgrowth of certain bacteria. This is the most common vaginal infection among women ages 17-44. Because bacterial vaginosis increases your risk for STIs (sexually transmitted infections), getting treated can help reduce your risk for chlamydia, gonorrhea, herpes, and HIV (human immunodeficiency virus). Treatment is also important for preventing complications in pregnant women, because this condition can cause an early (premature) delivery. What are the causes? This condition is caused by an increase in harmful bacteria that are normally present in small amounts in  the vagina. However, the reason that the condition develops is not fully understood. What increases the risk? The following factors may make you more likely to develop this condition:  Having a new sexual partner or multiple sexual partners.  Having unprotected sex.  Douching.  Having an intrauterine device (IUD).  Smoking.  Drug and alcohol abuse.  Taking certain antibiotic medicines.  Being pregnant. You cannot get bacterial vaginosis from toilet seats, bedding, swimming pools, or contact with objects around you. What are the signs or symptoms? Symptoms of this condition include:  Grey or white vaginal discharge. The discharge can also be watery or foamy.  A fish-like odor with discharge, especially after sexual intercourse or during menstruation.  Itching in and around the vagina.  Burning or pain with urination. Some women with bacterial vaginosis have no signs or symptoms. How is this diagnosed? This condition is diagnosed based on:  Your medical history.  A physical exam of the vagina.  Testing a sample of vaginal fluid under a microscope to look for a large amount of bad bacteria or abnormal cells. Your health care  provider may use a cotton swab or a small wooden spatula to collect the sample. How is this treated? This condition is treated with antibiotics. These may be given as a pill, a vaginal cream, or a medicine that is put into the vagina (suppository). If the condition comes back after treatment, a second round of antibiotics may be needed. Follow these instructions at home: Medicines  Take over-the-counter and prescription medicines only as told by your health care provider.  Take or use your antibiotic as told by your health care provider. Do not stop taking or using the antibiotic even if you start to feel better. General instructions  If you have a female sexual partner, tell her that you have a vaginal infection. She should see her health care  provider and be treated if she has symptoms. If you have a female sexual partner, he does not need treatment.  During treatment: ? Avoid sexual activity until you finish treatment. ? Do not douche. ? Avoid alcohol as directed by your health care provider. ? Avoid breastfeeding as directed by your health care provider.  Drink enough water and fluids to keep your urine clear or pale yellow.  Keep the area around your vagina and rectum clean. ? Wash the area daily with warm water. ? Wipe yourself from front to back after using the toilet.  Keep all follow-up visits as told by your health care provider. This is important. How is this prevented?  Do not douche.  Wash the outside of your vagina with warm water only.  Use protection when having sex. This includes latex condoms and dental dams.  Limit how many sexual partners you have. To help prevent bacterial vaginosis, it is best to have sex with just one partner (monogamous).  Make sure you and your sexual partner are tested for STIs.  Wear cotton or cotton-lined underwear.  Avoid wearing tight pants and pantyhose, especially during summer.  Limit the amount of alcohol that you drink.  Do not use any products that contain nicotine or tobacco, such as cigarettes and e-cigarettes. If you need help quitting, ask your health care provider.  Do not use illegal drugs. Where to find more information  Centers for Disease Control and Prevention: AppraiserFraud.fi  American Sexual Health Association (ASHA): www.ashastd.org  U.S. Department of Health and Financial controller, Office on Women's Health: DustingSprays.pl or SecuritiesCard.it Contact a health care provider if:  Your symptoms do not improve, even after treatment.  You have more discharge or pain when urinating.  You have a fever.  You have pain in your abdomen.  You have pain during sex.  You have vaginal bleeding between  periods. Summary  Bacterial vaginosis is a vaginal infection that occurs when the normal balance of bacteria in the vagina is disrupted.  Because bacterial vaginosis increases your risk for STIs (sexually transmitted infections), getting treated can help reduce your risk for chlamydia, gonorrhea, herpes, and HIV (human immunodeficiency virus). Treatment is also important for preventing complications in pregnant women, because the condition can cause an early (premature) delivery.  This condition is treated with antibiotic medicines. These may be given as a pill, a vaginal cream, or a medicine that is put into the vagina (suppository). This information is not intended to replace advice given to you by your health care provider. Make sure you discuss any questions you have with your health care provider. Document Revised: 11/03/2017 Document Reviewed: 08/06/2016 Elsevier Patient Education  [employer].   Return to the clinic  or go to the nearest emergency room if any of your symptoms worsen or new symptoms occur.      If you have lab work done today you will be contacted with your lab results within the next 2 weeks.  If you have not heard from [place] then please contact us. The fastest way to get your results is to register for My Chart.   IF you received an x-ray today, you will receive an invoice from [facility]. Please contact [facility] at [phone] with questions or concerns regarding your invoice.   IF you received labwork today, you will receive an invoice from [facility]. Please contact [facility] at [phone] with questions or concerns regarding your invoice.   Our billing staff will not be able to assist you with questions regarding bills from these companies.  You will be contacted with the lab results as soon as they are available. The fastest way to get your results is to activate your My Chart account. Instructions are located on the last page of this  paperwork. If you have not heard from [place] regarding the results in 2 weeks, please contact this office.          Signed, Merri Ray, MD [facility]

## 2020-08-15 ENCOUNTER — Encounter: Payer: Self-pay | Admitting: Family Medicine

## 2020-08-18 LAB — CYTOLOGY - PAP
Chlamydia: NEGATIVE
Comment: NEGATIVE
Comment: NEGATIVE
Comment: NORMAL
Diagnosis: NEGATIVE
Diagnosis: REACTIVE
Neisseria Gonorrhea: NEGATIVE
Trichomonas: NEGATIVE

## 2020-08-18 LAB — LIPID PANEL

## 2020-08-18 LAB — COMPREHENSIVE METABOLIC PANEL

## 2020-08-18 LAB — TSH+FREE T4

## 2020-08-18 LAB — VITAMIN D 25 HYDROXY (VIT D DEFICIENCY, FRACTURES)

## 2020-08-18 LAB — HIV ANTIBODY (ROUTINE TESTING W REFLEX)

## 2020-08-19 ENCOUNTER — Telehealth: Payer: Self-pay | Admitting: Family Medicine

## 2020-08-19 NOTE — Telephone Encounter (Signed)
Labs sent via MyChart

## 2020-08-19 NOTE — Telephone Encounter (Signed)
Pt would like a cb concerning her most recent lab results. Please advise at [phone].

## 2020-09-09 ENCOUNTER — Telehealth: Payer: Self-pay | Admitting: Family Medicine

## 2020-09-09 NOTE — Telephone Encounter (Signed)
LVMTCB to sch appt as a virtual with provider. Appt is already resch as virtual if pt doesn't want it we can resch. Please advise.

## 2020-09-09 NOTE — Telephone Encounter (Signed)
Pt calle dback and stated she will do the virtual appt with provider.

## 2020-09-09 NOTE — Telephone Encounter (Signed)
Pt called and is needing a refill of this Rx because pt stated she has miss placed the medication. Please advise.  simvastatin (ZOCOR) 40 MG tablet [[id number]]

## 2020-09-10 ENCOUNTER — Other Ambulatory Visit: Payer: Self-pay | Admitting: Emergency Medicine

## 2020-09-10 DIAGNOSIS — E782 Mixed hyperlipidemia: Secondary | ICD-10-CM

## 2020-09-10 MED ORDER — SIMVASTATIN 40 MG PO TABS: 40.0000 mg | ORAL_TABLET | Freq: Every day | ORAL | 3 refills | Status: DC

## 2020-09-10 NOTE — Telephone Encounter (Signed)
Rx has been sent  

## 2020-09-18 ENCOUNTER — Telehealth (INDEPENDENT_AMBULATORY_CARE_PROVIDER_SITE_OTHER): Payer: 59 | Admitting: Family Medicine

## 2020-09-18 ENCOUNTER — Other Ambulatory Visit: Payer: Self-pay

## 2020-09-18 ENCOUNTER — Encounter: Payer: Self-pay | Admitting: Family Medicine

## 2020-09-18 ENCOUNTER — Ambulatory Visit (INDEPENDENT_AMBULATORY_CARE_PROVIDER_SITE_OTHER): Payer: 59 | Admitting: Family Medicine

## 2020-09-18 VITALS — BP 128/90 | HR 66 | Temp 98.0°F | Ht 63.0 in | Wt 180.2 lb

## 2020-09-18 DIAGNOSIS — R002 Palpitations: Secondary | ICD-10-CM | POA: Diagnosis not present

## 2020-09-18 DIAGNOSIS — E782 Mixed hyperlipidemia: Secondary | ICD-10-CM | POA: Diagnosis not present

## 2020-09-18 DIAGNOSIS — Z7189 Other specified counseling: Secondary | ICD-10-CM

## 2020-09-18 DIAGNOSIS — Z114 Encounter for screening for human immunodeficiency virus [HIV]: Secondary | ICD-10-CM

## 2020-09-18 DIAGNOSIS — R7989 Other specified abnormal findings of blood chemistry: Secondary | ICD-10-CM

## 2020-09-18 NOTE — Progress Notes (Signed)
Virtual Visit via Telephone Note  I connected with Frances Conley on 09/18/20 at 1:32 PM by telephone and verified that I am speaking with the correct person using two identifiers. Patient location: at office.  My location: home    I discussed the limitations, risks, security and privacy concerns of performing an evaluation and management service by telephone and the availability of in person appointments. I also discussed with the patient that there may be a patient responsible charge related to this service. The patient expressed understanding and agreed to proceed, consent obtained  Chief complaint: Chief Complaint  Patient presents with  . lab review    TSH  . Medication Management    History of Present Illness: Frances Conley is a 59 y.o. female Presents today for: Chief Complaint  Patient presents with  . lab review    TSH  . Medication Management   Follow up from 9/10 new patient visit.  Plan for repeat labs for vit D, lipids, tsh, CMP - unable to process last visit.   Abnormal TSH:  low tsh of 0.397 on 06/19/20, 0.215 in July 2017  Referred to endocrinology in past, but was not see.  No current thyroid meds. Possible component of proptosis noted on exam last visit. Plan for repeat tsh, free t4 but blood test was not able to be processed. Has not had repeat test yet.   Some heat intolerance with activity - feels overheated, more last 6 months.  Dry hair/brittle some loss more than usual.  Occasional heart palpitations -  Beats a little harder or faster - about 10 seconds.  Not daily, just occasionally - about once per month. Last episode few weeks ago. Not currently. Some fatigue.   Wt Readings from Last 3 Encounters:  09/18/20 178 lb (80.7 kg)  08/14/20 179 lb (81.2 kg)  06/19/20 178 lb 6.4 oz (80.9 kg)    Lab Results  Component Value Date   TSH CANCELED 08/14/2020   Someone at [employer] day[employer] had covid last week. Grandchildren have been ok and they have  been home this week. Grandchild that was in class has no symptoms. She has been around her grandchildren.  Patient does not have any illness symptoms, but she has not.    There are no problems to display for this patient.  Past Medical History:  Diagnosis Date  . Arthritis    both hips, hands, shoulders goes to Dr Gladstone Lighter, Jori Moll ortho md  . FXTKWIOX(735.3)   . Hypercholesteremia   . Hypertension   . Ulcer    Past Surgical History:  Procedure Laterality Date  . [place]  . TUBAL LIGATION    . UTERINE FIBROID SURGERY     No Known Allergies Prior to Admission medications   Medication Sig Start Date End Date Taking? Authorizing Provider  hydrochlorothiazide (HYDRODIURIL) 25 MG tablet Take 1 tablet (25 mg total) by mouth daily. 06/19/20  Yes Posey Boyer, MD  HYDROcodone-acetaminophen (NORCO/VICODIN) 5-325 MG tablet Take one tablet at night for pain; may take up to every 6 hours as needed for pain if not working or driving 2/99/24  Yes [provider]  omeprazole (PRILOSEC) 20 MG capsule Take 1 capsule (20 mg total) by mouth daily. 08/14/20  Yes Wendie Agreste, MD  simvastatin (ZOCOR) 40 MG tablet Take 1 tablet (40 mg total) by mouth daily. 09/10/20  Yes Posey Boyer, MD  loratadine (CLARITIN) 10 MG tablet Take  10 mg by mouth daily. Patient not taking: Reported on 09/18/2020 02/22/19   [provider]  metroNIDAZOLE (FLAGYL) 500 MG tablet 1 pill by mouth twice per day.  Avoid any alcohol while taking this medicine. Patient not taking: Reported on 09/18/2020 08/14/20   Wendie Agreste, MD  predniSONE (DELTASONE) 5 MG tablet Take 5 mg by mouth daily with breakfast.  Patient not taking: Reported on 09/18/2020    [provider]   Social History   Socioeconomic History  . Marital status: Legally Separated    Spouse name: Not on file  . Number of children: Not on file  . Years of education: Not on file  . Highest  education level: Not on file  Occupational History  . Not on file  Tobacco Use  . Smoking status: Former Smoker    Packs/day: 0.50    Types: Cigarettes  . Smokeless tobacco: Never Used  Vaping Use  . Vaping Use: Never used  Substance and Sexual Activity  . Alcohol use: No  . Drug use: Never  . Sexual activity: Not on file  Other Topics Concern  . Not on file  Social History Narrative  . Not on file   Social Determinants of Health   Financial Resource Strain:   . Difficulty of Paying Living Expenses: Not on file  Food Insecurity:   . Worried About Charity fundraiser in the Last Year: Not on file  . Ran Out of Food in the Last Year: Not on file  Transportation Needs:   . Lack of Transportation (Medical): Not on file  . Lack of Transportation (Non-Medical): Not on file  Physical Activity:   . Days of Exercise per Week: Not on file  . Minutes of Exercise per Session: Not on file  Stress:   . Feeling of Stress : Not on file  Social Connections:   . Frequency of Communication with Friends and Family: Not on file  . Frequency of Social Gatherings with Friends and Family: Not on file  . Attends Religious Services: Not on file  . Active Member of Clubs or Organizations: Not on file  . Attends Archivist Meetings: Not on file  . Marital Status: Not on file  Intimate Partner Violence:   . Fear of Current or Ex-Partner: Not on file  . Emotionally Abused: Not on file  . Physically Abused: Not on file  . Sexually Abused: Not on file     Observations/Objective: Vitals:   09/18/20 0912  BP: 128/90  Pulse: 66  Temp: 98 F (36.7 C)  TempSrc: Temporal  SpO2: 98%  Weight: 178 lb (80.7 kg)  Height: 5\' 3"  (1.6 m)  no distress, speaking in full sentences.  All questions answered and understanding expressed.   EKG: Sinus bradycardia, rate 54, RSR in V1, no acute findings.  No apparent acute changes, compared to September 2020.  Assessment and Plan: Mixed  hyperlipidemia - Plan: Lipid panel, Comprehensive metabolic panel  - labs pending.   Low TSH level - Plan: TSH + free T4, Thyroglobulin Level  - suspicious for hyperthyroidism by by symptoms and with prior low TSH.  Check TSH, free T4, thyroglobulin antibody, consider ultrasound and likely endocrinology eval.    Low serum vitamin D - Plan: VITAMIN D 25 Hydroxy (Vit-D Deficiency, Fractures)  Screening for HIV (human immunodeficiency virus) - Plan: HIV Antibody (routine testing w rflx), HIV Antibody (routine testing w rflx), CANCELED: HIV Antibody (routine testing w rflx)  Counseled about  COVID-19 virus infection -  Indirect possible contact with COVID-19 in her grandchild's classroom, but no direct contact medicine and that grandchild is asymptomatic.  Patient is also asymptomatic.  Potential exposure from grandchild 1 week ago.  Recommended watching for any new symptoms over the next week and testing/evaluation at that time if this does occur.   Palpitations - Plan: EKG 12-Lead Rare/infrequent.  May be related to thyroid, check labs as above.  EKG reassuring in office.   Follow Up Instructions: Patient Instructions    If thyroid test is still low, I do suspect overactive thyroid. I will refer you to endocrinology if that is the case.   I am glad to hear that you are feeling well, but with your grandchildren having a classmate with COVID-19, I would watch for any new symptoms over the next 1 week.  That can be fever, cough, new sore throat, congestion, loss of taste or smell, new body aches, new stomach symptoms such as diarrhea, nausea or vomiting.  If any of those symptoms occur, make sure to be tested/evaluated for COVID-19.  Thanks for coming in for bloodwork today.      If you have lab work done today you will be contacted with your lab results within the next 2 weeks.  If you have not heard from [place] then please contact us. The fastest way to get your results is to register for My  Chart.   IF you received an x-ray today, you will receive an invoice from [facility]. Please contact [facility] at [phone] with questions or concerns regarding your invoice.   IF you received labwork today, you will receive an invoice from [place]. Please contact [facility] at [phone] with questions or concerns regarding your invoice.   Our billing staff will not be able to assist you with questions regarding bills from these companies.  You will be contacted with the lab results as soon as they are available. The fastest way to get your results is to activate your My Chart account. Instructions are located on the last page of this paperwork. If you have not heard from [place] regarding the results in 2 weeks, please contact this office.          I discussed the assessment and treatment plan with the patient. The patient was provided an opportunity to ask questions and all were answered. The patient agreed with the plan and demonstrated an understanding of the instructions.   The patient was advised to call back or seek an in-person evaluation if the symptoms worsen or if the condition fails to improve as anticipated.  I provided 30 minutes of non-face-to-face time during this encounter.  Signed,   Merri Ray, MD [facility].  09/18/20

## 2020-09-18 NOTE — Patient Instructions (Addendum)
  If thyroid test is still low, I do suspect overactive thyroid. I will refer you to endocrinology if that is the case.   I am glad to hear that you are feeling well, but with your grandchildren having a classmate with COVID-19, I would watch for any new symptoms over the next 1 week.  That can be fever, cough, new sore throat, congestion, loss of taste or smell, new body aches, new stomach symptoms such as diarrhea, nausea or vomiting.  If any of those symptoms occur, make sure to be tested/evaluated for COVID-19.  Thanks for coming in for bloodwork today.      If you have lab work done today you will be contacted with your lab results within the next 2 weeks.  If you have not heard from [place] then please contact us. The fastest way to get your results is to register for My Chart.   IF you received an x-ray today, you will receive an invoice from [facility]. Please contact [facility] at [phone] with questions or concerns regarding your invoice.   IF you received labwork today, you will receive an invoice from [place]. Please contact [facility] at [phone] with questions or concerns regarding your invoice.   Our billing staff will not be able to assist you with questions regarding bills from these companies.  You will be contacted with the lab results as soon as they are available. The fastest way to get your results is to activate your My Chart account. Instructions are located on the last page of this paperwork. If you have not heard from [place] regarding the results in 2 weeks, please contact this office.

## 2020-09-19 LAB — LIPID PANEL
Chol/HDL Ratio: 5.3 ratio — ABNORMAL HIGH (ref 0.0–4.4)
Cholesterol, Total: 186 mg/dL (ref 100–199)
HDL: 35 mg/dL — ABNORMAL LOW (ref >39)
LDL Chol Calc (NIH): 131 mg/dL — ABNORMAL HIGH (ref 0–99)
Triglycerides: 108 mg/dL (ref 0–149)
VLDL Cholesterol Cal: 20 mg/dL (ref 5–40)

## 2020-09-19 LAB — TSH+FREE T4
Free T4: 0.99 ng/dL (ref 0.82–1.77)
TSH: 0.529 u[IU]/mL (ref 0.450–4.500)

## 2020-09-19 LAB — COMPREHENSIVE METABOLIC PANEL
ALT: 17 IU/L (ref 0–32)
AST: 17 IU/L (ref 0–40)
Albumin/Globulin Ratio: 1.6 (ref 1.2–2.2)
Albumin: 4.4 g/dL (ref 3.8–4.9)
Alkaline Phosphatase: 100 IU/L (ref 44–121)
BUN/Creatinine Ratio: 14 (ref 9–23)
BUN: 12 mg/dL (ref 6–24)
Bilirubin Total: 0.6 mg/dL (ref 0.0–1.2)
CO2: 27 mmol/L (ref 20–29)
Calcium: 9.2 mg/dL (ref 8.7–10.2)
Chloride: 100 mmol/L (ref 96–106)
Creatinine, Ser: 0.84 mg/dL (ref 0.57–1.00)
GFR calc Af Amer: 89 mL/min/{1.73_m2} (ref >59)
GFR calc non Af Amer: 77 mL/min/{1.73_m2} (ref >59)
Globulin, Total: 2.7 g/dL (ref 1.5–4.5)
Glucose: 96 mg/dL (ref 65–99)
Potassium: 3.3 mmol/L — ABNORMAL LOW (ref 3.5–5.2)
Sodium: 143 mmol/L (ref 134–144)
Total Protein: 7.1 g/dL (ref 6.0–8.5)

## 2020-09-19 LAB — HIV ANTIBODY (ROUTINE TESTING W REFLEX): HIV Screen 4th Generation wRfx: NONREACTIVE

## 2020-09-19 LAB — VITAMIN D 25 HYDROXY (VIT D DEFICIENCY, FRACTURES): Vit D, 25-Hydroxy: 16.5 ng/mL — ABNORMAL LOW (ref 30.0–100.0)

## 2020-09-28 ENCOUNTER — Telehealth: Payer: Self-pay | Admitting: Emergency Medicine

## 2020-09-28 ENCOUNTER — Other Ambulatory Visit: Payer: Self-pay

## 2020-09-28 ENCOUNTER — Telehealth (INDEPENDENT_AMBULATORY_CARE_PROVIDER_SITE_OTHER): Payer: 59 | Admitting: Emergency Medicine

## 2020-09-28 DIAGNOSIS — Z20822 Contact with and (suspected) exposure to covid-19: Secondary | ICD-10-CM | POA: Diagnosis not present

## 2020-09-28 DIAGNOSIS — R6889 Other general symptoms and signs: Secondary | ICD-10-CM

## 2020-09-28 NOTE — Telephone Encounter (Signed)
No answer

## 2020-09-28 NOTE — Progress Notes (Addendum)
Telemedicine Encounter- SOAP NOTE Established Patient Patient: Home  Provider: Office     This telephone encounter was conducted with the patient's (or proxy's) verbal consent via audio telecommunications: yes/no: Yes Patient was instructed to have this encounter in a suitably private space; and to only have persons present to whom they give permission to participate. In addition, patient identity was confirmed by use of name plus two identifiers (DOB and address).  I discussed the limitations, risks, security and privacy concerns of performing an evaluation and management service by telephone and the availability of in person appointments. I also discussed with the patient that there may be a patient responsible charge related to this service. The patient expressed understanding and agreed to proceed.  I spent a total of TIME; 0 MIN TO 60 MIN: 20 minutes talking with the patient or their proxy.  Chief Complaint  Patient presents with  . Covid Exposure    pt grandchildren are positive for Covid, cough SOB, chills, started 2 days ago, pt is not Covid vaccinated, pt has not had fever,     Subjective   Frances Conley is a 59 y.o. female established patient. Telephone visit today complaining of flulike symptoms that started 2 days ago with chest congestion, cough, diarrhea, chills and generalized achiness.  Exposed to grandchildren with Covid.  Not vaccinated.  Denies fever or difficulty breathing.  No other complaints or medical concerns today.  HPI   There are no problems to display for this patient.   Past Medical History:  Diagnosis Date  . Arthritis    both hips, hands, shoulders goes to Dr Gladstone Lighter, Jori Moll ortho md  . ZOXWRUEA(540.9)   . Hypercholesteremia   . Hypertension   . Ulcer     Current Outpatient Medications  Medication Sig Dispense Refill  . hydrochlorothiazide (HYDRODIURIL) 25 MG tablet Take 1 tablet (25 mg total) by mouth daily. 30 tablet 3  .  HYDROcodone-acetaminophen (NORCO/VICODIN) 5-325 MG tablet Take one tablet at night for pain; may take up to every 6 hours as needed for pain if not working or driving    . loratadine (CLARITIN) 10 MG tablet Take 10 mg by mouth daily.     . metroNIDAZOLE (FLAGYL) 500 MG tablet 1 pill by mouth twice per day.  Avoid any alcohol while taking this medicine. 14 tablet 0  . omeprazole (PRILOSEC) 20 MG capsule Take 1 capsule (20 mg total) by mouth daily. 30 capsule 3  . predniSONE (DELTASONE) 5 MG tablet Take 5 mg by mouth daily with breakfast.     . simvastatin (ZOCOR) 40 MG tablet Take 1 tablet (40 mg total) by mouth daily. 30 tablet 3   No current facility-administered medications for this visit.    No Known Allergies  Social History   Socioeconomic History  . Marital status: Legally Separated    Spouse name: Not on file  . Number of children: Not on file  . Years of education: Not on file  . Highest education level: Not on file  Occupational History  . Not on file  Tobacco Use  . Smoking status: Former Smoker    Packs/day: 0.50    Types: Cigarettes  . Smokeless tobacco: Never Used  Vaping Use  . Vaping Use: Never used  Substance and Sexual Activity  . Alcohol use: No  . Drug use: Never  . Sexual activity: Not on file  Other Topics Concern  . Not on file  Social History Narrative  . Not  on file   Social Determinants of Health   Financial Resource Strain:   . Difficulty of Paying Living Expenses: Not on file  Food Insecurity:   . Worried About Charity fundraiser in the Last Year: Not on file  . Ran Out of Food in the Last Year: Not on file  Transportation Needs:   . Lack of Transportation (Medical): Not on file  . Lack of Transportation (Non-Medical): Not on file  Physical Activity:   . Days of Exercise per Week: Not on file  . Minutes of Exercise per Session: Not on file  Stress:   . Feeling of Stress : Not on file  Social Connections:   . Frequency of Communication  with Friends and Family: Not on file  . Frequency of Social Gatherings with Friends and Family: Not on file  . Attends Religious Services: Not on file  . Active Member of Clubs or Organizations: Not on file  . Attends Archivist Meetings: Not on file  . Marital Status: Not on file  Intimate Partner Violence:   . Fear of Current or Ex-Partner: Not on file  . Emotionally Abused: Not on file  . Physically Abused: Not on file  . Sexually Abused: Not on file    Review of Systems  Constitutional: Positive for chills. Negative for fever.  HENT: Positive for congestion. Negative for sore throat.   Respiratory: Positive for cough. Negative for shortness of breath.   Cardiovascular: Negative.  Negative for chest pain and palpitations.  Gastrointestinal: Positive for diarrhea. Negative for abdominal pain, nausea and vomiting.  Musculoskeletal: Positive for myalgias.  Skin: Negative.  Negative for rash.  Neurological: Negative for dizziness and headaches.  All other systems reviewed and are negative.   Objective  Alert and oriented x3 in no apparent respiratory distress Vitals as reported by the patient: There were no vitals filed for this visit.  There are no diagnoses linked to this encounter. Frances Conley was seen today for covid exposure.  Diagnoses and all orders for this visit:  Flu-like symptoms  Encounter for screening laboratory testing for COVID-19 virus -     Novel Coronavirus, NAA ([facility])  Exposure to COVID-19 virus  Clinically stable.  No red flag signs or symptoms.  Most likely patient has a Covid infection.  ED precautions given.  Advised to contact the office if no better or worse in the next several days.   I discussed the assessment and treatment plan with the patient. The patient was provided an opportunity to ask questions and all were answered. The patient agreed with the plan and demonstrated an understanding of the instructions.   The patient was advised  to call back or seek an in-person evaluation if the symptoms worsen or if the condition fails to improve as anticipated.  I provided 20 minutes of non-face-to-face time during this encounter.  Horald Pollen, MD  Primary Care at [facility]

## 2020-09-28 NOTE — Patient Instructions (Signed)
° ° ° °  If you have lab work done today you will be contacted with your lab results within the next 2 weeks.  If you have not heard from us then please contact us. The fastest way to get your results is to register for My Chart. ° ° °IF you received an x-ray today, you will receive an invoice from [facility]. Please contact [place] [facility] at [phone] with questions or concerns regarding your invoice.  ° °IF you received labwork today, you will receive an invoice from [facility]. Please contact [facility] at [phone] with questions or concerns regarding your invoice.  ° °Our billing staff will not be able to assist you with questions regarding bills from these companies. ° °You will be contacted with the lab results as soon as they are available. The fastest way to get your results is to activate your My Chart account. Instructions are located on the last page of this paperwork. If you have not heard from us regarding the results in 2 weeks, please contact this office. °  ° ° ° °

## 2020-09-29 LAB — THYROGLOBULIN LEVEL: Thyroglobulin (TG-RIA): 49 ng/mL — ABNORMAL HIGH

## 2020-09-29 LAB — NOVEL CORONAVIRUS, NAA: SARS-CoV-2, NAA: NOT DETECTED

## 2020-09-29 LAB — SARS-COV-2, NAA 2 DAY TAT

## 2020-09-30 ENCOUNTER — Encounter: Payer: Self-pay | Admitting: Family Medicine

## 2020-10-07 ENCOUNTER — Telehealth: Payer: Self-pay | Admitting: Family Medicine

## 2020-10-07 NOTE — Telephone Encounter (Signed)
Pt states she dropped paperwork off at front desk and needed it faxed calling in with fax # [phone]

## 2020-10-07 NOTE — Telephone Encounter (Signed)
Pt has dropped off Health Review Visit Guide to be filled out by Dr. Carlota Raspberry. I have placed form at nurse's station. She wants to  call  back to  give [place] a fax#,  If not she would like a cb when form is completed for her to pick up. [phone]

## 2020-10-08 NOTE — Telephone Encounter (Signed)
fax # [phone] per note from [place].

## 2020-10-12 NOTE — Telephone Encounter (Signed)
Message sent thru MyChart for pt regarding forms that need to be filled out.

## 2020-10-15 ENCOUNTER — Telehealth: Payer: Self-pay | Admitting: Family Medicine

## 2020-10-15 NOTE — Telephone Encounter (Signed)
Pt called about paperwork that was wondering if it was sent off. Pt would like a call about it. Please advise.

## 2020-10-16 NOTE — Telephone Encounter (Deleted)
I have called pt to see what she was requesting since the medications have been sent on 10/12/2020. Pt reported that a PA was needed and she has been out of medication for the last couple of weeks. I have called pharmacy and confirmed that it was needed. I have called [facility] at [phone] and got it approved.   PA Approval for 36 months. PA: Adderall 20 XR is :[id number] DH:[id number] 10 BID is [phone]  I have called pharmacy and spoke to [facility], who confirmed the approval on his end. Rx will be $5 each  I have called pt back and relayed the message. She stated excitement.

## 2020-10-16 NOTE — Telephone Encounter (Signed)
I have spoken to [facility] and he stated that the paperwork was faxed on yesterday. I called pt and informed her of this along with confirming the upcoming appointment with DR. GREENE on Wed,, 10/21/20.

## 2020-10-21 ENCOUNTER — Encounter: Payer: Self-pay | Admitting: Family Medicine

## 2020-10-21 ENCOUNTER — Ambulatory Visit (INDEPENDENT_AMBULATORY_CARE_PROVIDER_SITE_OTHER): Payer: 59 | Admitting: Family Medicine

## 2020-10-21 ENCOUNTER — Other Ambulatory Visit: Payer: Self-pay

## 2020-10-21 VITALS — BP 136/88 | HR 98 | Temp 97.6°F | Ht 63.0 in | Wt 183.2 lb

## 2020-10-21 DIAGNOSIS — R7989 Other specified abnormal findings of blood chemistry: Secondary | ICD-10-CM

## 2020-10-21 DIAGNOSIS — H0520 Unspecified exophthalmos: Secondary | ICD-10-CM

## 2020-10-21 DIAGNOSIS — R079 Chest pain, unspecified: Secondary | ICD-10-CM

## 2020-10-21 DIAGNOSIS — Z87898 Personal history of other specified conditions: Secondary | ICD-10-CM

## 2020-10-21 DIAGNOSIS — I10 Essential (primary) hypertension: Secondary | ICD-10-CM | POA: Diagnosis not present

## 2020-10-21 DIAGNOSIS — E876 Hypokalemia: Secondary | ICD-10-CM

## 2020-10-21 DIAGNOSIS — J309 Allergic rhinitis, unspecified: Secondary | ICD-10-CM

## 2020-10-21 MED ORDER — OMEPRAZOLE 20 MG PO CPDR: 20.0000 mg | DELAYED_RELEASE_CAPSULE | Freq: Every day | ORAL | 3 refills | Status: DC

## 2020-10-21 MED ORDER — HYDROCHLOROTHIAZIDE 25 MG PO TABS: 25.0000 mg | ORAL_TABLET | Freq: Every day | ORAL | 3 refills | Status: DC

## 2020-10-21 MED ORDER — LORATADINE 10 MG PO TABS: 10.0000 mg | ORAL_TABLET | Freq: Every day | ORAL | 1 refills | Status: DC

## 2020-10-21 MED ORDER — POTASSIUM CHLORIDE ER 10 MEQ PO TBCR: 10.0000 meq | EXTENDED_RELEASE_TABLET | Freq: Every day | ORAL | 1 refills | Status: DC

## 2020-10-21 MED ORDER — VITAMIN D (ERGOCALCIFEROL) 1.25 MG (50000 UNIT) PO CAPS: 50000.0000 [IU] | ORAL_CAPSULE | ORAL | 0 refills | Status: DC

## 2020-10-21 NOTE — Progress Notes (Signed)
Subjective:  Patient ID: Frances Conley, female    DOB: [birth year]  Age: 59 y.o. MRN: [id number]  CC:  Chief Complaint  Patient presents with  . Medical Management of Chronic Issues  . Medication Refill    HPI Frances Conley presents for   Depression screen [facility] 2/9 10/21/2020 09/18/2020 08/14/2020 06/19/2020  Decreased Interest 0 0 0 0  Down, Depressed, Hopeless 0 0 0 0  PHQ - 2 Score 0 0 0 0   Overwhelmed. Some stress with work and life. Feels depressed. Denies SI. No prior treatment for depression. Stressor at home - working with it. Would like to meet with therapist. Feels this way for 5 months.  2 fleeting chest pains since last visit when overwhelmed, resolved in 30 seconds. 1 palpitation since last visit resolved quickly.    Hyperlipidemia: Off simvastatin few months, then restarted 10/7 - taking daily now.  Last ate 4-5 hrs ago.  Lab Results  Component Value Date   CHOL 186 09/18/2020   HDL 35 (L) 09/18/2020   LDLCALC 131 (H) 09/18/2020   TRIG 108 09/18/2020   CHOLHDL 5.3 (H) 09/18/2020   Lab Results  Component Value Date   ALT 17 09/18/2020   AST 17 09/18/2020   ALKPHOS 100 09/18/2020   BILITOT 0.6 09/18/2020     Hypertension: hctz 58m qd.  No new side effects.  Home readings:none.  BP Readings from Last 3 Encounters:  10/21/20 136/88  09/18/20 128/90  08/14/20 130/87   Lab Results  Component Value Date   CREATININE 0.84 09/18/2020   prilosec once per day, heartburn, prior peptic ulcer - no recent symptoms.   Low tsh: Low TSH at 0.215 in July 2017, 0.397 in July 2021.  Repeat testing October 15 normal at 0.529.  Planned on antithyroglobulin antibody, but thyroglobulin level was obtained which was mildly elevated. Has noticed prominent eyes past few years. Feels like yellow color past few years. Has had some weight gain since last visit. Drinks pepsi 4-5 during the day.  Some cold intolerance.  Did not meet with endocrinologist in past- but thinks had  thyroid biopsies. Possible thyroid nodules. Unsure of details. [facility].   Wt Readings from Last 3 Encounters:  10/21/20 183 lb 3.2 oz (83.1 kg)  09/18/20 180 lb 3.2 oz (81.7 kg)  08/14/20 179 lb (81.2 kg)   claritin working well for allergies.   Low serum vitamin D: Reading of 16.5 on 09/18/20. Not taking any supplement at this time. Prior supplement once per week.   Lab Results  Component Value Date   TSH 0.529 09/18/2020   Lab Results  Component Value Date   ALT 17 09/18/2020   AST 17 09/18/2020   ALKPHOS 100 09/18/2020   BILITOT 0.6 09/18/2020    Hypokalemia: Lab Results  Component Value Date   K 3.3 (L) 09/18/2020  no change in diet - has not added any potassium rich foods.   History There are no problems to display for this patient.  Past Medical History:  Diagnosis Date  . Arthritis    both hips, hands, shoulders goes to Dr GGladstone Lighter RJori Mollortho md  . HVHQIONGE([id number]   . Hypercholesteremia   . Hypertension   . Ulcer    Past Surgical History:  Procedure Laterality Date  . [place] . TUBAL LIGATION    . UTERINE FIBROID SURGERY  No Known Allergies Prior to Admission medications   Medication Sig Start Date End Date Taking? Authorizing Provider  hydrochlorothiazide (HYDRODIURIL) 25 MG tablet Take 1 tablet (25 mg total) by mouth daily. 06/19/20  Yes Posey Boyer, MD  HYDROcodone-acetaminophen (NORCO/VICODIN) 5-325 MG tablet Take one tablet at night for pain; may take up to every 6 hours as needed for pain if not working or driving 8/50/27  Yes [provider]  metroNIDAZOLE (FLAGYL) 500 MG tablet 1 pill by mouth twice per day.  Avoid any alcohol while taking this medicine. 08/14/20  Yes Wendie Agreste, MD  omeprazole (PRILOSEC) 20 MG capsule Take 1 capsule (20 mg total) by mouth daily. 08/14/20  Yes Wendie Agreste, MD  predniSONE (DELTASONE) 5 MG tablet Take  5 mg by mouth daily with breakfast.    Yes [provider]  simvastatin (ZOCOR) 40 MG tablet Take 1 tablet (40 mg total) by mouth daily. 09/10/20  Yes Posey Boyer, MD  loratadine (CLARITIN) 10 MG tablet Take 10 mg by mouth daily.  Patient not taking: Reported on 10/21/2020 02/22/19   [provider]   Social History   Socioeconomic History  . Marital status: Legally Separated    Spouse name: Not on file  . Number of children: Not on file  . Years of education: Not on file  . Highest education level: Not on file  Occupational History  . Not on file  Tobacco Use  . Smoking status: Former Smoker    Packs/day: 0.50    Types: Cigarettes  . Smokeless tobacco: Never Used  Vaping Use  . Vaping Use: Never used  Substance and Sexual Activity  . Alcohol use: No  . Drug use: Never  . Sexual activity: Not on file  Other Topics Concern  . Not on file  Social History Narrative  . Not on file   Social Determinants of Health   Financial Resource Strain:   . Difficulty of Paying Living Expenses: Not on file  Food Insecurity:   . Worried About Charity fundraiser in the Last Year: Not on file  . Ran Out of Food in the Last Year: Not on file  Transportation Needs:   . Lack of Transportation (Medical): Not on file  . Lack of Transportation (Non-Medical): Not on file  Physical Activity:   . Days of Exercise per Week: Not on file  . Minutes of Exercise per Session: Not on file  Stress:   . Feeling of Stress : Not on file  Social Connections:   . Frequency of Communication with Friends and Family: Not on file  . Frequency of Social Gatherings with Friends and Family: Not on file  . Attends Religious Services: Not on file  . Active Member of Clubs or Organizations: Not on file  . Attends Archivist Meetings: Not on file  . Marital Status: Not on file  Intimate Partner Violence:   . Fear of Current or Ex-Partner: Not on file  . Emotionally Abused: Not on  file  . Physically Abused: Not on file  . Sexually Abused: Not on file    Review of Systems  Constitutional: Positive for fatigue and unexpected weight change (weight gain. ).  Respiratory: Negative for chest tightness and shortness of breath.   Cardiovascular: Negative for chest pain, palpitations (1 palpitation since last visit, sharp pain in chest 30 seconds a few times. ) and leg swelling.  Gastrointestinal: Negative for abdominal pain and blood in stool.  Neurological: Negative for dizziness, syncope, light-headedness and headaches.    Per HPI Objective:   Vitals:   10/21/20 1444  BP: 136/88  Pulse: 98  Temp: 97.6 F (36.4 C)  TempSrc: Temporal  SpO2: 97%  Weight: 183 lb 3.2 oz (83.1 kg)  Height: '5\' 3"'  (1.6 m)     Physical Exam Vitals reviewed.  Constitutional:      Appearance: She is well-developed.  HENT:     Head: Normocephalic and atraumatic.  Eyes:     Conjunctiva/sclera: Conjunctivae normal.     Pupils: Pupils are equal, round, and reactive to light.     Comments: Appears to have bilateral proptosis.  Neck:     Vascular: No carotid bruit.     Comments: No apparent nodule palpated.  Nontender. Cardiovascular:     Rate and Rhythm: Normal rate and regular rhythm.     Heart sounds: Normal heart sounds.  Pulmonary:     Effort: Pulmonary effort is normal.     Breath sounds: Normal breath sounds.  Abdominal:     Palpations: Abdomen is soft. There is no pulsatile mass.     Tenderness: There is no abdominal tenderness.  Skin:    General: Skin is warm and dry.  Neurological:     Mental Status: She is alert and oriented to person, place, and time.  Psychiatric:        Behavior: Behavior normal.      EKG, sinus rhythm, RSR in V1.  No acute ST-T wave changes.No apparent significant changes from EKG on September 18, 2020.  52 minutes spent during visit, greater than 50% counseling and assimilation of information, chart review, and discussion of plan.     Assessment & Plan:  Frances Conley is a 59 y.o. female . Low serum vitamin D - Plan: Vitamin D, Ergocalciferol, (DRISDOL) 1.25 MG (50000 UNIT) CAPS capsule  -Start supplementation 50,000 units/week, recheck levels in approximately 6 weeks.  Essential hypertension - Plan: hydrochlorothiazide (HYDRODIURIL) 25 MG tablet  -Stable, continue hydrochlorothiazide same dose.  History of ulcer disease - Plan: omeprazole (PRILOSEC) 20 MG capsule  -Stable, asymptomatic, continue PPI.  Elevated TSH - Plan: Ambulatory referral to Endocrinology, TSH + free T4, Thyroid antibodies Proptosis  -On further discussion and review it does appear she has had previous biopsies of thyroid nodules, question whether or not she may have had hyperthyroidism that has been untreated contributing to proptosis.  Most recent TSH normal, could be downtrending.  Repeat TSH, free T4, thyroid antibodies and refer to endocrinology.  Likely will need updated imaging.  Hypokalemia - Plan: potassium chloride (KLOR-CON) 10 MEQ tablet  -Start potassium supplement 10 mEq daily.  Allergic rhinitis, unspecified seasonality, unspecified trigger - Plan: loratadine (CLARITIN) 10 MG tablet  -Continue Claritin for allergies.  Chest pain, unspecified type - Plan: EKG 12-Lead  -may be related to situational stressors, no concerning findings on EKG and atypical fleeting symptoms.   Handout given on stress.  Phone numbers provided for counseling, but will follow up closely in the next 1 month.  RTC/ER precautions if worse sooner.  Meds ordered this encounter  Medications  . hydrochlorothiazide (HYDRODIURIL) 25 MG tablet    Sig: Take 1 tablet (25 mg total) by mouth daily.    Dispense:  30 tablet    Refill:  3  . omeprazole (PRILOSEC) 20 MG capsule    Sig: Take 1 capsule (20 mg total) by mouth daily.    Dispense:  30 capsule    Refill:  3  . loratadine (CLARITIN) 10 MG tablet    Sig: Take 1 tablet (10 mg total) by mouth daily.     Dispense:  90 tablet    Refill:  1  . Vitamin D, Ergocalciferol, (DRISDOL) 1.25 MG (50000 UNIT) CAPS capsule    Sig: Take 1 capsule (50,000 Units total) by mouth every 7 (seven) days.    Dispense:  15 capsule    Refill:  0  . potassium chloride (KLOR-CON) 10 MEQ tablet    Sig: Take 1 tablet (10 mEq total) by mouth daily.    Dispense:  90 tablet    Refill:  1   Patient Instructions    start vitamin D supplement as well as low potassium supplement.  I will repeat thyroid test but will refer you to endocrinology as I am suspicious for overactive thyroid.  They can also discuss your previous thyroid nodules and likely will need repeat imaging.  That can be done with endocrinology.  If any return of chest pain be seen here or emergency room as we discussed.  For stress, see information on managing stress. Here are therapy numbers. Here are a few options for counseling:  [facility]:  [place] [phone]   Nonspecific Chest Pain, Adult Chest pain can be caused by many different conditions. It can be caused by a condition that is life-threatening and requires treatment right away. It can also be caused by something that is not life-threatening. If you have chest pain, it can be hard to know the difference, so it is important to get help right away to make sure that you do not have a serious condition. Some life-threatening causes of chest pain include:  Heart attack.  A tear in the body's main blood vessel (aortic dissection).  Inflammation around your heart (pericarditis).  A problem in the lungs, such as a blood clot (pulmonary embolism) or a collapsed lung (pneumothorax). Some non life-threatening causes of chest pain include:  Heartburn.  Anxiety or stress.  Damage to the bones, muscles, and cartilage that make up your chest wall.  Pneumonia or bronchitis.  Shingles infection (varicella-zoster virus). Chest  pain can feel like:  Pain or discomfort on the surface of your chest or deep in your chest.  Crushing, pressure, aching, or squeezing pain.  Burning or tingling.  Dull or sharp pain that is worse when you move, cough, or take a deep breath.  Pain or discomfort that is also felt in your back, neck, jaw, shoulder, or arm, or pain that spreads to any of these areas. Your chest pain may come and go. It may also be constant. Your health care provider will do lab tests and other studies to find the cause of your pain. Treatment will depend on the cause of your chest pain. Follow these instructions at home: Medicines  Take over-the-counter and prescription medicines only as told by your health care provider.  If you were prescribed an antibiotic, take it as told by your health care provider. Do not stop taking the antibiotic even if you start to feel better. Lifestyle   Rest as directed by your health care provider.  Do not use any products that contain nicotine or tobacco, such as cigarettes and e-cigarettes. If you need help quitting, ask your health care provider.  Do not drink alcohol.  Make healthy lifestyle choices as recommended. These may include: ? Getting regular exercise. Ask your health care provider to suggest some activities that are  safe for you. ? Eating a heart-healthy diet. This includes plenty of fresh fruits and vegetables, whole grains, low-fat (lean) protein, and low-fat dairy products. A dietitian can help you find healthy eating options. ? Maintaining a healthy weight. ? Managing any other health conditions you have, such as high blood pressure (hypertension) or diabetes. ? Reducing stress, such as with yoga or relaxation techniques. General instructions  Pay attention to any changes in your symptoms. Tell your health care provider about them or any new symptoms.  Avoid any activities that cause chest pain.  Keep all follow-up visits as told by your health care  provider. This is important. This includes visits for any further testing if your chest pain does not go away. Contact a health care provider if:  Your chest pain does not go away.  You feel depressed.  You have a fever. Get help right away if:  Your chest pain gets worse.  You have a cough that gets worse, or you cough up blood.  You have severe pain in your abdomen.  You faint.  You have sudden, unexplained chest discomfort.  You have sudden, unexplained discomfort in your arms, back, neck, or jaw.  You have shortness of breath at any time.  You suddenly start to sweat, or your skin gets clammy.  You feel nausea or you vomit.  You suddenly feel lightheaded or dizzy.  You have severe weakness, or unexplained weakness or fatigue.  Your heart begins to beat quickly, or it feels like it is skipping beats. These symptoms may represent a serious problem that is an emergency. Do not wait to see if the symptoms will go away. Get medical help right away. Call your local emergency services (911 in the [place] Do not drive yourself to the hospital. Summary  Chest pain can be caused by a condition that is serious and requires urgent treatment. It may also be caused by something that is not life-threatening.  If you have chest pain, it is very important to see your health care provider. Your health care provider may do lab tests and other studies to find the cause of your pain.  Follow your health care provider's instructions on taking medicines, making lifestyle changes, and getting emergency treatment if symptoms become worse.  Keep all follow-up visits as told by your health care provider. This includes visits for any further testing if your chest pain does not go away. This information is not intended to replace advice given to you by your health care provider. Make sure you discuss any questions you have with your health care provider. Document Revised: 05/24/2018 Document  Reviewed: 05/24/2018 Elsevier Patient Education  2020 [place], Adult Stress is a normal reaction to life events. Stress is what you feel when life demands more than you are used to, or more than you think you can handle. Some stress can be useful, such as studying for a test or meeting a deadline at work. Stress that occurs too often or for too long can cause problems. It can affect your emotional health and interfere with relationships and normal daily activities. Too much stress can weaken your body's defense system (immune system) and increase your risk for physical illness. If you already have a medical problem, stress can make it worse. What are the causes? All sorts of life events can cause stress. An event that causes stress for one person may not be stressful for another person. Major life events, whether positive or negative,  commonly cause stress. Examples include:  Losing a job or starting a new job.  Losing a loved one.  Moving to a new town or home.  Getting married or divorced.  Having a baby.  Getting injured or sick. Less obvious life events can also cause stress, especially if they occur day after day or in combination with each other. Examples include:  Working long hours.  Driving in traffic.  Caring for children.  Being in debt.  Being in a difficult relationship. What are the signs or symptoms? Stress can cause emotional symptoms, including:  Anxiety. This is feeling worried, afraid, on edge, overwhelmed, or out of control.  Anger, including irritation or impatience.  Depression. This is feeling sad, down, helpless, or guilty.  Trouble focusing, remembering, or making decisions. Stress can cause physical symptoms, including:  Aches and pains. These may affect your head, neck, back, stomach, or other areas of your body.  Tight muscles or a clenched jaw.  Low energy.  Trouble sleeping. Stress can cause unhealthy behaviors,  including:  Eating to feel better (overeating) or skipping meals.  Working too much or putting off tasks.  Smoking, drinking alcohol, or using drugs to feel better. How is this diagnosed? Stress is diagnosed through an assessment by your health care provider. He or she may diagnose this condition based on:  Your symptoms and any stressful life events.  Your medical history.  Tests to rule out other causes of your symptoms. Depending on your condition, your health care provider may refer you to a specialist for further evaluation. How is this treated?  Stress management techniques are the recommended treatment for stress. Medicine is not typically recommended for the treatment of stress. Techniques to reduce your reaction to stressful life events include:  Stress identification. Monitor yourself for symptoms of stress and identify what causes stress for you. These skills may help you to avoid or prepare for stressful events.  Time management. Set your priorities, keep a calendar of events, and learn to say no. Taking these actions can help you avoid making too many commitments. Techniques for coping with stress include:  Rethinking the problem. Try to think realistically about stressful events rather than ignoring them or overreacting. Try to find the positives in a stressful situation rather than focusing on the negatives.  Exercise. Physical exercise can release both physical and emotional tension. The key is to find a form of exercise that you enjoy and do it regularly.  Relaxation techniques. These relax the body and mind. The key is to find one or more that you enjoy and use the techniques regularly. Examples include: ? Meditation, deep breathing, or progressive relaxation techniques. ? Yoga or tai chi. ? Biofeedback, mindfulness techniques, or journaling. ? Listening to music, being out in nature, or participating in other hobbies.  Practicing a healthy lifestyle. Eat a  balanced diet, drink plenty of water, limit or avoid caffeine, and get plenty of sleep.  Having a strong support network. Spend time with family, friends, or other people you enjoy being around. Express your feelings and talk things over with someone you trust. Counseling or talk therapy with a mental health professional may be helpful if you are having trouble managing stress on your own. Follow these instructions at home: Lifestyle   Avoid drugs.  Do not use any products that contain nicotine or tobacco, such as cigarettes, e-cigarettes, and chewing tobacco. If you need help quitting, ask your health care provider.  Limit alcohol intake to no  more than 1 drink a day for nonpregnant women and 2 drinks a day for men. One drink equals 12 oz of beer, 5 oz of wine, or 1 oz of hard liquor  Do not use alcohol or drugs to relax.  Eat a balanced diet that includes fresh fruits and vegetables, whole grains, lean meats, fish, eggs, and beans, and low-fat dairy. Avoid processed foods and foods high in added fat, sugar, and salt.  Exercise at least 30 minutes on 5 or more days each week.  Get 7-8 hours of sleep each night. General instructions   Practice stress management techniques as discussed with your health care provider.  Drink enough fluid to keep your urine clear or pale yellow.  Take over-the-counter and prescription medicines only as told by your health care provider.  Keep all follow-up visits as told by your health care provider. This is important. Contact a health care provider if:  Your symptoms get worse.  You have new symptoms.  You feel overwhelmed by your problems and can no longer manage them on your own. Get help right away if:  You have thoughts of hurting yourself or others. If you ever feel like you may hurt yourself or others, or have thoughts about taking your own life, get help right away. You can go to your nearest emergency department or call:  Your local  emergency services (911 in the [place]  A suicide crisis helpline, such as the [place] at 8018054918. This is open 24 hours a day. Summary  Stress is a normal reaction to life events. It can cause problems if it happens too often or for too long.  Practicing stress management techniques is the best way to treat stress.  Counseling or talk therapy with a mental health professional may be helpful if you are having trouble managing stress on your own. This information is not intended to replace advice given to you by your health care provider. Make sure you discuss any questions you have with your health care provider. Document Revised: 06/21/2019 Document Reviewed: 01/11/2017 Elsevier Patient Education  [employer].   If you have lab work done today you will be contacted with your lab results within the next 2 weeks.  If you have not heard from [place] then please contact us. The fastest way to get your results is to register for My Chart.   IF you received an x-ray today, you will receive an invoice from [facility]. Please contact [facility] at [phone] with questions or concerns regarding your invoice.   IF you received labwork today, you will receive an invoice from [facility]. Please contact [facility] at [phone] with questions or concerns regarding your invoice.   Our billing staff will not be able to assist you with questions regarding bills from these companies.  You will be contacted with the lab results as soon as they are available. The fastest way to get your results is to activate your My Chart account. Instructions are located on the last page of this paperwork. If you have not heard from [place] regarding the results in 2 weeks, please contact this office.         Signed, Merri Ray, MD [facility]

## 2020-10-21 NOTE — Patient Instructions (Addendum)
start vitamin D supplement as well as low potassium supplement.  I will repeat thyroid test but will refer you to endocrinology as I am suspicious for overactive thyroid.  They can also discuss your previous thyroid nodules and likely will need repeat imaging.  That can be done with endocrinology.  If any return of chest pain be seen here or emergency room as we discussed.  For stress, see information on managing stress. Here are therapy numbers. Here are a few options for counseling:  [facility]:  [place] [phone]  Return to the clinic or go to the nearest emergency room if any of your symptoms worsen or new symptoms occur.    Nonspecific Chest Pain, Adult Chest pain can be caused by many different conditions. It can be caused by a condition that is life-threatening and requires treatment right away. It can also be caused by something that is not life-threatening. If you have chest pain, it can be hard to know the difference, so it is important to get help right away to make sure that you do not have a serious condition. Some life-threatening causes of chest pain include:  Heart attack.  A tear in the body's main blood vessel (aortic dissection).  Inflammation around your heart (pericarditis).  A problem in the lungs, such as a blood clot (pulmonary embolism) or a collapsed lung (pneumothorax). Some non life-threatening causes of chest pain include:  Heartburn.  Anxiety or stress.  Damage to the bones, muscles, and cartilage that make up your chest wall.  Pneumonia or bronchitis.  Shingles infection (varicella-zoster virus). Chest pain can feel like:  Pain or discomfort on the surface of your chest or deep in your chest.  Crushing, pressure, aching, or squeezing pain.  Burning or tingling.  Dull or sharp pain that is worse when you move, cough, or take a deep breath.  Pain or discomfort that is also  felt in your back, neck, jaw, shoulder, or arm, or pain that spreads to any of these areas. Your chest pain may come and go. It may also be constant. Your health care provider will do lab tests and other studies to find the cause of your pain. Treatment will depend on the cause of your chest pain. Follow these instructions at home: Medicines  Take over-the-counter and prescription medicines only as told by your health care provider.  If you were prescribed an antibiotic, take it as told by your health care provider. Do not stop taking the antibiotic even if you start to feel better. Lifestyle   Rest as directed by your health care provider.  Do not use any products that contain nicotine or tobacco, such as cigarettes and e-cigarettes. If you need help quitting, ask your health care provider.  Do not drink alcohol.  Make healthy lifestyle choices as recommended. These may include: ? Getting regular exercise. Ask your health care provider to suggest some activities that are safe for you. ? Eating a heart-healthy diet. This includes plenty of fresh fruits and vegetables, whole grains, low-fat (lean) protein, and low-fat dairy products. A dietitian can help you find healthy eating options. ? Maintaining a healthy weight. ? Managing any other health conditions you have, such as high blood pressure (hypertension) or diabetes. ? Reducing stress, such as with yoga or relaxation techniques. General instructions  Pay attention to any changes in your symptoms. Tell your health care provider about them or any new symptoms.  Avoid any activities that cause chest  pain.  Keep all follow-up visits as told by your health care provider. This is important. This includes visits for any further testing if your chest pain does not go away. Contact a health care provider if:  Your chest pain does not go away.  You feel depressed.  You have a fever. Get help right away if:  Your chest pain gets  worse.  You have a cough that gets worse, or you cough up blood.  You have severe pain in your abdomen.  You faint.  You have sudden, unexplained chest discomfort.  You have sudden, unexplained discomfort in your arms, back, neck, or jaw.  You have shortness of breath at any time.  You suddenly start to sweat, or your skin gets clammy.  You feel nausea or you vomit.  You suddenly feel lightheaded or dizzy.  You have severe weakness, or unexplained weakness or fatigue.  Your heart begins to beat quickly, or it feels like it is skipping beats. These symptoms may represent a serious problem that is an emergency. Do not wait to see if the symptoms will go away. Get medical help right away. Call your local emergency services (911 in the [place] Do not drive yourself to the hospital. Summary  Chest pain can be caused by a condition that is serious and requires urgent treatment. It may also be caused by something that is not life-threatening.  If you have chest pain, it is very important to see your health care provider. Your health care provider may do lab tests and other studies to find the cause of your pain.  Follow your health care provider's instructions on taking medicines, making lifestyle changes, and getting emergency treatment if symptoms become worse.  Keep all follow-up visits as told by your health care provider. This includes visits for any further testing if your chest pain does not go away. This information is not intended to replace advice given to you by your health care provider. Make sure you discuss any questions you have with your health care provider. Document Revised: 05/24/2018 Document Reviewed: 05/24/2018 Elsevier Patient Education  2020 [place], Adult Stress is a normal reaction to life events. Stress is what you feel when life demands more than you are used to, or more than you think you can handle. Some stress can be useful, such as  studying for a test or meeting a deadline at work. Stress that occurs too often or for too long can cause problems. It can affect your emotional health and interfere with relationships and normal daily activities. Too much stress can weaken your body's defense system (immune system) and increase your risk for physical illness. If you already have a medical problem, stress can make it worse. What are the causes? All sorts of life events can cause stress. An event that causes stress for one person may not be stressful for another person. Major life events, whether positive or negative, commonly cause stress. Examples include:  Losing a job or starting a new job.  Losing a loved one.  Moving to a new town or home.  Getting married or divorced.  Having a baby.  Getting injured or sick. Less obvious life events can also cause stress, especially if they occur day after day or in combination with each other. Examples include:  Working long hours.  Driving in traffic.  Caring for children.  Being in debt.  Being in a difficult relationship. What are the signs or symptoms? Stress can  cause emotional symptoms, including:  Anxiety. This is feeling worried, afraid, on edge, overwhelmed, or out of control.  Anger, including irritation or impatience.  Depression. This is feeling sad, down, helpless, or guilty.  Trouble focusing, remembering, or making decisions. Stress can cause physical symptoms, including:  Aches and pains. These may affect your head, neck, back, stomach, or other areas of your body.  Tight muscles or a clenched jaw.  Low energy.  Trouble sleeping. Stress can cause unhealthy behaviors, including:  Eating to feel better (overeating) or skipping meals.  Working too much or putting off tasks.  Smoking, drinking alcohol, or using drugs to feel better. How is this diagnosed? Stress is diagnosed through an assessment by your health care provider. He or she may  diagnose this condition based on:  Your symptoms and any stressful life events.  Your medical history.  Tests to rule out other causes of your symptoms. Depending on your condition, your health care provider may refer you to a specialist for further evaluation. How is this treated?  Stress management techniques are the recommended treatment for stress. Medicine is not typically recommended for the treatment of stress. Techniques to reduce your reaction to stressful life events include:  Stress identification. Monitor yourself for symptoms of stress and identify what causes stress for you. These skills may help you to avoid or prepare for stressful events.  Time management. Set your priorities, keep a calendar of events, and learn to say no. Taking these actions can help you avoid making too many commitments. Techniques for coping with stress include:  Rethinking the problem. Try to think realistically about stressful events rather than ignoring them or overreacting. Try to find the positives in a stressful situation rather than focusing on the negatives.  Exercise. Physical exercise can release both physical and emotional tension. The key is to find a form of exercise that you enjoy and do it regularly.  Relaxation techniques. These relax the body and mind. The key is to find one or more that you enjoy and use the techniques regularly. Examples include: ? Meditation, deep breathing, or progressive relaxation techniques. ? Yoga or tai chi. ? Biofeedback, mindfulness techniques, or journaling. ? Listening to music, being out in nature, or participating in other hobbies.  Practicing a healthy lifestyle. Eat a balanced diet, drink plenty of water, limit or avoid caffeine, and get plenty of sleep.  Having a strong support network. Spend time with family, friends, or other people you enjoy being around. Express your feelings and talk things over with someone you trust. Counseling or talk  therapy with a mental health professional may be helpful if you are having trouble managing stress on your own. Follow these instructions at home: Lifestyle   Avoid drugs.  Do not use any products that contain nicotine or tobacco, such as cigarettes, e-cigarettes, and chewing tobacco. If you need help quitting, ask your health care provider.  Limit alcohol intake to no more than 1 drink a day for nonpregnant women and 2 drinks a day for men. One drink equals 12 oz of beer, 5 oz of wine, or 1 oz of hard liquor  Do not use alcohol or drugs to relax.  Eat a balanced diet that includes fresh fruits and vegetables, whole grains, lean meats, fish, eggs, and beans, and low-fat dairy. Avoid processed foods and foods high in added fat, sugar, and salt.  Exercise at least 30 minutes on 5 or more days each week.  Get 7-8 hours of  sleep each night. General instructions   Practice stress management techniques as discussed with your health care provider.  Drink enough fluid to keep your urine clear or pale yellow.  Take over-the-counter and prescription medicines only as told by your health care provider.  Keep all follow-up visits as told by your health care provider. This is important. Contact a health care provider if:  Your symptoms get worse.  You have new symptoms.  You feel overwhelmed by your problems and can no longer manage them on your own. Get help right away if:  You have thoughts of hurting yourself or others. If you ever feel like you may hurt yourself or others, or have thoughts about taking your own life, get help right away. You can go to your nearest emergency department or call:  Your local emergency services (911 in the [place]  A suicide crisis helpline, such as the [place] at (215)453-1074. This is open 24 hours a day. Summary  Stress is a normal reaction to life events. It can cause problems if it happens too often or for too  long.  Practicing stress management techniques is the best way to treat stress.  Counseling or talk therapy with a mental health professional may be helpful if you are having trouble managing stress on your own. This information is not intended to replace advice given to you by your health care provider. Make sure you discuss any questions you have with your health care provider. Document Revised: 06/21/2019 Document Reviewed: 01/11/2017 Elsevier Patient Education  [employer].   If you have lab work done today you will be contacted with your lab results within the next 2 weeks.  If you have not heard from [place] then please contact us. The fastest way to get your results is to register for My Chart.   IF you received an x-ray today, you will receive an invoice from [facility]. Please contact [facility] at [phone] with questions or concerns regarding your invoice.   IF you received labwork today, you will receive an invoice from [place]. Please contact [facility] at [phone] with questions or concerns regarding your invoice.   Our billing staff will not be able to assist you with questions regarding bills from these companies.  You will be contacted with the lab results as soon as they are available. The fastest way to get your results is to activate your My Chart account. Instructions are located on the last page of this paperwork. If you have not heard from [place] regarding the results in 2 weeks, please contact this office.

## 2020-10-22 ENCOUNTER — Encounter: Payer: Self-pay | Admitting: Family Medicine

## 2020-10-22 LAB — THYROID ANTIBODIES
Thyroglobulin Antibody: <1 IU/mL (ref 0.0–0.9)
Thyroperoxidase Ab SerPl-aCnc: 11 IU/mL (ref 0–34)

## 2020-10-22 LAB — TSH+FREE T4
Free T4: 1.2 ng/dL (ref 0.82–1.77)
TSH: 0.363 u[IU]/mL — ABNORMAL LOW (ref 0.450–4.500)

## 2020-11-20 ENCOUNTER — Telehealth: Payer: Self-pay | Admitting: Family Medicine

## 2020-11-20 ENCOUNTER — Telehealth (INDEPENDENT_AMBULATORY_CARE_PROVIDER_SITE_OTHER): Payer: 59 | Admitting: Family Medicine

## 2020-11-20 ENCOUNTER — Other Ambulatory Visit: Payer: Self-pay

## 2020-11-20 DIAGNOSIS — R059 Cough, unspecified: Secondary | ICD-10-CM | POA: Diagnosis not present

## 2020-11-20 DIAGNOSIS — R0609 Other forms of dyspnea: Secondary | ICD-10-CM

## 2020-11-20 DIAGNOSIS — Z20822 Contact with and (suspected) exposure to covid-19: Secondary | ICD-10-CM | POA: Diagnosis not present

## 2020-11-20 DIAGNOSIS — R0600 Dyspnea, unspecified: Secondary | ICD-10-CM

## 2020-11-20 DIAGNOSIS — F439 Reaction to severe stress, unspecified: Secondary | ICD-10-CM

## 2020-11-20 NOTE — Progress Notes (Signed)
Virtual Visit via Telephone Note  I connected with Frances Conley on 11/20/20 at 3:29 PM by telephone and verified that I am speaking with the correct person using two identifiers. Patient location: outside office on bench. [employer]  My location: office   I discussed the limitations, risks, security and privacy concerns of performing an evaluation and management service by telephone and the availability of in person appointments. I also discussed with the patient that there may be a patient responsible charge related to this service. The patient expressed understanding and agreed to proceed, consent obtained  Chief complaint:  Chief Complaint  Patient presents with  . Follow-up    On medication and stress. Pt reports she has a lot on her mind, but isn't depressed.   Marland Kitchen URI     Patient has lower back pain, cough, seems disoriented. Symptoms since Wednesday. Grandson diagnosed with COVID last Friday. Pt lives with her grandson. Pt is requesting a work not for Thursday and today. If possible.     History of Present Illness: Frances Conley is a 59 y.o. female   Initial plan follow-up visit from her November 17 visit: Situational stressors discussed at that time, atypical fleeting chest pain symptoms without concerning findings on EKG.  Handout was given on stress as well as phone numbers provided for counseling. Potassium supplement 10 mEq daily for hypokalemia at November 17 visit Referred to endocrinology with prior history of thyroid nodules and possible hyperthyroidism with suspected proptosis. Low tsh at 0.363 on 11/17.  Started on vitamin D supplementation Still reports having a lot of on her mind, but denies depression. Has not called therapist. Just trying to work, financial stress. Trying not to get sick, but has to be around grandchildren. Walking to work - 15-42min. Denies acute needs or assistance form me at this time.  Getting out of breath easier past 6 months. Some trouble  walking hills. No chest pains. No recent worsening. Some swelling in legs.   Has had new cough - started few days ago, 11/16/20.  Tighter cough. Some runny nose. Dry cough. Shortness of breath about the same since prior to illness. R arm soreness yesterday, not today. Stomach sore from coughing, new low back pain since Monday as well. Urinating normally. Drinking fluids, but less than usual as feeling bad. More fatigued 2 days ago - in bed all day. Lightheaded with bending forward and standing up. Denies disorientation. Min shortness of breath with cough, talking for time, not at rest.   Does have sick contact with her 2yo grandson being diagnosed with Covid 19 - 1 week ago. Has been around him before illness and since illness.  She has been wearing mask around him. Last day at work was Tuesday 12/14.  Has not been vaccinated against COVID-19.   Depression screen [facility] 2/9 11/20/2020 10/21/2020 09/18/2020 08/14/2020 06/19/2020  Decreased Interest 0 0 0 0 0  Down, Depressed, Hopeless 0 0 0 0 0  PHQ - 2 Score 0 0 0 0 0       There are no problems to display for this patient.  Past Medical History:  Diagnosis Date  . Arthritis    both hips, hands, shoulders goes to Dr Gladstone Lighter, [facility]oll ortho md  . [id number](833.8)   . Hypercholesteremia   . Hypertension   . Ulcer    Past Surgical History:  Procedure Laterality Date  . [place]  . TUBAL LIGATION    .  UTERINE FIBROID SURGERY     No Known Allergies Prior to Admission medications   Medication Sig Start Date End Date Taking? Authorizing Provider  hydrochlorothiazide (HYDRODIURIL) 25 MG tablet Take 1 tablet (25 mg total) by mouth daily. 10/21/20  Yes Wendie Agreste, MD  HYDROcodone-acetaminophen (NORCO/VICODIN) 5-325 MG tablet Take one tablet at night for pain; may take up to every 6 hours as needed for pain if not working or driving 12/06/56  Yes [provider]  loratadine (CLARITIN) 10 MG tablet Take 1  tablet (10 mg total) by mouth daily. 10/21/20  Yes Wendie Agreste, MD  metroNIDAZOLE (FLAGYL) 500 MG tablet 1 pill by mouth twice per day.  Avoid any alcohol while taking this medicine. 08/14/20  Yes Wendie Agreste, MD  omeprazole (PRILOSEC) 20 MG capsule Take 1 capsule (20 mg total) by mouth daily. 10/21/20  Yes Wendie Agreste, MD  potassium chloride (KLOR-CON) 10 MEQ tablet Take 1 tablet (10 mEq total) by mouth daily. 10/21/20  Yes Wendie Agreste, MD  predniSONE (DELTASONE) 5 MG tablet Take 5 mg by mouth daily with breakfast.    Yes [provider]  simvastatin (ZOCOR) 40 MG tablet Take 1 tablet (40 mg total) by mouth daily. 09/10/20  Yes Posey Boyer, MD  Vitamin D, Ergocalciferol, (DRISDOL) 1.25 MG (50000 UNIT) CAPS capsule Take 1 capsule (50,000 Units total) by mouth every 7 (seven) days. 10/21/20  Yes Wendie Agreste, MD   Social History   Socioeconomic History  . Marital status: Legally Separated    Spouse name: Not on file  . Number of children: Not on file  . Years of education: Not on file  . Highest education level: Not on file  Occupational History  . Not on file  Tobacco Use  . Smoking status: Former Smoker    Packs/day: 0.50    Types: Cigarettes  . Smokeless tobacco: Never Used  Vaping Use  . Vaping Use: Never used  Substance and Sexual Activity  . Alcohol use: No  . Drug use: Never  . Sexual activity: Not on file  Other Topics Concern  . Not on file  Social History Narrative  . Not on file   Social Determinants of Health   Financial Resource Strain: Not on file  Food Insecurity: Not on file  Transportation Needs: Not on file  Physical Activity: Not on file  Stress: Not on file  Social Connections: Not on file  Intimate Partner Violence: Not on file     Observations/Objective: There were no vitals filed for this visit. Speaking in full sentences, no apparent respiratory distress. Appropriate responses, all questions  answered.   Assessment and Plan: Cough - Plan: Novel Coronavirus, NAA ([facility]) Exposure to COVID-19 virus - Plan: Novel Coronavirus, NAA ([facility]) DOE (dyspnea on exertion)  -Suspicious for COVID-19 given recent contact.  Longstanding dyspnea exertion, denies recent changes.  Covid testing performed, symptomatic care discussed for possible Covid as well as ER/911 precautions given with understanding expressed  Situational stress Situational stress discussed including treatment resources.  Denies depression at this time.  Follow Up Instructions: Patient Instructions    Based on your current symptoms I am suspicious for possible COVID-19 as you have had a close contact.  I will check the COVID-19 test today and those results should be available in the next few days.  Until those results are available I do want you to try to isolate yourself as much as possible from others and wear a  mask at all times when you are out of the home.  Make sure to drink plenty of fluids Tylenol is okay if needed for fever or body aches Mucinex or Mucinex DM if if needed for cough.   If you are unable to keep sufficient fluids down,  you feel like you are getting dehydrated, you are becoming confused or disoriented, you have any increasing shortness of breath including at rest, or any other acute worsening symptoms I recommend you be seen at the emergency room or urgent care.   Follow up in 2 weeks to discuss shortness of breath with exertion, and to review stressors to see if there are resources we may be able to help provide. Let me know if you have any acute needs prior to that time.   If you have lab work done today you will be contacted with your lab results within the next 2 weeks.  If you have not heard from [place] then please contact us. The fastest way to get your results is to register for My Chart.   IF you received an x-ray today, you will receive an invoice from [facility]. Please contact  [facility] at [phone] with questions or concerns regarding your invoice.   IF you received labwork today, you will receive an invoice from [place]. Please contact [facility] at [phone] with questions or concerns regarding your invoice.   Our billing staff will not be able to assist you with questions regarding bills from these companies.  You will be contacted with the lab results as soon as they are available. The fastest way to get your results is to activate your My Chart account. Instructions are located on the last page of this paperwork. If you have not heard from [place] regarding the results in 2 weeks, please contact this office.         I discussed the assessment and treatment plan with the patient. The patient was provided an opportunity to ask questions and all were answered. The patient agreed with the plan and demonstrated an understanding of the instructions.   The patient was advised to call back or seek an in-person evaluation if the symptoms worsen or if the condition fails to improve as anticipated.  I provided 21 minutes of non-face-to-face time during this encounter.  Signed,   Merri Ray, MD [facility] [place].  11/20/20

## 2020-11-20 NOTE — Patient Instructions (Addendum)
  Based on your current symptoms I am suspicious for possible COVID-19 as you have had a close contact.  I will check the COVID-19 test today and those results should be available in the next few days.  Until those results are available I do want you to try to isolate yourself as much as possible from others and wear a mask at all times when you are out of the home.  Make sure to drink plenty of fluids Tylenol is okay if needed for fever or body aches Mucinex or Mucinex DM if if needed for cough.   If you are unable to keep sufficient fluids down,  you feel like you are getting dehydrated, you are becoming confused or disoriented, you have any increasing shortness of breath including at rest, or any other acute worsening symptoms I recommend you be seen at the emergency room or urgent care.   Follow up in 2 weeks to discuss shortness of breath with exertion, and to review stressors to see if there are resources we may be able to help provide. Let me know if you have any acute needs prior to that time.   If you have lab work done today you will be contacted with your lab results within the next 2 weeks.  If you have not heard from [place] then please contact us. The fastest way to get your results is to register for My Chart.   IF you received an x-ray today, you will receive an invoice from [facility]. Please contact [facility] at [phone] with questions or concerns regarding your invoice.   IF you received labwork today, you will receive an invoice from [place]. Please contact [facility] at [phone] with questions or concerns regarding your invoice.   Our billing staff will not be able to assist you with questions regarding bills from these companies.  You will be contacted with the lab results as soon as they are available. The fastest way to get your results is to activate your My Chart account. Instructions are located on the last page of this paperwork. If you have not heard  from [place] regarding the results in 2 weeks, please contact this office.

## 2020-11-20 NOTE — Telephone Encounter (Signed)
11/20/2020 - PATIENT HAD A TELEMED VISIT WITH DR. Carlota Raspberry ON Friday 11/20/2020. DR. Carlota Raspberry HAS REQUESTED SHE RECHECK IN 2 WEEKS ON HER DOE AND SITUATIONAL STRESS. I TRIED TO CALL AND SCHEDULE BUT HAD TO LEAVE A MESSAGE ON HER VOICE MAIL TO RETURN MY CALL. DR. Carlota Raspberry SAID SHE COULD SEE ANOTHER PROVIDER DUE TO HIS SCHEDULE BEING FULL. [place]

## 2020-11-21 LAB — SARS-COV-2, NAA 2 DAY TAT

## 2020-11-21 LAB — NOVEL CORONAVIRUS, NAA: SARS-CoV-2, NAA: DETECTED — AB

## 2020-11-22 ENCOUNTER — Telehealth: Payer: Self-pay | Admitting: Nurse Practitioner

## 2020-11-22 NOTE — Telephone Encounter (Signed)
Called to Discuss with patient about Covid symptoms and the use of the monoclonal antibody infusion for those with mild to moderate Covid symptoms and at a high risk of hospitalization.     Pt appears to qualify for this infusion due to co-morbid conditions and/or a member of an at-risk group in accordance with the FDA Emergency Use Authorization. (smoker/hypertension).  Patient declines infusion at this time. Symptoms discussed and patient has information to call if she decides she wished to receive. Symptom onset 12/14.   Alda Lea, NP [facility]  [phone]

## 2020-12-01 ENCOUNTER — Other Ambulatory Visit: Payer: Self-pay

## 2020-12-01 ENCOUNTER — Ambulatory Visit: Payer: 59

## 2020-12-01 ENCOUNTER — Ambulatory Visit: Payer: 59 | Admitting: Registered Nurse

## 2020-12-01 ENCOUNTER — Telehealth: Payer: Self-pay | Admitting: Family Medicine

## 2020-12-01 DIAGNOSIS — R7989 Other specified abnormal findings of blood chemistry: Secondary | ICD-10-CM

## 2020-12-01 DIAGNOSIS — R0609 Other forms of dyspnea: Secondary | ICD-10-CM

## 2020-12-01 NOTE — Patient Instructions (Signed)
° ° ° °  If you have lab work done today you will be contacted with your lab results within the next 2 weeks.  If you have not heard from us then please contact us. The fastest way to get your results is to register for My Chart. ° ° °IF you received an x-ray today, you will receive an invoice from [facility]. Please contact [facility] at [phone] with questions or concerns regarding your invoice.  ° °IF you received labwork today, you will receive an invoice from [facility]. Please contact [facility] at [phone] with questions or concerns regarding your invoice.  ° °Our billing staff will not be able to assist you with questions regarding bills from these companies. ° °You will be contacted with the lab results as soon as they are available. The fastest way to get your results is to activate your My Chart account. Instructions are located on the last page of this paperwork. If you have not heard from us regarding the results in 2 weeks, please contact this office. °  ° ° ° °

## 2020-12-01 NOTE — Progress Notes (Signed)
Pt was 15 min late for her appt with R Morrow. Had to reschedule we did labs while she was here fasting. Also provided a return to work for Thursday. Pt has rescheduled and will follow up as appropriate

## 2020-12-01 NOTE — Telephone Encounter (Signed)
Labs obtained without OV today - rescheduled for tomorrow. . It appears TSH and thyroglobulin ordered but these were recently ordered with plan for endocrinology follow-up. Plan for repeat visit today was to discuss dyspnea on exertion in part. If we can add CBC and proBNP with blood work today, please add that. Can cancel thyroglobulin, TSH, unless it has already been run.

## 2020-12-02 ENCOUNTER — Encounter: Payer: Self-pay | Admitting: Registered Nurse

## 2020-12-02 ENCOUNTER — Telehealth (INDEPENDENT_AMBULATORY_CARE_PROVIDER_SITE_OTHER): Payer: 59 | Admitting: Registered Nurse

## 2020-12-02 ENCOUNTER — Other Ambulatory Visit: Payer: Self-pay

## 2020-12-02 DIAGNOSIS — U071 COVID-19: Secondary | ICD-10-CM

## 2020-12-02 DIAGNOSIS — E782 Mixed hyperlipidemia: Secondary | ICD-10-CM

## 2020-12-02 LAB — TSH+FREE T4
Free T4: 1.22 ng/dL (ref 0.82–1.77)
TSH: 0.47 u[IU]/mL (ref 0.450–4.500)

## 2020-12-02 LAB — THYROGLOBULIN ANTIBODY: Thyroglobulin Antibody: <1 IU/mL (ref 0.0–0.9)

## 2020-12-02 MED ORDER — SIMVASTATIN 40 MG PO TABS: 40.0000 mg | ORAL_TABLET | Freq: Every day | ORAL | 3 refills | Status: DC

## 2020-12-02 NOTE — Patient Instructions (Signed)
° ° ° °  If you have lab work done today you will be contacted with your lab results within the next 2 weeks.  If you have not heard from us then please contact us. The fastest way to get your results is to register for My Chart. ° ° °IF you received an x-ray today, you will receive an invoice from [facility]. Please contact [facility] at [phone] with questions or concerns regarding your invoice.  ° °IF you received labwork today, you will receive an invoice from [facility]. Please contact [facility] at [phone] with questions or concerns regarding your invoice.  ° °Our billing staff will not be able to assist you with questions regarding bills from these companies. ° °You will be contacted with the lab results as soon as they are available. The fastest way to get your results is to activate your My Chart account. Instructions are located on the last page of this paperwork. If you have not heard from us regarding the results in 2 weeks, please contact this office. °  ° ° ° °

## 2020-12-15 ENCOUNTER — Ambulatory Visit (INDEPENDENT_AMBULATORY_CARE_PROVIDER_SITE_OTHER): Payer: Self-pay | Admitting: Endocrinology

## 2020-12-15 ENCOUNTER — Other Ambulatory Visit: Payer: Self-pay

## 2020-12-15 ENCOUNTER — Ambulatory Visit: Payer: Self-pay | Admitting: Endocrinology

## 2020-12-15 ENCOUNTER — Encounter: Payer: Self-pay | Admitting: Endocrinology

## 2020-12-15 DIAGNOSIS — E0590 Thyrotoxicosis, unspecified without thyrotoxic crisis or storm: Secondary | ICD-10-CM

## 2020-12-15 NOTE — Patient Instructions (Addendum)
Your overactive thyroid is mild.  However, with time, it will probably become more overactive.  Therefore, Dr Carlota Raspberry should check this 1-2 times per year.   If the TSH goes below 0.2, please come back here.

## 2020-12-15 NOTE — Progress Notes (Signed)
Subjective:    Patient ID: Frances Conley, female    DOB: 1961/09/16, 60 y.o.   MRN: [id number]  HPI Pt is referred by Dr Carlota Raspberry, for hyperthyroidism.  Pt reports he was dx'ed with hyperthyroidism in 2017.  she has never been on therapy for this.  she has never had XRT to the anterior neck, or thyroid surgery.  she has never had thyroid imaging.  she does not consume kelp or any other non-prescribed thyroid medication.  she has never been on amiodarone.  Pt says she had a bx of the ant neck (? Thyroid).   She reports chronic weight gain, doe, anxiety, and intermitt palpitations.   Past Medical History:  Diagnosis Date  . Arthritis    both hips, hands, shoulders goes to Dr Gladstone Lighter, [facility] ortho md  . NWGNFAOZ(308.6)   . Hypercholesteremia   . Hypertension   . Ulcer     Past Surgical History:  Procedure Laterality Date  . [place]  . TUBAL LIGATION    . UTERINE FIBROID SURGERY      Social History   Socioeconomic History  . Marital status: Legally Separated    Spouse name: Not on file  . Number of children: Not on file  . Years of education: Not on file  . Highest education level: Not on file  Occupational History  . Not on file  Tobacco Use  . Smoking status: Former Smoker    Packs/day: 0.50    Types: Cigarettes  . Smokeless tobacco: Never Used  Vaping Use  . Vaping Use: Never used  Substance and Sexual Activity  . Alcohol use: No  . Drug use: Never  . Sexual activity: Not on file  Other Topics Concern  . Not on file  Social History Narrative  . Not on file   Social Determinants of Health   Financial Resource Strain: Not on file  Food Insecurity: Not on file  Transportation Needs: Not on file  Physical Activity: Not on file  Stress: Not on file  Social Connections: Not on file  Intimate Partner Violence: Not on file    Current Outpatient Medications on File Prior to Visit  Medication Sig Dispense Refill  . hydrochlorothiazide  (HYDRODIURIL) 25 MG tablet Take 1 tablet (25 mg total) by mouth daily. 30 tablet 3  . HYDROcodone-acetaminophen (NORCO/VICODIN) 5-325 MG tablet Take one tablet at night for pain; may take up to every 6 hours as needed for pain if not working or driving    . loratadine (CLARITIN) 10 MG tablet Take 1 tablet (10 mg total) by mouth daily. 90 tablet 1  . metroNIDAZOLE (FLAGYL) 500 MG tablet 1 pill by mouth twice per day.  Avoid any alcohol while taking this medicine. 14 tablet 0  . omeprazole (PRILOSEC) 20 MG capsule Take 1 capsule (20 mg total) by mouth daily. 30 capsule 3  . potassium chloride (KLOR-CON) 10 MEQ tablet Take 1 tablet (10 mEq total) by mouth daily. 90 tablet 1  . predniSONE (DELTASONE) 5 MG tablet Take 5 mg by mouth daily with breakfast.     . simvastatin (ZOCOR) 40 MG tablet Take 1 tablet (40 mg total) by mouth daily. 90 tablet 3  . Vitamin D, Ergocalciferol, (DRISDOL) 1.25 MG (50000 UNIT) CAPS capsule Take 1 capsule (50,000 Units total) by mouth every 7 (seven) days. 15 capsule 0   No current facility-administered medications on file prior to visit.    No Known  Allergies  Family History  Problem Relation Age of Onset  . Heart failure Mother   . Cancer Mother   . Diabetes Mother   . Hyperlipidemia Mother   . Hypertension Mother   . Thyroid disease Mother   . Heart failure Father   . Chronic Renal Failure Father   . Diabetes Father   . Kidney disease Father   . Hyperlipidemia Father   . Hypertension Father   . Heart failure Sister   . Diabetes Sister   . Heart failure Brother     BP 138/82   Pulse 62   Ht 5\' 3"  (1.6 m)   Wt 180 lb (81.6 kg)   SpO2 99%   BMI 31.89 kg/m     Review of Systems denies excessive diaphoresis, tremor, and heat intolerance.       Objective:   Physical Exam VS: see vs page GEN: no distress HEAD: head: no deformity eyes: no periorbital swelling, but there is slight bilat proptosis.  external nose and ears are normal NECK:  supple, thyroid is not enlarged CHEST WALL: no deformity LUNGS: clear to auscultation CV: reg rate and rhythm, no murmur.  MUSCULOSKELETAL: gait is normal and steady EXTEMITIES: no deformity.  Trace bilat leg edema NEURO:  readily moves all 4's.  sensation is intact to touch on all 4's.  No tremor SKIN:  Normal texture and temperature.  No rash or suspicious lesion is visible.  Not diaphoretic.   NODES:  None palpable at the neck.   PSYCH: alert, well-oriented.  Does not appear anxious nor depressed.     Lab Results  Component Value Date   TSH 0.470 12/01/2020       Assessment & Plan:  Hyperthyroidism, new to me, mild: due to Grave's Dz or small MNG.    Patient Instructions  Your overactive thyroid is mild.  However, with time, it will probably become more overactive.  Therefore, Dr Carlota Raspberry should check this 1-2 times per year.   If the TSH goes below 0.2, please come back here.

## 2020-12-19 DIAGNOSIS — E0590 Thyrotoxicosis, unspecified without thyrotoxic crisis or storm: Secondary | ICD-10-CM | POA: Insufficient documentation

## 2021-03-30 ENCOUNTER — Other Ambulatory Visit: Payer: Self-pay | Admitting: Family Medicine

## 2021-03-30 DIAGNOSIS — I10 Essential (primary) hypertension: Secondary | ICD-10-CM

## 2021-03-30 DIAGNOSIS — R7989 Other specified abnormal findings of blood chemistry: Secondary | ICD-10-CM

## 2021-04-02 MED ORDER — VITAMIN D (ERGOCALCIFEROL) 1.25 MG (50000 UNIT) PO CAPS: 50000.0000 [IU] | ORAL_CAPSULE | ORAL | 0 refills | Status: DC

## 2021-04-02 NOTE — Telephone Encounter (Signed)
Hi, Frances Conley this is Dr Vonna Kotyk patient requesting refill. Thanks

## 2021-04-19 NOTE — Progress Notes (Signed)
Telemedicine Encounter- SOAP NOTE Established Patient  This telephone encounter was conducted with the patient's (or proxy's) verbal consent via audio telecommunications: yes  Patient was instructed to have this encounter in a suitably private space; and to only have persons present to whom they give permission to participate. In addition, patient identity was confirmed by use of name plus two identifiers (DOB and address).  I discussed the limitations, risks, security and privacy concerns of performing an evaluation and management service by telephone and the availability of in person appointments. I also discussed with the patient that there may be a patient responsible charge related to this service. The patient expressed understanding and agreed to proceed.  I spent a total of 14 minutes talking with the patient or their proxy.  Patient at home Provider in office  Participants: Kathrin Ruddy, NP and Lianne Moris  Chief Complaint  Patient presents with  . Follow-up    Patient states she needs a follow up from Covid.    Subjective   Frances Conley is a 60 y.o. established patient. Telephone visit today for covid follow up   HPI Much improved. Resolution of essentially all symptoms. Mild lingering fatigue and cough  No worsening symptoms Wants to ensure she is okay to return to work.  Otherwise, notes she is running low on simvastatin. Tolerates well without AEs. Hopes to continue. Plans to have appt with PCP for follow up on this in the coming months.  Patient Active Problem List   Diagnosis Date Noted  . Hyperthyroidism 12/19/2020    Past Medical History:  Diagnosis Date  . Arthritis    both hips, hands, shoulders goes to Dr Gladstone Lighter, [facility] ortho md  . JJHERDEY(814.4)   . Hypercholesteremia   . Hypertension   . Ulcer     Current Outpatient Medications  Medication Sig Dispense Refill  . HYDROcodone-acetaminophen (NORCO/VICODIN) 5-325 MG tablet Take one tablet at night  for pain; may take up to every 6 hours as needed for pain if not working or driving    . loratadine (CLARITIN) 10 MG tablet Take 1 tablet (10 mg total) by mouth daily. 90 tablet 1  . metroNIDAZOLE (FLAGYL) 500 MG tablet 1 pill by mouth twice per day.  Avoid any alcohol while taking this medicine. 14 tablet 0  . omeprazole (PRILOSEC) 20 MG capsule Take 1 capsule (20 mg total) by mouth daily. 30 capsule 3  . potassium chloride (KLOR-CON) 10 MEQ tablet Take 1 tablet (10 mEq total) by mouth daily. 90 tablet 1  . predniSONE (DELTASONE) 5 MG tablet Take 5 mg by mouth daily with breakfast.     . hydrochlorothiazide (HYDRODIURIL) 25 MG tablet Take 1 tablet by mouth once daily 30 tablet 0  . simvastatin (ZOCOR) 40 MG tablet Take 1 tablet (40 mg total) by mouth daily. 90 tablet 3  . Vitamin D, Ergocalciferol, (DRISDOL) 1.25 MG (50000 UNIT) CAPS capsule Take 1 capsule (50,000 Units total) by mouth every 7 (seven) days. 15 capsule 0   No current facility-administered medications for this visit.    No Known Allergies  Social History   Socioeconomic History  . Marital status: Legally Separated    Spouse name: Not on file  . Number of children: Not on file  . Years of education: Not on file  . Highest education level: Not on file  Occupational History  . Not on file  Tobacco Use  . Smoking status: Former Smoker    Packs/day: 0.50  Types: Cigarettes  . Smokeless tobacco: Never Used  Vaping Use  . Vaping Use: Never used  Substance and Sexual Activity  . Alcohol use: No  . Drug use: Never  . Sexual activity: Not on file  Other Topics Concern  . Not on file  Social History Narrative  . Not on file   Social Determinants of Health   Financial Resource Strain: Not on file  Food Insecurity: Not on file  Transportation Needs: Not on file  Physical Activity: Not on file  Stress: Not on file  Social Connections: Not on file  Intimate Partner Violence: Not on file    ROS Per hpi    Objective   Vitals as reported by the patient: There were no vitals filed for this visit.  Sandeep was seen today for follow-up.  Diagnoses and all orders for this visit:  COVID-19  Mixed hyperlipidemia -     simvastatin (ZOCOR) 40 MG tablet; Take 1 tablet (40 mg total) by mouth daily.   PLAN  Okay to resume regular activities  Return if symptoms persist or worsen  Refill simvastatin  Patient encouraged to call clinic with any questions, comments, or concerns.   I discussed the assessment and treatment plan with the patient. The patient was provided an opportunity to ask questions and all were answered. The patient agreed with the plan and demonstrated an understanding of the instructions.   The patient was advised to call back or seek an in-person evaluation if the symptoms worsen or if the condition fails to improve as anticipated.  I provided 14 minutes of non-face-to-face time during this encounter.  Maximiano Coss, NP  Primary Care at [facility]

## 2021-04-28 ENCOUNTER — Other Ambulatory Visit: Payer: Self-pay | Admitting: Family Medicine

## 2021-04-28 DIAGNOSIS — I10 Essential (primary) hypertension: Secondary | ICD-10-CM

## 2021-04-29 ENCOUNTER — Other Ambulatory Visit: Payer: Self-pay

## 2021-04-29 ENCOUNTER — Other Ambulatory Visit: Payer: Self-pay | Admitting: Family Medicine

## 2021-04-29 DIAGNOSIS — I10 Essential (primary) hypertension: Secondary | ICD-10-CM

## 2021-04-30 ENCOUNTER — Telehealth (INDEPENDENT_AMBULATORY_CARE_PROVIDER_SITE_OTHER): Payer: 59 | Admitting: Family Medicine

## 2021-04-30 ENCOUNTER — Encounter: Payer: Self-pay | Admitting: Family Medicine

## 2021-04-30 DIAGNOSIS — I10 Essential (primary) hypertension: Secondary | ICD-10-CM | POA: Diagnosis not present

## 2021-04-30 DIAGNOSIS — Z87898 Personal history of other specified conditions: Secondary | ICD-10-CM

## 2021-04-30 DIAGNOSIS — E0590 Thyrotoxicosis, unspecified without thyrotoxic crisis or storm: Secondary | ICD-10-CM

## 2021-04-30 DIAGNOSIS — E782 Mixed hyperlipidemia: Secondary | ICD-10-CM

## 2021-04-30 MED ORDER — HYDROCHLOROTHIAZIDE 25 MG PO TABS
1.0000 | ORAL_TABLET | Freq: Every day | ORAL | 2 refills | Status: DC
Start: 2021-04-30 — End: 2022-01-27

## 2021-04-30 MED ORDER — OMEPRAZOLE 20 MG PO CPDR: 20.0000 mg | DELAYED_RELEASE_CAPSULE | Freq: Every day | ORAL | 3 refills | Status: DC

## 2021-04-30 MED ORDER — SIMVASTATIN 40 MG PO TABS: 40.0000 mg | ORAL_TABLET | Freq: Every day | ORAL | 3 refills | Status: DC

## 2021-04-30 NOTE — Progress Notes (Signed)
Virtual Visit via Video Note  I connected with Frances Conley on 04/30/21 at 10:17 AM by a video enabled telemedicine application and verified that I am speaking with the correct person using two identifiers.  Patient location: home  My location: office - [place].    I discussed the limitations, risks, security and privacy concerns of performing an evaluation and management service by telephone and the availability of in person appointments. I also discussed with the patient that there may be a patient responsible charge related to this service. The patient expressed understanding and agreed to proceed, consent obtained  Chief complaint:  Chief Complaint  Patient presents with  . Hypertension    Pt reports having no physical symptoms, needs refill HCTZ    . Hyperlipidemia    Pt in need of refills today.      History of Present Illness: Frances Conley is a 60 y.o. female   Hypertension: hctz 25mg  qd. Home readings: normal at urgent care, not checking at home.  BP Readings from Last 3 Encounters:  12/15/20 138/82  10/21/20 136/88  09/18/20 128/90   Lab Results  Component Value Date   CREATININE 0.84 09/18/2020   Constitutional: Negative for fatigue and unexpected weight change.  Eyes: Negative for visual disturbance.  Respiratory: Negative for cough, chest tightness and shortness of breath.   Cardiovascular: Negative for chest pain, palpitations and leg swelling.  Gastrointestinal: Negative for abdominal pain and blood in stool.  Neurological: Negative for dizziness, light-headedness and headaches.    Hyperthyroidism: Lab Results  Component Value Date   TSH 0.470 12/01/2020   No new hot or cold intolerance. No new hair or skin changes, heart palpitations or new fatigue. No new weight changes.  Evaluated by Dr. Loanne Drilling in January. Mild hyperthyroid due to small multinodular goiter or Graves disease.  No new meds, recommended monitoring TSH 1-2 times per year, and follow  up if new symptoms or TSH below 0.2.   GERD with PUD in past.  prilosec 20mg  qd as needed. Had been off of it. Restarted recently. Working well.no abd pain, no blood in stool. Prior soreness in stomach better. Drinking more water.    Hyperlipidemia: Simvastatin 40mg  qd. No new myalgias.  Lab Results  Component Value Date   CHOL 186 09/18/2020   HDL 35 (L) 09/18/2020   LDLCALC 131 (H) 09/18/2020   TRIG 108 09/18/2020   CHOLHDL 5.3 (H) 09/18/2020   Lab Results  Component Value Date   ALT 17 09/18/2020   AST 17 09/18/2020   ALKPHOS 100 09/18/2020   BILITOT 0.6 09/18/2020      Patient Active Problem List   Diagnosis Date Noted  . Hyperthyroidism 12/19/2020   Past Medical History:  Diagnosis Date  . Arthritis    both hips, hands, shoulders goes to Dr Gladstone Lighter, [facility] ortho md  . FRTMYTRZ(735.6)   . Hypercholesteremia   . Hypertension   . Ulcer    Past Surgical History:  Procedure Laterality Date  . [place]  . TUBAL LIGATION    . UTERINE FIBROID SURGERY     No Known Allergies Prior to Admission medications   Medication Sig Start Date End Date Taking? Authorizing Provider  hydrochlorothiazide (HYDRODIURIL) 25 MG tablet Take 1 tablet by mouth once daily 04/29/21  Yes Wendie Agreste, MD  HYDROcodone-acetaminophen (NORCO/VICODIN) 5-325 MG tablet Take one tablet at night for pain; may take up to every 6 hours as needed for  pain if not working or driving 2/83/15  Yes [provider]  loratadine (CLARITIN) 10 MG tablet Take 1 tablet (10 mg total) by mouth daily. 10/21/20  Yes Wendie Agreste, MD  metroNIDAZOLE (FLAGYL) 500 MG tablet 1 pill by mouth twice per day.  Avoid any alcohol while taking this medicine. 08/14/20  Yes Wendie Agreste, MD  omeprazole (PRILOSEC) 20 MG capsule Take 1 capsule (20 mg total) by mouth daily. 10/21/20  Yes Wendie Agreste, MD  potassium chloride (KLOR-CON) 10 MEQ tablet Take 1 tablet (10 mEq total) by  mouth daily. 10/21/20  Yes Wendie Agreste, MD  predniSONE (DELTASONE) 5 MG tablet Take 5 mg by mouth daily with breakfast.    Yes [provider]  simvastatin (ZOCOR) 40 MG tablet Take 1 tablet (40 mg total) by mouth daily. 12/02/20  Yes Maximiano Coss, NP  Vitamin D, Ergocalciferol, (DRISDOL) 1.25 MG (50000 UNIT) CAPS capsule Take 1 capsule (50,000 Units total) by mouth every 7 (seven) days. 04/02/21  Yes Wendie Agreste, MD   Social History   Socioeconomic History  . Marital status: Legally Separated    Spouse name: Not on file  . Number of children: Not on file  . Years of education: Not on file  . Highest education level: Not on file  Occupational History  . Not on file  Tobacco Use  . Smoking status: Former Smoker    Packs/day: 0.50    Types: Cigarettes  . Smokeless tobacco: Never Used  Vaping Use  . Vaping Use: Never used  Substance and Sexual Activity  . Alcohol use: No  . Drug use: Never  . Sexual activity: Not on file  Other Topics Concern  . Not on file  Social History Narrative  . Not on file   Social Determinants of Health   Financial Resource Strain: Not on file  Food Insecurity: Not on file  Transportation Needs: Not on file  Physical Activity: Not on file  Stress: Not on file  Social Connections: Not on file  Intimate Partner Violence: Not on file   Review of systems Per HPI Observations/Objective: There were no vitals filed for this visit. Nontoxic appearance on video, speaking full sentences, euthymic mood, no respiratory distress.  All questions answered with understanding of plan expressed.  Assessment and Plan: Hyperthyroidism - Plan: TSH  Essential hypertension - Plan: hydrochlorothiazide (HYDRODIURIL) 25 MG tablet, Comprehensive metabolic panel  Mixed hyperlipidemia - Plan: simvastatin (ZOCOR) 40 MG tablet, Lipid panel  History of ulcer disease - Plan: omeprazole (PRILOSEC) 20 MG capsule  Tolerating current med regimen as  above for hypertension, hyperlipidemia and stable GERD/abdominal symptoms back on PPI.  Continue same.  Plan for lab only visit with TSH every 6 months to monitor for worsening hyperthyroidism versus hypothyroidism as above.  Asymptomatic at this time.  Lab only visit next week, then 39-month follow-up.  Meds refilled.   Follow Up Instructions: Lab visit next week. 6 months in person.    I discussed the assessment and treatment plan with the patient. The patient was provided an opportunity to ask questions and all were answered. The patient agreed with the plan and demonstrated an understanding of the instructions.   The patient was advised to call back or seek an in-person evaluation if the symptoms worsen or if the condition fails to improve as anticipated.  I provided 15 minutes of non-face-to-face time during this encounter.   Wendie Agreste, MD

## 2021-05-01 ENCOUNTER — Other Ambulatory Visit: Payer: Self-pay

## 2021-05-01 ENCOUNTER — Emergency Department (HOSPITAL_COMMUNITY)
Admission: EM | Admit: 2021-05-01 | Discharge: 2021-05-01 | Disposition: A | Discharge status: Home / Self Care | Payer: 59 | Attending: Emergency Medicine | Admitting: Emergency Medicine

## 2021-05-01 DIAGNOSIS — Z28310 Unvaccinated for covid-19: Secondary | ICD-10-CM | POA: Diagnosis not present

## 2021-05-01 DIAGNOSIS — R519 Headache, unspecified: Secondary | ICD-10-CM | POA: Diagnosis present

## 2021-05-01 DIAGNOSIS — Z79899 Other long term (current) drug therapy: Secondary | ICD-10-CM | POA: Diagnosis not present

## 2021-05-01 DIAGNOSIS — U071 COVID-19: Secondary | ICD-10-CM | POA: Diagnosis not present

## 2021-05-01 DIAGNOSIS — I10 Essential (primary) hypertension: Secondary | ICD-10-CM | POA: Diagnosis not present

## 2021-05-01 DIAGNOSIS — Z87891 Personal history of nicotine dependence: Secondary | ICD-10-CM | POA: Insufficient documentation

## 2021-05-01 LAB — CBC WITH DIFFERENTIAL/PLATELET
Abs Immature Granulocytes: 0.01 10*3/uL (ref 0.00–0.07)
Basophils Absolute: 0 10*3/uL (ref 0.0–0.1)
Basophils Relative: 1 %
Eosinophils Absolute: 0.1 10*3/uL (ref 0.0–0.5)
Eosinophils Relative: 2 %
HCT: 42.1 % (ref 36.0–46.0)
Hemoglobin: 13.6 g/dL (ref 12.0–15.0)
Immature Granulocytes: 0 %
Lymphocytes Relative: 22 %
Lymphs Abs: 1.3 10*3/uL (ref 0.7–4.0)
MCH: 27.2 pg (ref 26.0–34.0)
MCHC: 32.3 g/dL (ref 30.0–36.0)
MCV: 84.2 fL (ref 80.0–100.0)
Monocytes Absolute: 1 10*3/uL (ref 0.1–1.0)
Monocytes Relative: 16 %
Neutro Abs: 3.6 10*3/uL (ref 1.7–7.7)
Neutrophils Relative %: 59 %
Platelets: 187 10*3/uL (ref 150–400)
RBC: 5 MIL/uL (ref 3.87–5.11)
RDW: 13.5 % (ref 11.5–15.5)
WBC: 6 10*3/uL (ref 4.0–10.5)
nRBC: 0 % (ref 0.0–0.2)

## 2021-05-01 LAB — RESP PANEL BY RT-PCR (FLU A&B, COVID) ARPGX2
Influenza A by PCR: NEGATIVE
Influenza B by PCR: NEGATIVE
SARS Coronavirus 2 by RT PCR: POSITIVE — AB

## 2021-05-01 LAB — COMPREHENSIVE METABOLIC PANEL
ALT: 25 U/L (ref 0–44)
AST: 27 U/L (ref 15–41)
Albumin: 4 g/dL (ref 3.5–5.0)
Alkaline Phosphatase: 84 U/L (ref 38–126)
Anion gap: 11 (ref 5–15)
BUN: 11 mg/dL (ref 6–20)
CO2: 27 mmol/L (ref 22–32)
Calcium: 9.1 mg/dL (ref 8.9–10.3)
Chloride: 103 mmol/L (ref 98–111)
Creatinine, Ser: 0.92 mg/dL (ref 0.44–1.00)
GFR, Estimated: >60 mL/min (ref >60)
Glucose, Bld: 98 mg/dL (ref 70–99)
Potassium: 3.3 mmol/L — ABNORMAL LOW (ref 3.5–5.1)
Sodium: 141 mmol/L (ref 135–145)
Total Bilirubin: 0.3 mg/dL (ref 0.3–1.2)
Total Protein: 7.7 g/dL (ref 6.5–8.1)

## 2021-05-01 MED ORDER — ACETAMINOPHEN 500 MG PO TABS
1000.0000 mg | ORAL_TABLET | Freq: Once | ORAL | Status: AC
  Administered 2021-05-01: 1000 mg via ORAL
  Filled 2021-05-01: qty 2

## 2021-05-01 NOTE — ED Provider Notes (Signed)
Emergency Medicine Provider Triage Evaluation Note  Frances Conley , a 60 y.o. female  was evaluated in triage.  Pt complains of headache.  Patient states this began yesterday.  She has a frontal headache with lightheadedness.  She reports associated nasal congestion and mild cough.  She reports her daughter is also sick with a "head cold."  Her daughter has not been tested for COVID, patient is not vaccinated for COVID. She has not taken any medications for her HA.   Review of Systems  Positive: HA, nasal congestion, cough, lightheadedness Negative: fever  Physical Exam  BP (!) 146/93 (BP Location: Left Arm)   Pulse 81   Temp 98.9 F (37.2 C) (Oral)   Resp 18   Ht 5\' 3"  (1.6 m)   Wt 83 kg   SpO2 99%   BMI 32.42 kg/m  Gen:   Awake, no distress   Resp:  Normal effort  MSK:   Moves extremities without difficulty  Other:  Alert and oriented.  Speaking full sentences.  Nasal mucosal edema noted.  Medical Decision Making  Medically screening exam initiated at 12:10 PM.  Appropriate orders placed.  Ashling Roane Orlowski was informed that the remainder of the evaluation will be completed by another provider, this initial triage assessment does not replace that evaluation, and the importance of remaining in the ED until their evaluation is complete.  HA with URI-like sxs. Consider covid vs viral vs migraine.    Franchot Heidelberg, PA-C 05/01/21 1225    Sherwood Gambler, MD 05/01/21 [phone]

## 2021-05-01 NOTE — Discharge Instructions (Addendum)
Take Tylenol as needed for headache.  Contact your doctor if you change your mind about any covid treatments that we discussed today or you can call back tomorrow.  I will be working in the ED from 1pm to 11pm.  Stay quarantine at home for 7 days. Return to the ED for difficulty breathing

## 2021-05-01 NOTE — ED Triage Notes (Signed)
Patient reports she has bad headache and feels lightheaded x 2 days. Pain rated 10/10 . Denies any other symptoms.

## 2021-05-01 NOTE — ED Provider Notes (Signed)
[facility] DEPT Provider Note   CSN: [id number] Arrival date & time: 05/01/21  1100     History Chief Complaint  Patient presents with  . Headache    Frances Conley is a 60 y.o. female.  HPI   Patient presented to the ED for evaluation of a headache that started yesterday.  Patient states it was in the frontal area.  It lasted throughout the entire night.  Strong headache.  She is also had some nasal congestion and mild cough.  Her daughter is also having some URI type symptoms.  Patient is not vaccinated for COVID.  She does not like taking medicine so she did not take anything for her headache.  She denies any fevers.  No shortness of breath.  No vomiting or diarrhea  Past Medical History:  Diagnosis Date  . Arthritis    both hips, hands, shoulders goes to Dr Gladstone Lighter, Jori Moll ortho md  . [id number](595.6)   . Hypercholesteremia   . Hypertension   . Ulcer     Patient Active Problem List   Diagnosis Date Noted  . Hyperthyroidism 12/19/2020    Past Surgical History:  Procedure Laterality Date  . [place]  . TUBAL LIGATION    . UTERINE FIBROID SURGERY       OB History   No obstetric history on file.     Family History  Problem Relation Age of Onset  . Heart failure Mother   . Cancer Mother   . Diabetes Mother   . Hyperlipidemia Mother   . Hypertension Mother   . Thyroid disease Mother   . Heart failure Father   . Chronic Renal Failure Father   . Diabetes Father   . Kidney disease Father   . Hyperlipidemia Father   . Hypertension Father   . Heart failure Sister   . Diabetes Sister   . Heart failure Brother     Social History   Tobacco Use  . Smoking status: Former Smoker    Packs/day: 0.50    Types: Cigarettes  . Smokeless tobacco: Never Used  Vaping Use  . Vaping Use: Never used  Substance Use Topics  . Alcohol use: No  . Drug use: Never    Home Medications Prior to Admission  medications   Medication Sig Start Date End Date Taking? Authorizing Provider  hydrochlorothiazide (HYDRODIURIL) 25 MG tablet Take 1 tablet (25 mg total) by mouth daily. 04/30/21   Wendie Agreste, MD  HYDROcodone-acetaminophen (NORCO/VICODIN) 5-325 MG tablet Take one tablet at night for pain; may take up to every 6 hours as needed for pain if not working or driving 3/87/56   [provider]  loratadine (CLARITIN) 10 MG tablet Take 1 tablet (10 mg total) by mouth daily. 10/21/20   Wendie Agreste, MD  metroNIDAZOLE (FLAGYL) 500 MG tablet 1 pill by mouth twice per day.  Avoid any alcohol while taking this medicine. 08/14/20   Wendie Agreste, MD  omeprazole (PRILOSEC) 20 MG capsule Take 1 capsule (20 mg total) by mouth daily. 04/30/21   Wendie Agreste, MD  potassium chloride (KLOR-CON) 10 MEQ tablet Take 1 tablet (10 mEq total) by mouth daily. 10/21/20   Wendie Agreste, MD  predniSONE (DELTASONE) 5 MG tablet Take 5 mg by mouth daily with breakfast.     [provider]  simvastatin (ZOCOR) 40 MG tablet Take 1 tablet (40 mg total) by mouth daily. 04/30/21  Wendie Agreste, MD  Vitamin D, Ergocalciferol, (DRISDOL) 1.25 MG (50000 UNIT) CAPS capsule Take 1 capsule (50,000 Units total) by mouth every 7 (seven) days. 04/02/21   Wendie Agreste, MD    Allergies    Patient has no known allergies.  Review of Systems   Review of Systems  All other systems reviewed and are negative.   Physical Exam Updated Vital Signs BP (!) 151/94 (BP Location: Right Arm)   Pulse 82   Temp 98.9 F (37.2 C) (Oral)   Resp 20   Ht 1.6 m (5\' 3" )   Wt 83 kg   SpO2 100%   BMI 32.42 kg/m   Physical Exam Vitals and nursing note reviewed.  Constitutional:      General: She is not in acute distress.    Appearance: She is well-developed.  HENT:     Head: Normocephalic and atraumatic.     Right Ear: External ear normal.     Left Ear: External ear normal.  Eyes:     General: No  scleral icterus.       Right eye: No discharge.        Left eye: No discharge.     Conjunctiva/sclera: Conjunctivae normal.  Neck:     Trachea: No tracheal deviation.  Cardiovascular:     Rate and Rhythm: Normal rate and regular rhythm.  Pulmonary:     Effort: Pulmonary effort is normal. No respiratory distress.     Breath sounds: Normal breath sounds. No stridor. No wheezing or rales.  Abdominal:     General: Bowel sounds are normal. There is no distension.     Palpations: Abdomen is soft.     Tenderness: There is no abdominal tenderness. There is no guarding or rebound.  Musculoskeletal:        General: No tenderness.     Cervical back: Neck supple.  Skin:    General: Skin is warm and dry.     Findings: No rash.  Neurological:     Mental Status: She is alert.     Cranial Nerves: No cranial nerve deficit (no facial droop, extraocular movements intact, no slurred speech).     Sensory: No sensory deficit.     Motor: No abnormal muscle tone or seizure activity.     Coordination: Coordination normal.     ED Results / Procedures / Treatments   Labs (all labs ordered are listed, but only abnormal results are displayed) Labs Reviewed  RESP PANEL BY RT-PCR (FLU A&B, COVID) ARPGX2 - Abnormal; Notable for the following components:      Result Value   SARS Coronavirus 2 by RT PCR POSITIVE (*)    All other components within normal limits  COMPREHENSIVE METABOLIC PANEL - Abnormal; Notable for the following components:   Potassium 3.3 (*)    All other components within normal limits  CBC WITH DIFFERENTIAL/PLATELET    EKG EKG Interpretation  Date/Time:  Saturday May 01 2021 15:44:18 EDT Ventricular Rate:  74 PR Interval:  159 QRS Duration: 94 QT Interval:  379 QTC Calculation: 421 R Axis:   35 Text Interpretation: Sinus rhythm Low voltage, precordial leads RSR' in V1 or V2, right VCD or RVH No significant change since last tracing Confirmed by Dorie Rank [phone]) on 05/02/2021  1:06:45 PM   Radiology No results found.  Procedures Procedures   Medications Ordered in ED Medications  acetaminophen (TYLENOL) tablet 1,000 mg (1,000 mg Oral Given 05/01/21 1605)    ED Course  I have reviewed the triage vital signs and the nursing notes.  Pertinent labs & imaging results that were available during my care of the patient were reviewed by me and considered in my medical decision making (see chart for details).    MDM Rules/Calculators/A&P                          Patient's labs are unremarkable with the exception of her positive Covid test.  Patient is not vaccinated and we discussed the oral antiviral treatments specifically molnupiravir and paxlovid.  Pt is not interested in treatments.  She generally avoids taking any medications.  Currently patient is breathing easily.  No oxygen requirement.  Discussed warning signs and precautions. Final Clinical Impression(s) / ED Diagnoses Final diagnoses:  COVID-19 virus infection    Rx / DC Orders ED Discharge Orders    None       Dorie Rank, MD 05/02/21 1307

## 2021-05-02 ENCOUNTER — Telehealth: Payer: Self-pay | Admitting: Physician Assistant

## 2021-05-02 NOTE — Telephone Encounter (Signed)
Called to discuss with patient about COVID-19 symptoms and the use of one of the available treatments for those with mild to moderate Covid symptoms and at a high risk of hospitalization.  Pt appears to qualify for outpatient treatment due to co-morbid conditions and/or a member of an at-risk group in accordance with the FDA Emergency Use Authorization.    Symptom onset: 5/27 per ER notes2 Vaccinated: no Booster? no Immunocompromised? no Qualifiers: CVD, BMI unvaccinated NIH Criteria: 2   Unable to reach pt - left VM and my chart. Of note, Pt declined orals in ER.   Angelena Form

## 2021-05-11 ENCOUNTER — Telehealth: Payer: Self-pay | Admitting: Family Medicine

## 2021-05-11 NOTE — Telephone Encounter (Signed)
Ok for nurse visit

## 2021-05-11 NOTE — Telephone Encounter (Signed)
Patient would like to schedule a tb test for her place of employment - please advise

## 2021-05-11 NOTE — Telephone Encounter (Signed)
Patient will call back to schedule ?

## 2021-08-30 ENCOUNTER — Other Ambulatory Visit: Payer: Self-pay

## 2021-08-30 ENCOUNTER — Emergency Department (HOSPITAL_BASED_OUTPATIENT_CLINIC_OR_DEPARTMENT_OTHER)
Admission: EM | Admit: 2021-08-30 | Discharge: 2021-08-30 | Disposition: A | Discharge status: Home / Self Care | Payer: 59 | Attending: Emergency Medicine | Admitting: Emergency Medicine

## 2021-08-30 ENCOUNTER — Encounter (HOSPITAL_BASED_OUTPATIENT_CLINIC_OR_DEPARTMENT_OTHER): Payer: Self-pay

## 2021-08-30 DIAGNOSIS — R609 Edema, unspecified: Secondary | ICD-10-CM

## 2021-08-30 DIAGNOSIS — I10 Essential (primary) hypertension: Secondary | ICD-10-CM | POA: Insufficient documentation

## 2021-08-30 DIAGNOSIS — E876 Hypokalemia: Secondary | ICD-10-CM | POA: Insufficient documentation

## 2021-08-30 DIAGNOSIS — Z79899 Other long term (current) drug therapy: Secondary | ICD-10-CM | POA: Insufficient documentation

## 2021-08-30 DIAGNOSIS — Z87891 Personal history of nicotine dependence: Secondary | ICD-10-CM | POA: Insufficient documentation

## 2021-08-30 DIAGNOSIS — R600 Localized edema: Secondary | ICD-10-CM | POA: Insufficient documentation

## 2021-08-30 LAB — CBC WITH DIFFERENTIAL/PLATELET
Abs Immature Granulocytes: 0.01 10*3/uL (ref 0.00–0.07)
Basophils Absolute: 0 10*3/uL (ref 0.0–0.1)
Basophils Relative: 0 %
Eosinophils Absolute: 0.2 10*3/uL (ref 0.0–0.5)
Eosinophils Relative: 2 %
HCT: 40.9 % (ref 36.0–46.0)
Hemoglobin: 13.5 g/dL (ref 12.0–15.0)
Immature Granulocytes: 0 %
Lymphocytes Relative: 40 %
Lymphs Abs: 2.7 10*3/uL (ref 0.7–4.0)
MCH: 27.5 pg (ref 26.0–34.0)
MCHC: 33 g/dL (ref 30.0–36.0)
MCV: 83.3 fL (ref 80.0–100.0)
Monocytes Absolute: 0.7 10*3/uL (ref 0.1–1.0)
Monocytes Relative: 11 %
Neutro Abs: 3.2 10*3/uL (ref 1.7–7.7)
Neutrophils Relative %: 47 %
Platelets: 185 10*3/uL (ref 150–400)
RBC: 4.91 MIL/uL (ref 3.87–5.11)
RDW: 14 % (ref 11.5–15.5)
WBC: 6.8 10*3/uL (ref 4.0–10.5)
nRBC: 0 % (ref 0.0–0.2)

## 2021-08-30 LAB — BASIC METABOLIC PANEL
Anion gap: 10 (ref 5–15)
BUN: 11 mg/dL (ref 6–20)
CO2: 28 mmol/L (ref 22–32)
Calcium: 8.9 mg/dL (ref 8.9–10.3)
Chloride: 103 mmol/L (ref 98–111)
Creatinine, Ser: 0.85 mg/dL (ref 0.44–1.00)
GFR, Estimated: >60 mL/min (ref >60)
Glucose, Bld: 90 mg/dL (ref 70–99)
Potassium: 3.2 mmol/L — ABNORMAL LOW (ref 3.5–5.1)
Sodium: 141 mmol/L (ref 135–145)

## 2021-08-30 LAB — BRAIN NATRIURETIC PEPTIDE: B Natriuretic Peptide: 19.8 pg/mL (ref 0.0–100.0)

## 2021-08-30 MED ORDER — POTASSIUM CHLORIDE CRYS ER 20 MEQ PO TBCR
40.0000 meq | EXTENDED_RELEASE_TABLET | Freq: Once | ORAL | Status: AC
  Administered 2021-08-30: 40 meq via ORAL
  Filled 2021-08-30: qty 2

## 2021-08-30 MED ORDER — POTASSIUM CHLORIDE ER 20 MEQ PO TBCR: 20.0000 meq | EXTENDED_RELEASE_TABLET | Freq: Every day | ORAL | 0 refills | Status: DC

## 2021-08-30 NOTE — ED Provider Notes (Signed)
[facility] DEPT Provider Note   CSN: [id number] Arrival date & time: 08/30/21  0013     History Chief Complaint  Patient presents with   Leg Swelling    Frances Conley is a 60 y.o. female.  HPI     This a 60 year old female with a history of hypertension, hypercholesterolemia who presents with lower extremity swelling.  Patient reports that she had had increased bilateral lower extremity swelling over the last week.  She states normally she can elevate her legs and it will go away.  She does not reliably take her HCTZ because "sometimes I forget."  She denies chest pain or shortness of breath.  No history of heart failure.  Does report some dietary indiscretion.  She states that when she elevates her left leg specifically she feels that her foot goes tingly.  She has not noted any asymmetric swelling.  No known history of blood clots.  Denies any leg pain but does state that they are more sore when they get more swollen.  Past Medical History:  Diagnosis Date   Arthritis    both hips, hands, shoulders goes to Dr Glad[facility] Lighter, Jori Moll ortho md   [id number](253.6)    Hypercholesteremia    Hypertension    Ulcer     Patient Active Problem List   Diagnosis Date Noted   Hyperthyroidism 12/19/2020    Past Surgical History:  Procedure Laterality Date   CESAREAN SECTION     1981, 1986, 1984   TUBAL LIGATION     UTERINE FIBROID SURGERY       OB History   No obstetric history on file.     Family History  Problem Relation Age of Onset   Heart failure Mother    Cancer Mother    Diabetes Mother    Hyperlipidemia Mother    Hypertension Mother    Thyroid disease Mother    Heart failure Father    Chronic Renal Failure Father    Diabetes Father    Kidney disease Father    Hyperlipidemia Father    Hypertension Father    Heart failure Sister    Diabetes Sister    Heart failure Brother     Social History   Tobacco Use   Smoking status: Former     Packs/day: 0.50    Types: Cigarettes   Smokeless tobacco: Never  Vaping Use   Vaping Use: Never used  Substance Use Topics   Alcohol use: No   Drug use: Never    Home Medications Prior to Admission medications   Medication Sig Start Date End Date Taking? Authorizing Provider  hydrochlorothiazide (HYDRODIURIL) 25 MG tablet Take 1 tablet (25 mg total) by mouth daily. 04/30/21   Wendie Agreste, MD  HYDROcodone-acetaminophen (NORCO/VICODIN) 5-325 MG tablet Take one tablet at night for pain; may take up to every 6 hours as needed for pain if not working or driving 6/44/03   [provider]  loratadine (CLARITIN) 10 MG tablet Take 1 tablet (10 mg total) by mouth daily. 10/21/20   Wendie Agreste, MD  metroNIDAZOLE (FLAGYL) 500 MG tablet 1 pill by mouth twice per day.  Avoid any alcohol while taking this medicine. 08/14/20   Wendie Agreste, MD  omeprazole (PRILOSEC) 20 MG capsule Take 1 capsule (20 mg total) by mouth daily. 04/30/21   Wendie Agreste, MD  potassium chloride 20 MEQ TBCR Take 20 mEq by mouth daily. 08/30/21   Demeisha Geraghty, Barbette Hair, MD  predniSONE (  DELTASONE) 5 MG tablet Take 5 mg by mouth daily with breakfast.     [provider]  simvastatin (ZOCOR) 40 MG tablet Take 1 tablet (40 mg total) by mouth daily. 04/30/21   Wendie Agreste, MD  Vitamin D, Ergocalciferol, (DRISDOL) 1.25 MG (50000 UNIT) CAPS capsule Take 1 capsule (50,000 Units total) by mouth every 7 (seven) days. 04/02/21   Wendie Agreste, MD    Allergies    Patient has no known allergies.  Review of Systems   Review of Systems  Respiratory:  Negative for shortness of breath.   Cardiovascular:  Positive for leg swelling. Negative for chest pain.  Gastrointestinal:  Negative for abdominal pain, nausea and vomiting.  Genitourinary:  Negative for dysuria.  All other systems reviewed and are negative.  Physical Exam Updated Vital Signs BP 126/87   Pulse (!) 57   Temp 98.3 F (36.8 C)  (Oral)   Resp 17   Ht 1.6 m (5\' 3" )   Wt 85.5 kg   SpO2 98%   BMI 33.39 kg/m   Physical Exam Vitals and nursing note reviewed.  Constitutional:      Appearance: She is well-developed. She is obese. She is not ill-appearing.  HENT:     Head: Normocephalic and atraumatic.     Nose: Nose normal.     Mouth/Throat:     Mouth: Mucous membranes are moist.  Eyes:     Pupils: Pupils are equal, round, and reactive to light.  Cardiovascular:     Rate and Rhythm: Normal rate and regular rhythm.     Heart sounds: Normal heart sounds.  Pulmonary:     Effort: Pulmonary effort is normal. No respiratory distress.     Breath sounds: No wheezing.  Abdominal:     General: Bowel sounds are normal.     Palpations: Abdomen is soft.  Musculoskeletal:        General: No tenderness.     Cervical back: Neck supple.     Right lower leg: Edema present.     Left lower leg: Edema present.     Comments: 1+ bilateral lower extremity edema to the mid calf, no calf tenderness noted, no overlying skin changes  Skin:    General: Skin is warm and dry.  Neurological:     Mental Status: She is alert and oriented to person, place, and time.  Psychiatric:        Mood and Affect: Mood normal.    ED Results / Procedures / Treatments   Labs (all labs ordered are listed, but only abnormal results are displayed) Labs Reviewed  BASIC METABOLIC PANEL - Abnormal; Notable for the following components:      Result Value   Potassium 3.2 (*)    All other components within normal limits  CBC WITH DIFFERENTIAL/PLATELET  BRAIN NATRIURETIC PEPTIDE    EKG None  Radiology No results found.  Procedures Procedures   Medications Ordered in ED Medications  potassium chloride SA (KLOR-CON) CR tablet 40 mEq (has no administration in time range)    ED Course  I have reviewed the triage vital signs and the nursing notes.  Pertinent labs & imaging results that were available during my care of the patient were  reviewed by me and considered in my medical decision making (see chart for details).    MDM Rules/Calculators/A&P  Patient presents with bilateral lower extremity edema.  She is overall nontoxic-appearing and vital signs are reassuring.  She has trace to 1+ bilateral lower extremity edema which is symmetric on exam.  No signs or symptoms of cellulitis.  No indication of DVT as she has no asymmetry on exam.  Highly suspect that this is related to fluid shifts.  She has no history of heart failure.  She does report some cramping in her legs.  For this reason, we will obtain some basic lab work including metabolites to rule out metabolic derangement.  Patient does have some slight hypokalemia at 3.2.  She was given oral supplementation.  We will put her on a short course of potassium supplementation.  I have stressed to her that it is important that she take her HCTZ as recommended.  This with potassium supplementation should help her symptoms.  Recommend follow-up with her primary physician.  After history, exam, and medical workup I feel the patient has been appropriately medically screened and is safe for discharge home. Pertinent diagnoses were discussed with the patient. Patient was given return precautions.  Final Clinical Impression(s) / ED Diagnoses Final diagnoses:  Peripheral edema  Hypokalemia    Rx / DC Orders ED Discharge Orders          Ordered    potassium chloride 20 MEQ TBCR  Daily        08/30/21 0255             Merryl Hacker, MD 08/30/21 [phone]

## 2021-08-30 NOTE — Discharge Instructions (Signed)
You were seen today for lower extremity swelling.  This is likely related to fluid shifts in your body.  Make sure that you monitor your salt intake.  It is very important that you take your HCTZ as directed.  You have complained of some cramping.  Your potassium is slightly low.  This can be a result of taking HCTZ.  Make sure to take your potassium supplements while taking HCTZ.  Follow-up with your primary physician.

## 2021-08-30 NOTE — ED Triage Notes (Addendum)
Pt is present for increase swelling of bilateral legs x one week. Pt states her legs are sore and the swelling has not gone down. Pt takes HCTZ and did not take it yesterday morning. Denies CP or SOB. Pt states she has had more salt in her diet as well.

## 2021-09-16 ENCOUNTER — Other Ambulatory Visit: Payer: Self-pay

## 2021-09-16 ENCOUNTER — Encounter (HOSPITAL_BASED_OUTPATIENT_CLINIC_OR_DEPARTMENT_OTHER): Payer: Self-pay | Admitting: Obstetrics and Gynecology

## 2021-09-16 DIAGNOSIS — R600 Localized edema: Secondary | ICD-10-CM | POA: Insufficient documentation

## 2021-09-16 DIAGNOSIS — M7989 Other specified soft tissue disorders: Secondary | ICD-10-CM | POA: Insufficient documentation

## 2021-09-16 DIAGNOSIS — Z79899 Other long term (current) drug therapy: Secondary | ICD-10-CM | POA: Insufficient documentation

## 2021-09-16 DIAGNOSIS — Z20822 Contact with and (suspected) exposure to covid-19: Secondary | ICD-10-CM | POA: Insufficient documentation

## 2021-09-16 DIAGNOSIS — R252 Cramp and spasm: Secondary | ICD-10-CM | POA: Insufficient documentation

## 2021-09-16 DIAGNOSIS — I10 Essential (primary) hypertension: Secondary | ICD-10-CM | POA: Insufficient documentation

## 2021-09-16 DIAGNOSIS — Z87891 Personal history of nicotine dependence: Secondary | ICD-10-CM | POA: Insufficient documentation

## 2021-09-16 DIAGNOSIS — R110 Nausea: Secondary | ICD-10-CM | POA: Insufficient documentation

## 2021-09-16 DIAGNOSIS — E876 Hypokalemia: Secondary | ICD-10-CM | POA: Insufficient documentation

## 2021-09-16 NOTE — ED Triage Notes (Signed)
Patient presents to the ER for bilateral leg swelling, reports that she has not had any improvements since last visit. States she has also been having nausea without emesis. Patient reports her legs are sore and tingling and the tingling has increased today

## 2021-09-17 ENCOUNTER — Emergency Department (HOSPITAL_BASED_OUTPATIENT_CLINIC_OR_DEPARTMENT_OTHER)
Admission: EM | Admit: 2021-09-17 | Discharge: 2021-09-17 | Disposition: A | Discharge status: Home / Self Care | Payer: Self-pay | Attending: Emergency Medicine | Admitting: Emergency Medicine

## 2021-09-17 ENCOUNTER — Ambulatory Visit (HOSPITAL_BASED_OUTPATIENT_CLINIC_OR_DEPARTMENT_OTHER)
Admission: RE | Admit: 2021-09-17 | Discharge: 2021-09-17 | Disposition: A | Discharge status: Home / Self Care | Payer: Self-pay | Source: Ambulatory Visit | Attending: Emergency Medicine | Admitting: Emergency Medicine

## 2021-09-17 ENCOUNTER — Emergency Department (HOSPITAL_BASED_OUTPATIENT_CLINIC_OR_DEPARTMENT_OTHER): Payer: Self-pay

## 2021-09-17 ENCOUNTER — Ambulatory Visit (HOSPITAL_BASED_OUTPATIENT_CLINIC_OR_DEPARTMENT_OTHER): Admit: 2021-09-17 | Payer: Self-pay

## 2021-09-17 ENCOUNTER — Emergency Department (HOSPITAL_BASED_OUTPATIENT_CLINIC_OR_DEPARTMENT_OTHER): Payer: Self-pay | Admitting: Radiology

## 2021-09-17 ENCOUNTER — Encounter (HOSPITAL_BASED_OUTPATIENT_CLINIC_OR_DEPARTMENT_OTHER): Payer: Self-pay | Admitting: Radiology

## 2021-09-17 DIAGNOSIS — E876 Hypokalemia: Secondary | ICD-10-CM

## 2021-09-17 DIAGNOSIS — R609 Edema, unspecified: Secondary | ICD-10-CM

## 2021-09-17 DIAGNOSIS — R600 Localized edema: Secondary | ICD-10-CM

## 2021-09-17 LAB — COMPREHENSIVE METABOLIC PANEL
ALT: 15 U/L (ref 0–44)
AST: 16 U/L (ref 15–41)
Albumin: 4.2 g/dL (ref 3.5–5.0)
Alkaline Phosphatase: 82 U/L (ref 38–126)
Anion gap: 10 (ref 5–15)
BUN: 10 mg/dL (ref 6–20)
CO2: 29 mmol/L (ref 22–32)
Calcium: 9 mg/dL (ref 8.9–10.3)
Chloride: 104 mmol/L (ref 98–111)
Creatinine, Ser: 0.88 mg/dL (ref 0.44–1.00)
GFR, Estimated: >60 mL/min (ref >60)
Glucose, Bld: 98 mg/dL (ref 70–99)
Potassium: 2.8 mmol/L — ABNORMAL LOW (ref 3.5–5.1)
Sodium: 143 mmol/L (ref 135–145)
Total Bilirubin: 0.6 mg/dL (ref 0.3–1.2)
Total Protein: 7.2 g/dL (ref 6.5–8.1)

## 2021-09-17 LAB — RESP PANEL BY RT-PCR (FLU A&B, COVID) ARPGX2
Influenza A by PCR: NEGATIVE
Influenza B by PCR: NEGATIVE
SARS Coronavirus 2 by RT PCR: NEGATIVE

## 2021-09-17 LAB — CBC WITH DIFFERENTIAL/PLATELET
Abs Immature Granulocytes: 0.02 10*3/uL (ref 0.00–0.07)
Basophils Absolute: 0 10*3/uL (ref 0.0–0.1)
Basophils Relative: 0 %
Eosinophils Absolute: 0.1 10*3/uL (ref 0.0–0.5)
Eosinophils Relative: 2 %
HCT: 39.4 % (ref 36.0–46.0)
Hemoglobin: 13 g/dL (ref 12.0–15.0)
Immature Granulocytes: 0 %
Lymphocytes Relative: 32 %
Lymphs Abs: 2.1 10*3/uL (ref 0.7–4.0)
MCH: 27 pg (ref 26.0–34.0)
MCHC: 33 g/dL (ref 30.0–36.0)
MCV: 81.9 fL (ref 80.0–100.0)
Monocytes Absolute: 0.6 10*3/uL (ref 0.1–1.0)
Monocytes Relative: 9 %
Neutro Abs: 3.8 10*3/uL (ref 1.7–7.7)
Neutrophils Relative %: 57 %
Platelets: 191 10*3/uL (ref 150–400)
RBC: 4.81 MIL/uL (ref 3.87–5.11)
RDW: 14.1 % (ref 11.5–15.5)
WBC: 6.7 10*3/uL (ref 4.0–10.5)
nRBC: 0 % (ref 0.0–0.2)

## 2021-09-17 LAB — MAGNESIUM: Magnesium: 1.8 mg/dL (ref 1.7–2.4)

## 2021-09-17 LAB — D-DIMER, QUANTITATIVE: D-Dimer, Quant: 0.72 ug/mL-FEU — ABNORMAL HIGH (ref 0.00–0.50)

## 2021-09-17 MED ORDER — IOHEXOL 350 MG/ML SOLN
100.0000 mL | Freq: Once | INTRAVENOUS | Status: AC | PRN
  Administered 2021-09-17: 75 mL via INTRAVENOUS

## 2021-09-17 MED ORDER — POTASSIUM CHLORIDE CRYS ER 20 MEQ PO TBCR
40.0000 meq | EXTENDED_RELEASE_TABLET | Freq: Once | ORAL | Status: AC
  Administered 2021-09-17: 40 meq via ORAL
  Filled 2021-09-17: qty 2

## 2021-09-17 MED ORDER — POTASSIUM CHLORIDE 10 MEQ/100ML IV SOLN
10.0000 meq | INTRAVENOUS | Status: AC
  Administered 2021-09-17 (×2): 10 meq via INTRAVENOUS
  Filled 2021-09-17 (×2): qty 100

## 2021-09-17 NOTE — ED Provider Notes (Signed)
[facility] EMERGENC[facility] DEPT Provider Note   CSN: [id number] Arrival date & time: 09/16/21  2303     History Chief Complaint  Patient presents with   Leg Swelling    Frances Conley is a 60 y.o. female.  The history is provided by the patient and medical records.  Frances Conley is a 60 y.o. female who presents to the Emergency Department complaining of leg swelling.  She presents to the ED complaining of BLE edema for the last 1.5-2 weeks.  She reports that sxs are slightly worsening.  She is now having soreness as well as some cramping in the left leg.    No fever, chest pain, sob.  Has some associated nausea that started today.  No abdominal pain.  Has had two weeks of diarrhea. No back pain.  Has tingling/soreness in bilateral legs.  No weakness.     Has a hx/o HTN, HPL.  Takes hctz for HTN, forgot to take today. She was seen in the emergency department two weeks ago for similar symptoms and found to have hypokalemia and was prescribed potassium but she failed to take this medication. She does have the medication available.    Past Medical History:  Diagnosis Date   Arthritis    both hips, hands, shoulders goes to Dr Gladstone Lighter, Jori [facility]oll ortho md   [id number](025.8)    Hypercholesteremia    Hypertension    Ulcer     Patient Active Problem List   Diagnosis Date Noted   Hyperthyroidism 12/19/2020    Past Surgical History:  Procedure Laterality Date   CESAREAN SECTION     1981, 1986, 1984   TUBAL LIGATION     UTERINE FIBROID SURGERY       OB History     Gravida      Para      Term      Preterm      AB      Living  3      SAB      IAB      Ectopic      Multiple      Live Births              Family History  Problem Relation Age of Onset   Heart failure Mother    Cancer Mother    Diabetes Mother    Hyperlipidemia Mother    Hypertension Mother    Thyroid disease Mother    Heart failure Father    Chronic Renal Failure Father     Diabetes Father    Kidney disease Father    Hyperlipidemia Father    Hypertension Father    Heart failure Sister    Diabetes Sister    Heart failure Brother     Social History   Tobacco Use   Smoking status: Former    Packs/day: 0.50    Types: Cigarettes   Smokeless tobacco: Never  Vaping Use   Vaping Use: Never used  Substance Use Topics   Alcohol use: No   Drug use: Never    Home Medications Prior to Admission medications   Medication Sig Start Date End Date Taking? Authorizing Provider  hydrochlorothiazide (HYDRODIURIL) 25 MG tablet Take 1 tablet (25 mg total) by mouth daily. 04/30/21   Wendie Agreste, MD  HYDROcodone-acetaminophen (NORCO/VICODIN) 5-325 MG tablet Take one tablet at night for pain; may take up to every 6 hours as needed for pain if not working or driving 04/30/77  [provider]  loratadine (CLARITIN) 10 MG tablet Take 1 tablet (10 mg total) by mouth daily. 10/21/20   Wendie Agreste, MD  metroNIDAZOLE (FLAGYL) 500 MG tablet 1 pill by mouth twice per day.  Avoid any alcohol while taking this medicine. 08/14/20   Wendie Agreste, MD  omeprazole (PRILOSEC) 20 MG capsule Take 1 capsule (20 mg total) by mouth daily. 04/30/21   Wendie Agreste, MD  potassium chloride 20 MEQ TBCR Take 20 mEq by mouth daily. 08/30/21   Horton, Barbette Hair, MD  predniSONE (DELTASONE) 5 MG tablet Take 5 mg by mouth daily with breakfast.     [provider]  simvastatin (ZOCOR) 40 MG tablet Take 1 tablet (40 mg total) by mouth daily. 04/30/21   Wendie Agreste, MD  Vitamin D, Ergocalciferol, (DRISDOL) 1.25 MG (50000 UNIT) CAPS capsule Take 1 capsule (50,000 Units total) by mouth every 7 (seven) days. 04/02/21   Wendie Agreste, MD    Allergies    Patient has no known allergies.  Review of Systems   Review of Systems  All other systems reviewed and are negative.  Physical Exam Updated Vital Signs BP (!) 132/100 (BP Location: Right Arm)   Pulse 68   Temp  98.2 F (36.8 C)   Resp 16   Ht 5\' 3"  (1.6 m)   Wt 85.5 kg   SpO2 100%   BMI 33.39 kg/m   Physical Exam Vitals and nursing note reviewed.  Constitutional:      Appearance: She is well-developed.  HENT:     Head: Normocephalic and atraumatic.  Cardiovascular:     Rate and Rhythm: Normal rate and regular rhythm.     Heart sounds: No murmur heard. Pulmonary:     Effort: Pulmonary effort is normal. No respiratory distress.     Breath sounds: Normal breath sounds.  Abdominal:     Palpations: Abdomen is soft.     Tenderness: There is no abdominal tenderness. There is no guarding or rebound.  Musculoskeletal:        General: No tenderness.     Comments: 1+ pitting edema to BLE.  2+ DP pulses bilaterally.  LLE slightly more swollen than right.  No significant erythema  Skin:    General: Skin is warm and dry.  Neurological:     Mental Status: She is alert and oriented to person, place, and time.  Psychiatric:        Behavior: Behavior normal.    ED Results / Procedures / Treatments   Labs (all labs ordered are listed, but only abnormal results are displayed) Labs Reviewed  COMPREHENSIVE METABOLIC PANEL - Abnormal; Notable for the following components:      Result Value   Potassium 2.8 (*)    All other components within normal limits  D-DIMER, QUANTITATIVE - Abnormal; Notable for the following components:   D-Dimer, Quant 0.72 (*)    All other components within normal limits  RESP PANEL BY RT-PCR (FLU A&B, COVID) ARPGX2  CBC WITH DIFFERENTIAL/PLATELET  MAGNESIUM    EKG EKG Interpretation  Date/Time:  Friday September 17 2021 05:42:12 EDT Ventricular Rate:  52 PR Interval:  168 QRS Duration: 96 QT Interval:  432 QTC Calculation: 402 R Axis:   51 Text Interpretation: Sinus rhythm RSR' in V1 or V2, right VCD or RVH Confirmed by Quintella Reichert [phone]) on 09/17/2021 5:46:00 AM  Radiology DG Chest 2 View  Result Date: 09/17/2021 CLINICAL DATA:  Peripheral edema EXAM:  CHEST - 2 VIEW COMPARISON:  08/13/2019 FINDINGS: The heart size and mediastinal contours are within normal limits. Both lungs are clear. The visualized skeletal structures are unremarkable. IMPRESSION: No active cardiopulmonary disease. Electronically Signed   By: Fidela Salisbury M.D.   On: 09/17/2021 03:57   CT Angio Chest PE W/Cm &/Or Wo Cm  Result Date: 09/17/2021 CLINICAL DATA:  Leg swelling and positive D-dimer EXAM: CT ANGIOGRAPHY CHEST WITH CONTRAST TECHNIQUE: Multidetector CT imaging of the chest was performed using the standard protocol during bolus administration of intravenous contrast. Multiplanar CT image reconstructions and MIPs were obtained to evaluate the vascular anatomy. CONTRAST:  27mL OMNIPAQUE IOHEXOL 350 MG/ML SOLN COMPARISON:  03/27/2009 FINDINGS: Cardiovascular: Satisfactory opacification of the pulmonary arteries to the segmental level. No evidence of pulmonary embolism. Normal heart size. No pericardial effusion. Mediastinum/Nodes: Negative for adenopathy or mass Lungs/Pleura: Lungs are clear. No pleural effusion or pneumothorax. 3 mm right middle lobe nodule that is stable from 2010 and benign Upper Abdomen: Negative Musculoskeletal: No acute finding Review of the MIP images confirms the above findings. IMPRESSION: Negative for pulmonary embolism or other acute finding Electronically Signed   By: Jorje Guild M.D.   On: 09/17/2021 05:27    Procedures Procedures   Medications Ordered in ED Medications  potassium chloride 10 mEq in 100 mL IVPB (10 mEq Intravenous New Bag/Given 09/17/21 0604)  potassium chloride SA (KLOR-CON) CR tablet 40 mEq (40 mEq Oral Given 09/17/21 0418)  iohexol (OMNIPAQUE) 350 MG/ML injection 100 mL (75 mLs Intravenous Contrast Given 09/17/21 0427)    ED Course  I have reviewed the triage vital signs and the nursing notes.  Pertinent labs & imaging results that were available during my care of the patient were reviewed by me and considered in my  medical decision making (see chart for details).    MDM Rules/Calculators/A&P                          patient here for evaluation of progressive bilateral lower extremity edema, left greater than right. She has edema on examination without evidence of cellulitis. No evidence of acute CHF. Labs are significant for hypokalemia today. She was recently prescribed potassium but did not take it. She was replead in the department with IV and oral potassium. Discussed importance of continuing this medication as an outpatient. She does have the medication available in her vehicle. A D dimer was obtained given her asymmetric edema, this was elevated and a CTA was obtained, which was negative for PE or acute abnormality. Discussed with patient importance of obtaining vascular ultrasound to rule out DVT. No ultrasound is available at this time, will discharge patient with plan to return later for ultrasound to further evaluate her edema. Discussed empiric treatment with blood thinner prior to her ultrasound and patient declines at this time. Discussed home care for lower extremity edema as well.  Final Clinical Impression(s) / ED Diagnoses Final diagnoses:  Hypokalemia  Lower extremity edema    Rx / DC Orders ED Discharge Orders          Ordered    US Venous Img Lower Bilateral        09/17/21 0605             Quintella Reichert, MD 09/17/21 [phone]

## 2021-10-30 ENCOUNTER — Other Ambulatory Visit: Payer: Self-pay | Admitting: Family Medicine

## 2021-12-08 ENCOUNTER — Emergency Department (HOSPITAL_BASED_OUTPATIENT_CLINIC_OR_DEPARTMENT_OTHER): Payer: Self-pay | Admitting: Radiology

## 2021-12-08 ENCOUNTER — Other Ambulatory Visit: Payer: Self-pay

## 2021-12-08 ENCOUNTER — Telehealth: Payer: Self-pay | Admitting: Family Medicine

## 2021-12-08 ENCOUNTER — Emergency Department (HOSPITAL_BASED_OUTPATIENT_CLINIC_OR_DEPARTMENT_OTHER)
Admission: EM | Admit: 2021-12-08 | Discharge: 2021-12-08 | Disposition: A | Discharge status: Home / Self Care | Payer: Self-pay | Attending: Emergency Medicine | Admitting: Emergency Medicine

## 2021-12-08 ENCOUNTER — Encounter (HOSPITAL_BASED_OUTPATIENT_CLINIC_OR_DEPARTMENT_OTHER): Payer: Self-pay | Admitting: Emergency Medicine

## 2021-12-08 DIAGNOSIS — R609 Edema, unspecified: Secondary | ICD-10-CM

## 2021-12-08 DIAGNOSIS — R600 Localized edema: Secondary | ICD-10-CM | POA: Insufficient documentation

## 2021-12-08 LAB — CBC WITH DIFFERENTIAL/PLATELET
Abs Immature Granulocytes: 0.03 10*3/uL (ref 0.00–0.07)
Basophils Absolute: 0.1 10*3/uL (ref 0.0–0.1)
Basophils Relative: 1 %
Eosinophils Absolute: 0.2 10*3/uL (ref 0.0–0.5)
Eosinophils Relative: 2 %
HCT: 40.1 % (ref 36.0–46.0)
Hemoglobin: 13.1 g/dL (ref 12.0–15.0)
Immature Granulocytes: 0 %
Lymphocytes Relative: 30 %
Lymphs Abs: 2.5 10*3/uL (ref 0.7–4.0)
MCH: 27 pg (ref 26.0–34.0)
MCHC: 32.7 g/dL (ref 30.0–36.0)
MCV: 82.7 fL (ref 80.0–100.0)
Monocytes Absolute: 0.8 10*3/uL (ref 0.1–1.0)
Monocytes Relative: 9 %
Neutro Abs: 4.7 10*3/uL (ref 1.7–7.7)
Neutrophils Relative %: 58 %
Platelets: 214 10*3/uL (ref 150–400)
RBC: 4.85 MIL/uL (ref 3.87–5.11)
RDW: 14 % (ref 11.5–15.5)
WBC: 8.2 10*3/uL (ref 4.0–10.5)
nRBC: 0 % (ref 0.0–0.2)

## 2021-12-08 LAB — BASIC METABOLIC PANEL
Anion gap: 11 (ref 5–15)
BUN: 12 mg/dL (ref 6–20)
CO2: 26 mmol/L (ref 22–32)
Calcium: 8.8 mg/dL — ABNORMAL LOW (ref 8.9–10.3)
Chloride: 105 mmol/L (ref 98–111)
Creatinine, Ser: 0.85 mg/dL (ref 0.44–1.00)
GFR, Estimated: >60 mL/min (ref >60)
Glucose, Bld: 77 mg/dL (ref 70–99)
Potassium: 3.7 mmol/L (ref 3.5–5.1)
Sodium: 142 mmol/L (ref 135–145)

## 2021-12-08 LAB — TROPONIN I (HIGH SENSITIVITY): Troponin I (High Sensitivity): 5 ng/L (ref <18)

## 2021-12-08 LAB — BRAIN NATRIURETIC PEPTIDE: B Natriuretic Peptide: 66 pg/mL (ref 0.0–100.0)

## 2021-12-08 MED ORDER — ALBUTEROL SULFATE HFA 108 (90 BASE) MCG/ACT IN AERS: 2.0000 | INHALATION_SPRAY | RESPIRATORY_TRACT | Status: DC | PRN

## 2021-12-08 NOTE — Telephone Encounter (Signed)
Patient went to ED as directed.

## 2021-12-08 NOTE — ED Provider Notes (Signed)
[facility] Provider Note   CSN: [id number] Arrival date & time: 12/08/21 1619  History Chief Complaint  Patient presents with   Leg Swelling    bilateral    Frances Conley is a 61 y.o. female here for BLE swelling ongoing for the last several months. She was in the ED twice in Oct for same with negative workups except for mild hypokalemia. She has been lost to follow up due to lack of insurance but she states she will be back on an insurance plan in a couple of weeks. She called her old PCP to make an appointment today and was told to come back to the ED although it isn't clear why. She denies any chest pains. Some mild SOB. No fever    Home Medications Prior to Admission medications   Medication Sig Start Date End Date Taking? Authorizing Provider  hydrochlorothiazide (HYDRODIURIL) 25 MG tablet Take 1 tablet (25 mg total) by mouth daily. 04/30/21   Wendie Agreste, MD  HYDROcodone-acetaminophen (NORCO/VICODIN) 5-325 MG tablet Take one tablet at night for pain; may take up to every 6 hours as needed for pain if not working or driving 06/04/15   [provider]  loratadine (CLARITIN) 10 MG tablet Take 1 tablet (10 mg total) by mouth daily. 10/21/20   Wendie Agreste, MD  metroNIDAZOLE (FLAGYL) 500 MG tablet 1 pill by mouth twice per day.  Avoid any alcohol while taking this medicine. 08/14/20   Wendie Agreste, MD  omeprazole (PRILOSEC) 20 MG capsule Take 1 capsule (20 mg total) by mouth daily. 04/30/21   Wendie Agreste, MD  potassium chloride 20 MEQ TBCR Take 20 mEq by mouth daily. 08/30/21   Horton, Barbette Hair, MD  potassium chloride SA (KLOR-CON) 20 MEQ tablet TAKE 1 TABLET BY MOUTH DAILY 10/31/21   Wendie Agreste, MD  predniSONE (DELTASONE) 5 MG tablet Take 5 mg by mouth daily with breakfast.     [provider]  simvastatin (ZOCOR) 40 MG tablet Take 1 tablet (40 mg total) by mouth daily. 04/30/21   Wendie Agreste, MD  Vitamin  D, Ergocalciferol, (DRISDOL) 1.25 MG (50000 UNIT) CAPS capsule Take 1 capsule (50,000 Units total) by mouth every 7 (seven) days. 04/02/21   Wendie Agreste, MD     Allergies    Patient has no known allergies.   Review of Systems   Review of Systems Please see HPI for pertinent positives and negatives  Physical Exam BP (!) 155/91    Pulse 62    Temp 98 F (36.7 C)    Resp 14    Wt 92.5 kg    SpO2 100%    BMI 36.14 kg/m   Physical Exam Vitals and nursing note reviewed.  Constitutional:      Appearance: Normal appearance.  HENT:     Head: Normocephalic and atraumatic.     Nose: Nose normal.     Mouth/Throat:     Mouth: Mucous membranes are moist.  Eyes:     Extraocular Movements: Extraocular movements intact.     Conjunctiva/sclera: Conjunctivae normal.  Cardiovascular:     Rate and Rhythm: Normal rate.  Pulmonary:     Effort: Pulmonary effort is normal.     Breath sounds: Normal breath sounds.  Abdominal:     General: Abdomen is flat.     Palpations: Abdomen is soft.     Tenderness: There is no abdominal tenderness.  Musculoskeletal:  General: No swelling. Normal range of motion.     Cervical back: Neck supple.     Right lower leg: Edema present.     Left lower leg: Edema present.     Comments: Edema is trace and symmetric in BLE, not past the knee  Skin:    General: Skin is warm and dry.  Neurological:     General: No focal deficit present.     Mental Status: She is alert.  Psychiatric:        Mood and Affect: Mood normal.    ED Results / Procedures / Treatments   EKG None  Procedures Procedures  Medications Ordered in the ED Medications  albuterol (VENTOLIN HFA) 108 (90 Base) MCG/ACT inhaler 2 puff (has no administration in time range)    Initial Impression and Plan  Patient here at the advice of her PCP for evaluation of her chronic LE edema. She does not have any other concerning symptoms. She has had labs and CXR ordered in triage which are  unremarkable. No signs of CHF, liver failure, renal failure or other acute lifethreatning cause of LE swelling. Encouraged to follow up with PCP to re-establish care and for long term management of her symptoms.   ED Course       MDM Rules/Calculators/A&P Medical Decision Making Problems Addressed: Peripheral edema: chronic illness or injury  Amount and/or Complexity of Data Reviewed Labs: ordered. Decision-making details documented in ED Course. Radiology: ordered and independent interpretation performed.    Final Clinical Impression(s) / ED Diagnoses Final diagnoses:  Peripheral edema    Rx / DC Orders ED Discharge Orders     None        Truddie Hidden, MD 12/08/21 2137

## 2021-12-08 NOTE — Telephone Encounter (Signed)
Leg Swelling And Edema Reason for Call Symptomatic / Request for Health Information Initial Comment Caller states she has been experiencing leg swelling, pain and difficulty walking. [facility] Not Listed [facility] Translation No Nurse Assessment Nurse: Zenia Resides, RN, Diane Date/Time (Eastern Time): 12/08/2021 11:09:33 AM Confirm and document reason for call. If symptomatic, describe symptoms. ---Caller states she has been experiencing leg swelling, pain and difficulty walking. Symptoms started a month ago. Went to ER and was supposed to follow up, but was waiting for insurance. Swelling up to her knees. Does the patient have any new or worsening symptoms? ---Yes Will a triage be completed? ---Yes Related visit to physician within the last 2 weeks? ---Yes Does the PT have any chronic conditions? (i.e. diabetes, asthma, this includes High risk factors for pregnancy, etc.) ---Yes List chronic conditions. ---HTN, high cholesterol, hypothyroid Is this a behavioral health or substance abuse call? ---No Guidelines Guideline Title Affirmed Question Affirmed Notes Nurse Date/Time Eilene Ghazi Time) Leg Swelling and Edema Difficulty breathing at rest Zenia Resides, RN, Diane 12/08/2021 11:11:33 AM Disp. Time Eilene Ghazi Time) Disposition Final User PLEASE NOTE: All timestamps contained within this report are represented as [place] Standard Time. CONFIDENTIALTY NOTICE: This fax transmission is intended only for the addressee. It contains information that is legally privileged, confidential or otherwise protected from use or disclosure. If you are not the intended recipient, you are strictly prohibited from reviewing, disclosing, copying using or disseminating any of this information or taking any action in reliance on or regarding this information. If you have received this fax in error, please notify us immediately by telephone so that we can arrange for its return to [place]. Phone: [phone],  Toll-Free: [phone], Fax: [phone] Page: 2 of 2 Call Id: [id number] 12/08/2021 11:14:16 AM Go to ED Now Yes Zenia Resides, RN, Diane Caller Disagree/Comply Comply Caller Understands Yes PreDisposition Call Doctor    Care Advice Given Per Guideline   GO TO ED NOW: * You need to be seen in the Emergency Department. NOTE TO TRIAGER - DRIVING: * Another adult should drive. CARE ADVICE given per Leg Swelling and Edema (Adult) guideline. Comments User: Hildred Priest, RN Date/Time Eilene Ghazi Time): 12/08/2021 11:12:12 AM Had a potassium level of 2.? Taking Potassium supplement, but out for 2 weeks.

## 2021-12-08 NOTE — ED Triage Notes (Addendum)
Bilateral leg swelling from knee down x 1 month . Hx arthritis  Also reports shortness of breath with excertion , weigh gain .

## 2021-12-08 NOTE — Telephone Encounter (Signed)
FYI

## 2021-12-10 ENCOUNTER — Ambulatory Visit: Payer: Self-pay | Admitting: Registered Nurse

## 2022-01-27 ENCOUNTER — Ambulatory Visit (INDEPENDENT_AMBULATORY_CARE_PROVIDER_SITE_OTHER): Payer: 59 | Admitting: Family Medicine

## 2022-01-27 ENCOUNTER — Telehealth: Payer: Self-pay | Admitting: Family Medicine

## 2022-01-27 VITALS — BP 128/76 | HR 74 | Temp 98.0°F | Resp 16 | Ht 63.0 in | Wt 209.4 lb

## 2022-01-27 DIAGNOSIS — Z87898 Personal history of other specified conditions: Secondary | ICD-10-CM

## 2022-01-27 DIAGNOSIS — R609 Edema, unspecified: Secondary | ICD-10-CM

## 2022-01-27 DIAGNOSIS — E782 Mixed hyperlipidemia: Secondary | ICD-10-CM

## 2022-01-27 DIAGNOSIS — R600 Localized edema: Secondary | ICD-10-CM

## 2022-01-27 DIAGNOSIS — R0609 Other forms of dyspnea: Secondary | ICD-10-CM

## 2022-01-27 DIAGNOSIS — E0590 Thyrotoxicosis, unspecified without thyrotoxic crisis or storm: Secondary | ICD-10-CM

## 2022-01-27 DIAGNOSIS — R7989 Other specified abnormal findings of blood chemistry: Secondary | ICD-10-CM

## 2022-01-27 DIAGNOSIS — I10 Essential (primary) hypertension: Secondary | ICD-10-CM

## 2022-01-27 LAB — LIPID PANEL
Cholesterol: 192 mg/dL (ref 0–200)
HDL: 36.5 mg/dL — ABNORMAL LOW (ref >39.00)
LDL Cholesterol: 135 mg/dL — ABNORMAL HIGH (ref 0–99)
NonHDL: 155.76
Total CHOL/HDL Ratio: 5
Triglycerides: 105 mg/dL (ref 0.0–149.0)
VLDL: 21 mg/dL (ref 0.0–40.0)

## 2022-01-27 LAB — COMPREHENSIVE METABOLIC PANEL
ALT: 24 U/L (ref 0–35)
AST: 21 U/L (ref 0–37)
Albumin: 4.1 g/dL (ref 3.5–5.2)
Alkaline Phosphatase: 79 U/L (ref 39–117)
BUN: 11 mg/dL (ref 6–23)
CO2: 30 mEq/L (ref 19–32)
Calcium: 8.7 mg/dL (ref 8.4–10.5)
Chloride: 107 mEq/L (ref 96–112)
Creatinine, Ser: 0.86 mg/dL (ref 0.40–1.20)
GFR: 73.76 mL/min (ref >60.00)
Glucose, Bld: 78 mg/dL (ref 70–99)
Potassium: 3.8 mEq/L (ref 3.5–5.1)
Sodium: 144 mEq/L (ref 135–145)
Total Bilirubin: 0.6 mg/dL (ref 0.2–1.2)
Total Protein: 6.9 g/dL (ref 6.0–8.3)

## 2022-01-27 LAB — CBC
HCT: 39.7 % (ref 36.0–46.0)
Hemoglobin: 13.1 g/dL (ref 12.0–15.0)
MCHC: 32.9 g/dL (ref 30.0–36.0)
MCV: 83.1 fl (ref 78.0–100.0)
Platelets: 188 10*3/uL (ref 150.0–400.0)
RBC: 4.78 Mil/uL (ref 3.87–5.11)
RDW: 14.3 % (ref 11.5–15.5)
WBC: 6.4 10*3/uL (ref 4.0–10.5)

## 2022-01-27 LAB — VITAMIN D 25 HYDROXY (VIT D DEFICIENCY, FRACTURES): VITD: 25.13 ng/mL — ABNORMAL LOW (ref 30.00–100.00)

## 2022-01-27 MED ORDER — SIMVASTATIN 40 MG PO TABS: 40.0000 mg | ORAL_TABLET | Freq: Every day | ORAL | 1 refills | Status: DC

## 2022-01-27 MED ORDER — HYDROCHLOROTHIAZIDE 25 MG PO TABS: 25.0000 mg | ORAL_TABLET | Freq: Every day | ORAL | 2 refills | Status: DC

## 2022-01-27 MED ORDER — OMEPRAZOLE 20 MG PO CPDR: 20.0000 mg | DELAYED_RELEASE_CAPSULE | Freq: Every day | ORAL | 3 refills | Status: DC

## 2022-01-27 MED ORDER — TRUFORM STOCKINGS 10-20MMHG MISC: 1.0000 "application " | Freq: Every day | 0 refills | Status: AC

## 2022-01-27 NOTE — Telephone Encounter (Signed)
She states that in order for the insurance company to cover the stockings she states she needs the CPT code and she also wanted to know if a new script can be written for 2 pair of compression stocking.   She states that she would like to go to [id number] to get these.  Please advise

## 2022-01-27 NOTE — Telephone Encounter (Signed)
New rx printed

## 2022-01-27 NOTE — Progress Notes (Signed)
Subjective:  Patient ID: Frances Conley, female    DOB: [birth year]  Age: 61 y.o. MRN: [id number]  CC:  Chief Complaint  Patient presents with   Leg Swelling    Pt here today for leg swelling, pt reports 2 months or so has been to the hospital no change    vitamin D    Pt due for recheck    Hyperlipidemia    Due for refills and recheck     HPI Frances Conley presents for   Unilateral leg swelling See recent ER visits.  Most recently January 4.  Per that note she has had bilateral lower extremity swelling for few months.  Negative work-ups in the past except for mild hypokalemia.  Most recent ER visit January 4 with normal troponin, CBC, BN P, BMP except calcium borderline low at 8.8.  Normal potassium of 3.7 at that time.  Chest x-ray with no active cardiopulmonary disease.  Lower extremity Doppler on 09/17/2021 negative for DVT, chest CT angio negative for PE at that time.  Last visit with me in May 2022 for hyperthyroidism, other med follow-ups.  Had previously been evaluated in January 2022 by endocrinology, mild hyperthyroidism due to small multinodular goiter and Graves' disease.  No new medications but recommended TSH monitoring 1-2 times per year and follow-up if new symptoms or TSH below 0.2. Lab Results  Component Value Date   TSH 0.470 12/01/2020  Still has swelling in both legs. Up and down. Sometimes worse, sometimes better.  No added salt to food. Has been eating processed meats.  Not taking HCTZ as directed. Off past 4 days. Usually 4 days per week - not taking at times as concern as not wanting to go to bathroom.   No chest pain. Feels full. Short of breath with activity.   Vitamin D deficiency Vitamin D 50,000 units weekly in past, no meds in months. No recent testing.  Last vitamin D Lab Results  Component Value Date   VD25OH 25.13 (L) 01/27/2022   Hyperlipidemia: Zocor 40 mg daily Rx - not taken in past month.  Ran out of meds, no SE on meds.  Lab Results   Component Value Date   CHOL 192 01/27/2022   HDL 36.50 (L) 01/27/2022   LDLCALC 135 (H) 01/27/2022   TRIG 105.0 01/27/2022   CHOLHDL 5 01/27/2022   Lab Results  Component Value Date   ALT 24 01/27/2022   AST 21 01/27/2022   ALKPHOS 79 01/27/2022   BILITOT 0.6 01/27/2022   Hypertension: HCTZ 25 mg daily.  Potassium 20 mg daily Home readings: BP Readings from Last 3 Encounters:  01/27/22 128/76  12/08/21 (!) 155/91  09/17/21 130/84   Lab Results  Component Value Date   CREATININE 0.86 01/27/2022      History Patient Active Problem List   Diagnosis Date Noted   Hyperthyroidism 12/19/2020   Past Medical History:  Diagnosis Date   Arthritis    both hips, hands, shoulders goes to Dr Gladstone Lighter, [facility] ortho md   [id number](097.3)    Hypercholesteremia    Hypertension    Ulcer    Past Surgical History:  Procedure Laterality Date   [place], 1984   TUBAL LIGATION     UTERINE FIBROID SURGERY     No Known Allergies Prior to Admission medications   Medication Sig Start Date End Date Taking? Authorizing Provider  hydrochlorothiazide (HYDRODIURIL) 25 MG tablet Take 1 tablet (25 mg  total) by mouth daily. 04/30/21  Yes Wendie Agreste, MD  HYDROcodone-acetaminophen (NORCO/VICODIN) 5-325 MG tablet Take one tablet at night for pain; may take up to every 6 hours as needed for pain if not working or driving 6/62/94  Yes [provider]  loratadine (CLARITIN) 10 MG tablet Take 1 tablet (10 mg total) by mouth daily. 10/21/20  Yes Wendie Agreste, MD  metroNIDAZOLE (FLAGYL) 500 MG tablet 1 pill by mouth twice per day.  Avoid any alcohol while taking this medicine. 08/14/20  Yes Wendie Agreste, MD  omeprazole (PRILOSEC) 20 MG capsule Take 1 capsule (20 mg total) by mouth daily. 04/30/21  Yes Wendie Agreste, MD  potassium chloride 20 MEQ TBCR Take 20 mEq by mouth daily. 08/30/21  Yes Horton, Barbette Hair, MD  potassium chloride SA (KLOR-CON) 20 MEQ  tablet TAKE 1 TABLET BY MOUTH DAILY 10/31/21  Yes Wendie Agreste, MD  predniSONE (DELTASONE) 5 MG tablet Take 5 mg by mouth daily with breakfast.    Yes [provider]  simvastatin (ZOCOR) 40 MG tablet Take 1 tablet (40 mg total) by mouth daily. 04/30/21  Yes Wendie Agreste, MD  Vitamin D, Ergocalciferol, (DRISDOL) 1.25 MG (50000 UNIT) CAPS capsule Take 1 capsule (50,000 Units total) by mouth every 7 (seven) days. 04/02/21  Yes Wendie Agreste, MD   Social History   Socioeconomic History   Marital status: Legally Separated    Spouse name: Not on file   Number of children: Not on file   Years of education: Not on file   Highest education level: Not on file  Occupational History   Not on file  Tobacco Use   Smoking status: Former    Packs/day: 0.50    Types: Cigarettes   Smokeless tobacco: Never  Vaping Use   Vaping Use: Never used  Substance and Sexual Activity   Alcohol use: No   Drug use: Never   Sexual activity: Not on file  Other Topics Concern   Not on file  Social History Narrative   Not on file   Social Determinants of Health   Financial Resource Strain: Not on file  Food Insecurity: Not on file  Transportation Needs: Not on file  Physical Activity: Not on file  Stress: Not on file  Social Connections: Not on file  Intimate Partner Violence: Not on file    Review of Systems   Objective:   Vitals:   01/27/22 1129  BP: 128/76  Pulse: 74  Resp: 16  Temp: 98 F (36.7 C)  TempSrc: Temporal  SpO2: 97%  Weight: 209 lb 6.4 oz (95 kg)  Height: 5\' 3"  (1.6 m)     Physical Exam Vitals reviewed.  Constitutional:      Appearance: Normal appearance. She is well-developed.  HENT:     Head: Normocephalic and atraumatic.  Eyes:     Conjunctiva/sclera: Conjunctivae normal.     Pupils: Pupils are equal, round, and reactive to light.  Neck:     Vascular: No carotid bruit.  Cardiovascular:     Rate and Rhythm: Normal rate and regular rhythm.      Heart sounds: Normal heart sounds. No murmur heard. Pulmonary:     Effort: Pulmonary effort is normal. No respiratory distress.     Breath sounds: Normal breath sounds. No wheezing or rales.  Abdominal:     Palpations: Abdomen is soft. There is no pulsatile mass.     Tenderness: There is no abdominal tenderness.  Musculoskeletal:     Right lower leg: Edema (pitting, to upper calves bilat, neg homans, no calf pain) present.     Left lower leg: Edema present.  Skin:    General: Skin is warm and dry.  Neurological:     Mental Status: She is alert and oriented to person, place, and time.  Psychiatric:        Mood and Affect: Mood normal.        Behavior: Behavior normal.       Assessment & Plan:  CAEDENCE SNOWDEN is a 61 y.o. female . DOE (dyspnea on exertion) - Plan: Ambulatory referral to Cardiology, Pro b natriuretic peptide, CBC Peripheral edema - Plan: Ambulatory referral to Cardiology, Pro b natriuretic peptide, CBC  -Previous ER visits noted including most recent visit with normal BNP.  Incomplete med adherence, obesity, dietary sodium intake may all be contributing.  - Less likely CHF but will refer to cardiology for further evaluation given associated dyspnea on exertion. Repeat BNP.  ER precautions given if acute changes or chest pains.  -Decrease sodium intake, handout given on high sodium foods, Salty six.   -Check CBC, BMP.  -Updated TSH ordered given previous hyperthyroidism, eval for change to hypothyroidism.  -Prescription given for compression stockings.  Mixed hyperlipidemia - Plan: simvastatin (ZOCOR) 40 MG tablet, Lipid panel, Comprehensive metabolic panel  -Tolerating Zocor.  Continue same, updated labs ordered.  History of ulcer disease - Plan: omeprazole (PRILOSEC) 20 MG capsule, CBC  -Check CBC, continue omeprazole.  Low serum vitamin D - Plan: VITAMIN D 25 Hydroxy (Vit-D Deficiency, Fractures)  -Updated vitamin D ordered as above.  May need over-the-counter  versus prescription dose restart.  Recently off meds.  Essential hypertension - Plan: hydrochlorothiazide (HYDRODIURIL) 25 MG tablet  -Stable in office readings, continue same meds, med adherence discussed.   Hyperthyroidism - Plan: Comprehensive metabolic panel  -Check TSH.  Meds ordered this encounter  Medications   simvastatin (ZOCOR) 40 MG tablet    Sig: Take 1 tablet (40 mg total) by mouth daily.    Dispense:  90 tablet    Refill:  1   omeprazole (PRILOSEC) 20 MG capsule    Sig: Take 1 capsule (20 mg total) by mouth daily.    Dispense:  30 capsule    Refill:  3   hydrochlorothiazide (HYDRODIURIL) 25 MG tablet    Sig: Take 1 tablet (25 mg total) by mouth daily.    Dispense:  90 tablet    Refill:  2   Patient Instructions  See handout on foods to avoid that increase sodium.  Restart usual meds as prescribed.  I will refer you to cardiology and check some labs for meds and swelling.  Return to the clinic or go to the nearest emergency room if any of your symptoms worsen or new symptoms occur.  Peripheral Edema Peripheral edema is swelling that is caused by a buildup of fluid. Peripheral edema most often affects the lower legs, ankles, and feet. It can also develop in the arms, hands, and face. The area of the body that has peripheral edema will look swollen. It may also feel heavy or warm. Your clothes may start to feel tight. Pressing on the area may make a temporary dent in your skin. You may not be able to move your swollen arm or leg as much as usual. There are many causes of peripheral edema. It can happen because of a complication of other conditions such as congestive heart  failure, kidney disease, or a problem with your blood circulation. It also can be a side effect of certain medicines or because of an infection. It often happens to women during pregnancy. Sometimes, the cause is not known. Follow these instructions at home: Managing pain, stiffness, and swelling  Raise  (elevate) your legs while you are sitting or lying down. Move around often to prevent stiffness and to lessen swelling. Do not sit or stand for long periods of time. Wear support stockings as told by your health care provider. Medicines Take over-the-counter and prescription medicines only as told by your health care provider. Your health care provider may prescribe medicine to help your body get rid of excess water (diuretic). General instructions Pay attention to any changes in your symptoms. Follow instructions from your health care provider about limiting salt (sodium) in your diet. Sometimes, eating less salt may reduce swelling. Moisturize skin daily to help prevent skin from cracking and draining. Keep all follow-up visits as told by your health care provider. This is important. Contact a health care provider if you have: A fever. Edema that starts suddenly or is getting worse, especially if you are pregnant or have a medical condition. Swelling in only one leg. Increased swelling, redness, or pain in one or both of your legs. Drainage or sores at the area where you have edema. Get help right away if you: Develop shortness of breath, especially when you are lying down. Have pain in your chest or abdomen. Feel weak. Feel faint. Summary Peripheral edema is swelling that is caused by a buildup of fluid. Peripheral edema most often affects the lower legs, ankles, and feet. Move around often to prevent stiffness and to lessen swelling. Do not sit or stand for long periods of time. Pay attention to any changes in your symptoms. Contact a health care provider if you have edema that starts suddenly or is getting worse, especially if you are pregnant or have a medical condition. Get help right away if you develop shortness of breath, especially when lying down. This information is not intended to replace advice given to you by your health care provider. Make sure you discuss any questions  you have with your health care provider. Document Revised: 04/22/2021 Document Reviewed: 08/15/2018 Elsevier Patient Education  2022 [place],   Merri Ray, MD [place] 01/27/22 10:15 PM

## 2022-01-27 NOTE — Patient Instructions (Addendum)
See handout on foods to avoid that increase sodium.  Restart usual meds as prescribed.  I will refer you to cardiology and check some labs for meds and swelling.  Return to the clinic or go to the nearest emergency room if any of your symptoms worsen or new symptoms occur.  Peripheral Edema Peripheral edema is swelling that is caused by a buildup of fluid. Peripheral edema most often affects the lower legs, ankles, and feet. It can also develop in the arms, hands, and face. The area of the body that has peripheral edema will look swollen. It may also feel heavy or warm. Your clothes may start to feel tight. Pressing on the area may make a temporary dent in your skin. You may not be able to move your swollen arm or leg as much as usual. There are many causes of peripheral edema. It can happen because of a complication of other conditions such as congestive heart failure, kidney disease, or a problem with your blood circulation. It also can be a side effect of certain medicines or because of an infection. It often happens to women during pregnancy. Sometimes, the cause is not known. Follow these instructions at home: Managing pain, stiffness, and swelling  Raise (elevate) your legs while you are sitting or lying down. Move around often to prevent stiffness and to lessen swelling. Do not sit or stand for long periods of time. Wear support stockings as told by your health care provider. Medicines Take over-the-counter and prescription medicines only as told by your health care provider. Your health care provider may prescribe medicine to help your body get rid of excess water (diuretic). General instructions Pay attention to any changes in your symptoms. Follow instructions from your health care provider about limiting salt (sodium) in your diet. Sometimes, eating less salt may reduce swelling. Moisturize skin daily to help prevent skin from cracking and draining. Keep all follow-up visits as told  by your health care provider. This is important. Contact a health care provider if you have: A fever. Edema that starts suddenly or is getting worse, especially if you are pregnant or have a medical condition. Swelling in only one leg. Increased swelling, redness, or pain in one or both of your legs. Drainage or sores at the area where you have edema. Get help right away if you: Develop shortness of breath, especially when you are lying down. Have pain in your chest or abdomen. Feel weak. Feel faint. Summary Peripheral edema is swelling that is caused by a buildup of fluid. Peripheral edema most often affects the lower legs, ankles, and feet. Move around often to prevent stiffness and to lessen swelling. Do not sit or stand for long periods of time. Pay attention to any changes in your symptoms. Contact a health care provider if you have edema that starts suddenly or is getting worse, especially if you are pregnant or have a medical condition. Get help right away if you develop shortness of breath, especially when lying down. This information is not intended to replace advice given to you by your health care provider. Make sure you discuss any questions you have with your health care provider. Document Revised: 04/22/2021 Document Reviewed: 08/15/2018 Elsevier Patient Education  2022 [employer].

## 2022-01-27 NOTE — Telephone Encounter (Signed)
Called pt and discussed apparently insurance would like to be written for 2 pairs and then we can fax to [facility] at [address]

## 2022-01-28 NOTE — Telephone Encounter (Signed)
Pt came this morning. Picked up the new Rx that was printed and notes she is going to look into this with her insurance and let us know if theres anything further we can do.

## 2022-01-29 LAB — PRO B NATRIURETIC PEPTIDE: NT-Pro BNP: 52 pg/mL (ref 0–287)

## 2022-01-30 ENCOUNTER — Other Ambulatory Visit: Payer: Self-pay | Admitting: Family Medicine

## 2022-01-30 DIAGNOSIS — R7989 Other specified abnormal findings of blood chemistry: Secondary | ICD-10-CM

## 2022-02-01 NOTE — Telephone Encounter (Signed)
Patient called and states that she knows she is to wear compression stockings 10-20. However, she would like to know if she can wear the 8-15. Please advise.

## 2022-02-01 NOTE — Telephone Encounter (Signed)
Sure - 8-15 is fine - can advise pharmacy or medical supply of these options or new rx if needed.

## 2022-02-02 NOTE — Telephone Encounter (Signed)
Called pt back and informed her this was okay and she does not need a new prescription  ?

## 2022-02-10 ENCOUNTER — Ambulatory Visit: Payer: Self-pay | Admitting: Family Medicine

## 2022-02-11 ENCOUNTER — Ambulatory Visit (INDEPENDENT_AMBULATORY_CARE_PROVIDER_SITE_OTHER): Payer: 59 | Admitting: Family Medicine

## 2022-02-11 ENCOUNTER — Telehealth: Payer: Self-pay | Admitting: Family Medicine

## 2022-02-11 ENCOUNTER — Other Ambulatory Visit: Payer: Self-pay

## 2022-02-11 ENCOUNTER — Other Ambulatory Visit (HOSPITAL_BASED_OUTPATIENT_CLINIC_OR_DEPARTMENT_OTHER): Payer: Self-pay | Admitting: Family Medicine

## 2022-02-11 ENCOUNTER — Ambulatory Visit (HOSPITAL_BASED_OUTPATIENT_CLINIC_OR_DEPARTMENT_OTHER)
Admission: RE | Admit: 2022-02-11 | Discharge: 2022-02-11 | Disposition: A | Discharge status: Home / Self Care | Payer: 59 | Source: Ambulatory Visit | Attending: Family Medicine | Admitting: Family Medicine

## 2022-02-11 ENCOUNTER — Encounter: Payer: Self-pay | Admitting: Family Medicine

## 2022-02-11 VITALS — BP 130/78 | HR 66 | Temp 98.0°F | Resp 15 | Ht 63.0 in | Wt 207.0 lb

## 2022-02-11 DIAGNOSIS — M79661 Pain in right lower leg: Secondary | ICD-10-CM | POA: Insufficient documentation

## 2022-02-11 DIAGNOSIS — R609 Edema, unspecified: Secondary | ICD-10-CM | POA: Insufficient documentation

## 2022-02-11 DIAGNOSIS — E0590 Thyrotoxicosis, unspecified without thyrotoxic crisis or storm: Secondary | ICD-10-CM

## 2022-02-11 DIAGNOSIS — M79662 Pain in left lower leg: Secondary | ICD-10-CM | POA: Insufficient documentation

## 2022-02-11 DIAGNOSIS — Z1231 Encounter for screening mammogram for malignant neoplasm of breast: Secondary | ICD-10-CM

## 2022-02-11 MED ORDER — POTASSIUM CHLORIDE CRYS ER 20 MEQ PO TBCR: 20.0000 meq | EXTENDED_RELEASE_TABLET | Freq: Every day | ORAL | 0 refills | Status: DC

## 2022-02-11 MED ORDER — FUROSEMIDE 20 MG PO TABS: 20.0000 mg | ORAL_TABLET | Freq: Every day | ORAL | 0 refills | Status: DC

## 2022-02-11 NOTE — Telephone Encounter (Signed)
Pt would like an order for mammogram to go to the breast center  ?

## 2022-02-11 NOTE — Addendum Note (Signed)
Addended by: Merri Ray R on: 02/11/2022 10:15 AM ? ? Modules accepted: Orders ? ?

## 2022-02-11 NOTE — Progress Notes (Signed)
Subjective:  Patient ID: Frances Conley, female    DOB: [birth year]  Age: 61 y.o. MRN: [id number]  CC:  Chief Complaint  Patient presents with   Edema    Pt notes her legs are still swelling pretty bad and the Lt is painful today    Results    Discuss lab results     HPI Frances Conley presents for   Pedal edema, dyspnea on exertion: Follow-up from February 23 visit.  Prior ER visits.  Lower extremity Doppler negative for DVT in October, CT angio chest negative for PE at that time.  History of mild hyperthyroidism with small multinodular goiter and Graves' disease evaluated by endocrinology last year.  No new meds but recommended TSH monitoring 1-2 times per year and follow-up with new symptoms or TSH below 0.2.  Had reported intermittent worsening of swelling, sometimes better.  Had been eating processed meats and not taking HCTZ at her last visit.  Had missed a few days per week when she was taking it.  Reported shortness of breath with activity.  Pitting edema noted on exam to upper calves bilaterally.  Referred to cardiology, BNP obtained which was normal.  Updated TSH obtained, advised to decrease sodium intake and consistent use of HCTZ.  Compression stockings prescribed.  Cardiology attempted to call patient to schedule appointment but unable to leave message.  Notes from March 1.  BNP normal.  Vitamin D borderline low.  TSH was not obtained last visit.  We will check today. Has been using 10-98m compression stockings - daily except today. No diet changes since last visit. Trying to drink more water.  Still having lower extremity swelling - same as last visit, but better last week with elevation and compression stockingsome pain in outer left more than right calf today.  No new CP, no new dyspnea.  On 2000iu vit D gummies.  Taking HCTZ daily now, not yet today.   Results for orders placed or performed in visit on 01/27/22  Lipid panel  Result Value Ref Range   Cholesterol 192 0 - 200  mg/dL   Triglycerides 105.0 0.0 - 149.0 mg/dL   HDL 36.50 (L) >39.00 mg/dL   VLDL 21.0 0.0 - 40.0 mg/dL   LDL Cholesterol 135 (H) 0 - 99 mg/dL   Total CHOL/HDL Ratio 5    NonHDL 155.76   Comprehensive metabolic panel  Result Value Ref Range   Sodium 144 135 - 145 mEq/L   Potassium 3.8 3.5 - 5.1 mEq/L   Chloride 107 96 - 112 mEq/L   CO2 30 19 - 32 mEq/L   Glucose, Bld 78 70 - 99 mg/dL   BUN 11 6 - 23 mg/dL   Creatinine, Ser 0.86 0.40 - 1.20 mg/dL   Total Bilirubin 0.6 0.2 - 1.2 mg/dL   Alkaline Phosphatase 79 39 - 117 U/L   AST 21 0 - 37 U/L   ALT 24 0 - 35 U/L   Total Protein 6.9 6.0 - 8.3 g/dL   Albumin 4.1 3.5 - 5.2 g/dL   GFR 73.76 >60.00 mL/min   Calcium 8.7 8.4 - 10.5 mg/dL  VITAMIN D 25 Hydroxy (Vit-D Deficiency, Fractures)  Result Value Ref Range   VITD 25.13 (L) 30.00 - 100.00 ng/mL  Pro b natriuretic peptide  Result Value Ref Range   NT-Pro BNP 52 0 - 287 pg/mL  CBC  Result Value Ref Range   WBC 6.4 4.0 - 10.5 K/uL   RBC 4.78 3.87 -  5.11 Mil/uL   Platelets 188.0 150.0 - 400.0 K/uL   Hemoglobin 13.1 12.0 - 15.0 g/dL   HCT 39.7 36.0 - 46.0 %   MCV 83.1 78.0 - 100.0 fl   MCHC 32.9 30.0 - 36.0 g/dL   RDW 14.3 11.5 - 15.5 %     History Patient Active Problem List   Diagnosis Date Noted   Hyperthyroidism 12/19/2020   Past Medical History:  Diagnosis Date   Arthritis    both hips, hands, shoulders goes to Dr Gladstone Lighter, Jori Moll ortho md   [id number](194.1)    Hypercholesteremia    Hypertension    Ulcer    Past Surgical History:  Procedure Laterality Date   Alfalfa     No Known Allergies Prior to Admission medications   Medication Sig Start Date End Date Taking? Authorizing Provider  Cholecalciferol (VITAMIN D3) 50 MCG (2000 UT) CAPS Take by mouth.   Yes [provider]  Elastic Bandages & Supports (TRUFORM STOCKINGS [id number]) MISC 1 application by Does not apply route  daily. Low intensity compression stockings, knee-high.  Okay to substitute with different brand as needed for insurance coverage.  Dispense 2 pairs. 01/27/22  Yes Wendie Agreste, MD  hydrochlorothiazide (HYDRODIURIL) 25 MG tablet Take 1 tablet (25 mg total) by mouth daily. 01/27/22  Yes Wendie Agreste, MD  loratadine (CLARITIN) 10 MG tablet Take 1 tablet (10 mg total) by mouth daily. 10/21/20  Yes Wendie Agreste, MD  omeprazole (PRILOSEC) 20 MG capsule Take 1 capsule (20 mg total) by mouth daily. 01/27/22  Yes Wendie Agreste, MD  simvastatin (ZOCOR) 40 MG tablet Take 1 tablet (40 mg total) by mouth daily. 01/27/22  Yes Wendie Agreste, MD   Social History   Socioeconomic History   Marital status: Legally Separated    Spouse name: Not on file   Number of children: Not on file   Years of education: Not on file   Highest education level: Not on file  Occupational History   Not on file  Tobacco Use   Smoking status: Former    Packs/day: 0.50    Types: Cigarettes   Smokeless tobacco: Never  Vaping Use   Vaping Use: Never used  Substance and Sexual Activity   Alcohol use: No   Drug use: Never   Sexual activity: Not on file  Other Topics Concern   Not on file  Social History Narrative   Not on file   Social Determinants of Health   Financial Resource Strain: Not on file  Food Insecurity: Not on file  Transportation Needs: Not on file  Physical Activity: Not on file  Stress: Not on file  Social Connections: Not on file  Intimate Partner Violence: Not on file    Review of Systems Per HPI   Objective:   Vitals:   02/11/22 0906  BP: 130/78  Pulse: 66  Resp: 15  Temp: 98 F (36.7 C)  TempSrc: Temporal  SpO2: 98%  Weight: 207 lb (93.9 kg)  Height: '5\' 3"'$  (1.6 m)   Physical Exam Vitals reviewed.  Constitutional:      Appearance: Normal appearance. She is well-developed.  HENT:     Head: Normocephalic and atraumatic.  Eyes:     Conjunctiva/sclera:  Conjunctivae normal.     Pupils: Pupils are equal, round, and reactive to light.  Neck:     Vascular:  No carotid bruit.  Cardiovascular:     Rate and Rhythm: Normal rate and regular rhythm.     Heart sounds: Normal heart sounds.  Pulmonary:     Effort: Pulmonary effort is normal. No respiratory distress.     Breath sounds: Normal breath sounds. No stridor. No wheezing, rhonchi or rales.  Abdominal:     Palpations: Abdomen is soft. There is no pulsatile mass.     Tenderness: There is no abdominal tenderness.  Musculoskeletal:     Right lower leg: Edema (Tight pitting edema right and left lower extremity with slight discomfort of the lateral calf bilaterally, negative Homans.) present.     Left lower leg: Edema present.  Skin:    General: Skin is warm and dry.     Findings: No rash.     Comments: Skin intact, no wounds of lower extremities.  Neurological:     Mental Status: She is alert and oriented to person, place, and time.  Psychiatric:        Mood and Affect: Mood normal.        Behavior: Behavior normal.     Assessment & Plan:  Frances Conley is a 61 y.o. female . Hyperthyroidism - Plan: TSH + free T4  Peripheral edema - Plan: potassium chloride SA (KLOR-CON M) 20 MEQ tablet, furosemide (LASIX) 20 MG tablet  Bilateral calf pain - Plan: VAS [place] LOWER EXTREMITY VENOUS (DVT)  Encouraged by home improvement with compression stockings, elevation. Tight edema this am.  New left calf pain, right calf pain on exam.  Unlikely DVT but will check ultrasound.  Consistent use of compression stockings discussed, elevation, consistent HCTZ dosing.  Add furosemide for 2 days with potassium.  Potential side effects discussed.  Encouraged decrease sodium intake, and cardiology follow-up.  Previous BMP and lung exam reassuring today.  ER/RTC precautions, recheck 2 weeks.  Check TSH, free T4 with history of hyperthyroidism as we did not get that lab last visit.   Meds ordered this encounter   Medications   potassium chloride SA (KLOR-CON M) 20 MEQ tablet    Sig: Take 1 tablet (20 mEq total) by mouth daily for 2 doses.    Dispense:  2 tablet    Refill:  0   furosemide (LASIX) 20 MG tablet    Sig: Take 1 tablet (20 mg total) by mouth daily for 2 doses.    Dispense:  2 tablet    Refill:  0   Patient Instructions  Try to cut back on high sodium foods.  Call cardiology for appointment - they were unable to leave a message.  Continue compression stockings and consistent use of hydrochlorothiazide which should help with leg swelling. I will check ultrasound of leg and thyroid test.  Furosemide for the next 2 days with potassium should help get swelling down further. Recheck next 2 weeks, sooner if any new or worsening symptoms.  Please let me know if you have questions.    Signed,   Merri Ray, MD [facility] 02/11/22 9:40 AM

## 2022-02-11 NOTE — Telephone Encounter (Signed)
Called to discuss as pt is already scheduled for next Friday 02/18/2022 at 10 for mammo I would advise against changing as this can delay exam  ?

## 2022-02-11 NOTE — Patient Instructions (Addendum)
Try to cut back on high sodium foods.  ?Call cardiology for appointment - they were unable to leave a message.  ?Continue compression stockings and consistent use of hydrochlorothiazide which should help with leg swelling. ?I will check ultrasound of leg and thyroid test.  ?Furosemide for the next 2 days with potassium should help get swelling down further. ?Recheck next 2 weeks, sooner if any new or worsening symptoms.  Please let me know if you have questions.  Reports some improvement in edema with elevation, compression stockings, some improvement with compression stockings, elevation by history but tight edema noted this morning.  By improvement at home with elevation, compression stockings.  I ?

## 2022-02-12 LAB — TSH+FREE T4: TSH W/REFLEX TO FT4: 0.71 mIU/L (ref 0.40–4.50)

## 2022-02-16 ENCOUNTER — Telehealth: Payer: Self-pay

## 2022-02-16 NOTE — Telephone Encounter (Signed)
-----   Message from Wendie Agreste, MD sent at 02/11/2022  4:01 PM EST ----- ?Call patient.  Good news.  No sign of blood clot in either extremity.  Let me know if there are questions. ?

## 2022-02-16 NOTE — Telephone Encounter (Signed)
LM to discuss lab results  and [place] results : ? ? ?Good news, thyroid tests were normal.  Let me know if you have questions. ?

## 2022-02-16 NOTE — Telephone Encounter (Signed)
Pt is calling back about labs  ?

## 2022-02-16 NOTE — Telephone Encounter (Signed)
LVM for patient to return call about [place] results ?

## 2022-02-16 NOTE — Telephone Encounter (Signed)
Pt is returning call.  
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
## 2022-02-18 ENCOUNTER — Ambulatory Visit (HOSPITAL_BASED_OUTPATIENT_CLINIC_OR_DEPARTMENT_OTHER)
Admission: RE | Admit: 2022-02-18 | Discharge: 2022-02-18 | Disposition: A | Discharge status: Home / Self Care | Payer: 59 | Source: Ambulatory Visit | Attending: Family Medicine | Admitting: Family Medicine

## 2022-02-18 ENCOUNTER — Other Ambulatory Visit: Payer: Self-pay

## 2022-02-18 ENCOUNTER — Encounter (HOSPITAL_BASED_OUTPATIENT_CLINIC_OR_DEPARTMENT_OTHER): Payer: Self-pay | Admitting: Radiology

## 2022-02-18 DIAGNOSIS — Z1231 Encounter for screening mammogram for malignant neoplasm of breast: Secondary | ICD-10-CM | POA: Diagnosis present

## 2022-04-14 DIAGNOSIS — R0609 Other forms of dyspnea: Secondary | ICD-10-CM | POA: Insufficient documentation

## 2022-04-14 NOTE — Progress Notes (Addendum)
Cardiology Office Note   Date:  04/15/2022   ID:  Frances Conley, DOB 1961/07/04, MRN [id number]  PCP:  Wendie Agreste, MD  Cardiologist:   None Referring:  Wendie Agreste, MD  Chief Complaint  Patient presents with   Shortness of Breath   Edema      History of Present Illness: Frances Conley is a 61 y.o. female who presents for evaluation of DOE.  She says she has been getting short of breath for years but has been getting worse.  She describes shortness of breath climbing a flight of stairs.  She feels short of breath doing some of the household chores.  She is not necessarily describing PND or orthopnea.  She has not been having new palpitations, presyncope or syncope.  She did gain 20 pounds in 3 months.  She has had increased lower extremity swelling.  I do see that she was in the emergency room in October because of this.  She had a CT negative for pulmonary embolism.  She had an ultrasound of her calves demonstrating no DVT.  She was in the emergency room again in January.  BNP was normal.  Chest x-ray demonstrated no edema.  She did have another Doppler done as an outpatient in March again without evidence of DVT.  She was given a couple of days of Lasix by her primary provider and she did have some improvement with this.  She does not watch her salt necessarily.  She is not describing chest pressure, neck or arm discomfort.      Past Medical History:  Diagnosis Date   Arthritis    both hips, hands, shoulders goes to Dr Gladstone Lighter, Jori Moll ortho md   [id number](062.3)    Hypercholesteremia    Hypertension    Ulcer     Past Surgical History:  Procedure Laterality Date   [place]     UTERINE FIBROID SURGERY       Current Outpatient Medications  Medication Sig Dispense Refill   Elastic Bandages & Supports (TRUFORM STOCKINGS [id number]) MISC 1 application by Does not apply route daily. Low intensity compression stockings,  knee-high.  Okay to substitute with different brand as needed for insurance coverage.  Dispense 2 pairs. 2 each 0   furosemide (LASIX) 40 MG tablet Take 1 tablet (40 mg total) by mouth daily. 90 tablet 3   hydrochlorothiazide (HYDRODIURIL) 25 MG tablet Take 1 tablet (25 mg total) by mouth daily. 90 tablet 2   omeprazole (PRILOSEC) 20 MG capsule Take 1 capsule (20 mg total) by mouth daily. 30 capsule 3   potassium chloride (KLOR-CON) 10 MEQ tablet Take 2 tablets (20 mEq total) by mouth daily. 180 tablet 3   simvastatin (ZOCOR) 40 MG tablet Take 1 tablet (40 mg total) by mouth daily. 90 tablet 1   Cholecalciferol (VITAMIN D3) 50 MCG (2000 UT) CAPS Take by mouth. (Patient not taking: Reported on 04/15/2022)     No current facility-administered medications for this visit.    Allergies:   Patient has no known allergies.    Social History:  The patient  reports that she has quit smoking. Her smoking use included cigarettes. She smoked an average of .5 packs per day. She has never used smokeless tobacco. She reports that she does not drink alcohol and does not use drugs.   Family History:  The patient's family history includes Cancer in her mother;  Chronic Renal Failure in her father; Diabetes in her father, mother, and sister; Heart failure in her brother, father, mother, and sister; Hyperlipidemia in her father and mother; Hypertension in her father and mother; Kidney disease in her father; Thyroid disease in her mother.    ROS:  Please see the history of present illness.   Otherwise, review of systems are positive for none.   All other systems are reviewed and negative.    PHYSICAL EXAM: VS:  BP 120/90 (BP Location: Left Arm, Patient Position: Sitting, Cuff Size: Large)   Pulse 72   Ht '5\' 3"'$  (1.6 m)   Wt 202 lb (91.6 kg)   BMI 35.78 kg/m  , BMI Body mass index is 35.78 kg/m. GENERAL:  Well appearing HEENT:  Pupils equal round and reactive, fundi not visualized, oral mucosa  unremarkable NECK:  No jugular venous distention, waveform within normal limits, carotid upstroke brisk and symmetric, no bruits, no thyromegaly LYMPHATICS:  No cervical, inguinal adenopathy LUNGS:  Clear to auscultation bilaterally BACK:  No CVA tenderness CHEST:  Unremarkable HEART:  PMI not displaced or sustained,S1 and S2 within normal limits, no S3, no S4, no clicks, no rubs, no murmurs ABD:  Flat, positive bowel sounds normal in frequency in pitch, no bruits, no rebound, no guarding, no midline pulsatile mass, no hepatomegaly, no splenomegaly EXT:  2 plus pulses throughout, mild bilateral leg edema, no cyanosis no clubbing SKIN:  No rashes no nodules NEURO:  Cranial nerves II through XII grossly intact, motor grossly intact throughout PSYCH:  Cognitively intact, oriented to person place and time    EKG:  EKG is not ordered today. The ekg ordered 12/08/2021 demonstrates sinus rhythm, rate 61, axis within normal limits, intervals within normal limits, no acute ST-T wave changes.   Recent Labs: 09/16/2021: Magnesium 1.8 12/08/2021: B Natriuretic Peptide 66.0 01/27/2022: ALT 24; BUN 11; Creatinine, Ser 0.86; Hemoglobin 13.1; NT-Pro BNP 52; Platelets 188.0; Potassium 3.8; Sodium 144    Lipid Panel    Component Value Date/Time   CHOL 192 01/27/2022 1230   CHOL 186 09/18/2020 1421   TRIG 105.0 01/27/2022 1230   HDL 36.50 (L) 01/27/2022 1230   HDL 35 (L) 09/18/2020 1421   CHOLHDL 5 01/27/2022 1230   VLDL 21.0 01/27/2022 1230   LDLCALC 135 (H) 01/27/2022 1230   LDLCALC 131 (H) 09/18/2020 1421      Wt Readings from Last 3 Encounters:  04/15/22 202 lb (91.6 kg)  02/11/22 207 lb (93.9 kg)  01/27/22 209 lb 6.4 oz (95 kg)      Other studies Reviewed: Additional studies/ records that were reviewed today include: ED records. Review of the above records demonstrates:  Please see elsewhere in the note.     ASSESSMENT AND PLAN:  DOE: I am going to start with an echocardiogram.  I  am going to go ahead and give her Lasix 40 mg daily and potassium 20 mill equivalents daily.  She will get a basic metabolic profile today.  She will get a basic metabolic profile again in another week.  We talked about salt restriction and examined restriction.  She can wear elastic compression stockings.  The next step will be based on the results of this.  She might need CT versus perfusion imaging.  EDEMA: This will be managed as above.   Current medicines are reviewed at length with the patient today.  The patient does not have concerns regarding medicines.  The following changes have been made: As above  Labs/ tests ordered today include:   Orders Placed This Encounter  Procedures   Basic Metabolic Panel (BMET)   Basic Metabolic Panel (BMET)   EKG 12-Lead   ECHOCARDIOGRAM COMPLETE     Disposition:   FU with me after the echocardiogram   Signed, Minus Breeding, MD  04/15/2022 2:43 PM    [place]

## 2022-04-15 ENCOUNTER — Encounter: Payer: Self-pay | Admitting: Cardiology

## 2022-04-15 ENCOUNTER — Ambulatory Visit: Payer: 59 | Admitting: Cardiology

## 2022-04-15 VITALS — BP 120/90 | HR 72 | Ht 63.0 in | Wt 202.0 lb

## 2022-04-15 DIAGNOSIS — R0609 Other forms of dyspnea: Secondary | ICD-10-CM | POA: Diagnosis not present

## 2022-04-15 MED ORDER — POTASSIUM CHLORIDE ER 10 MEQ PO TBCR: 20.0000 meq | EXTENDED_RELEASE_TABLET | Freq: Every day | ORAL | 3 refills | Status: DC

## 2022-04-15 MED ORDER — FUROSEMIDE 40 MG PO TABS: 40.0000 mg | ORAL_TABLET | Freq: Every day | ORAL | 3 refills | Status: DC

## 2022-04-15 NOTE — Patient Instructions (Signed)
Medication Instructions:  ? ?START FUROSEMIDE 40 MG ONCE DAILY ? ?START POTASSIUM CHLORIDE 20 MEQ ONCE DAILY=2 OF THE 10 MEQ TABLETS ONCE DAILY ? ?*If you need a refill on your cardiac medications before your next appointment, please call your pharmacy* ? ? ?Lab Work: ? ?Your physician recommends that you return for lab work in: ONE WEEK-DO NOT NEED TO FAST ? ?If you have labs (blood work) drawn today and your tests are completely normal, you will receive your results only by: ?MyChart Message (if you have MyChart) OR ?A paper copy in the mail ?If you have any lab test that is abnormal or we need to change your treatment, we will call you to review the results. ? ? ?Testing/Procedures: ? ?Your physician has requested that you have an echocardiogram. Echocardiography is a painless test that uses sound waves to create images of your heart. It provides your doctor with information about the size and shape of your heart and how well your heart?s chambers and valves are working. This procedure takes approximately one hour. There are no restrictions for this procedure. [place] ?Follow-Up: ?At [facility], you and your health needs are our priority.  As part of our continuing mission to provide you with exceptional heart care, we have created designated Provider Care Teams.  These Care Teams include your primary Cardiologist (physician) and Advanced Practice Providers (APPs -  Physician Assistants and Nurse Practitioners) who all work together to provide you with the care you need, when you need it. ? ?We recommend signing up for the patient portal called "MyChart".  Sign up information is provided on this After Visit Summary.  MyChart is used to connect with patients for Virtual Visits (Telemedicine).  Patients are able to view lab/test results, encounter notes, upcoming appointments, etc.  Non-urgent messages can be sent to your provider as well.   ?To learn more about what you can do with  MyChart, go to [email or web address].   ? ?Your next appointment:   ? ?AFTER ECHO COMPLETE  ? ?The format for your next appointment:   ?In Person ? ?Provider:   ?Minus Breeding MD  ? ? ? ? ?Important Information About Sugar ? ? ? ? ?  ?

## 2022-04-16 LAB — BASIC METABOLIC PANEL
BUN/Creatinine Ratio: 10 — ABNORMAL LOW (ref 12–28)
BUN: 9 mg/dL (ref 8–27)
CO2: 25 mmol/L (ref 20–29)
Calcium: 9 mg/dL (ref 8.7–10.3)
Chloride: 104 mmol/L (ref 96–106)
Creatinine, Ser: 0.87 mg/dL (ref 0.57–1.00)
Glucose: 77 mg/dL (ref 70–99)
Potassium: 3.3 mmol/L — ABNORMAL LOW (ref 3.5–5.2)
Sodium: 145 mmol/L — ABNORMAL HIGH (ref 134–144)
eGFR: 76 mL/min/{1.73_m2} (ref >59)

## 2022-04-27 ENCOUNTER — Encounter: Payer: Self-pay | Admitting: *Deleted

## 2022-05-03 ENCOUNTER — Ambulatory Visit (HOSPITAL_COMMUNITY): Payer: 59 | Attending: Cardiovascular Disease

## 2022-05-03 DIAGNOSIS — R0609 Other forms of dyspnea: Secondary | ICD-10-CM | POA: Diagnosis not present

## 2022-05-03 LAB — ECHOCARDIOGRAM COMPLETE
Area-P 1/2: 4.06 cm2
S' Lateral: 2.8 cm

## 2022-05-05 DIAGNOSIS — M7989 Other specified soft tissue disorders: Secondary | ICD-10-CM | POA: Insufficient documentation

## 2022-05-05 DIAGNOSIS — I517 Cardiomegaly: Secondary | ICD-10-CM | POA: Insufficient documentation

## 2022-05-05 NOTE — Progress Notes (Unsigned)
Cardiology Office Note   Date:  05/06/2022   ID:  Frances Conley, DOB July 07, 1961, MRN [id number]  PCP:  Wendie Agreste, MD  Cardiologist:   None Referring:  Wendie Agreste, MD  Chief Complaint  Patient presents with   Shortness of Breath      History of Present Illness: Frances Conley is a 60 y.o. female who presents for evaluation of DOE.  She had mild/mod LVH on echo.  I started her on Lasix.  She forgets to take she is a Lasix periodically but she does think it helped with some lower extremity swelling and some shortness of breath.  She says she is having trouble with her memory which is why she forgets to take it.  She is stressed because she takes care of 73-year-old grandsons and a 103-year-old grandson all day while her daughter is at work.  She is not describing chest pressure, neck or arm discomfort.  However, she has been having the increased shortness of breath previously described walking moderate distance on level ground or up stairs.  She is not describing PND or orthopnea.  She is not having any new palpitations, presyncope or syncope.  She is not having any chest pressure, neck or arm discomfort.   Past Medical History:  Diagnosis Date   Arthritis    both hips, hands, shoulders goes to Dr Gladstone Lighter, Jori Moll ortho md   [id number](751.7)    Hypercholesteremia    Hypertension    Ulcer     Past Surgical History:  Procedure Laterality Date   [place]   TUBAL LIGATION     UTERINE FIBROID SURGERY       Current Outpatient Medications  Medication Sig Dispense Refill   furosemide (LASIX) 40 MG tablet Take 1 tablet (40 mg total) by mouth daily. 90 tablet 3   hydrochlorothiazide (HYDRODIURIL) 25 MG tablet Take 1 tablet (25 mg total) by mouth daily. 90 tablet 2   omeprazole (PRILOSEC) 20 MG capsule Take 1 capsule (20 mg total) by mouth daily. 30 capsule 3   potassium chloride (KLOR-CON) 10 MEQ tablet Take 2 tablets (20 mEq total) by mouth  daily. 180 tablet 3   simvastatin (ZOCOR) 40 MG tablet Take 1 tablet (40 mg total) by mouth daily. 90 tablet 1   Cholecalciferol (VITAMIN D3) 50 MCG (2000 UT) CAPS Take by mouth. (Patient not taking: Reported on 05/06/2022)     Elastic Bandages & Supports (TRUFORM STOCKINGS [id number]) MISC 1 application by Does not apply route daily. Low intensity compression stockings, knee-high.  Okay to substitute with different brand as needed for insurance coverage.  Dispense 2 pairs. (Patient not taking: Reported on 05/06/2022) 2 each 0   No current facility-administered medications for this visit.    Allergies:   Patient has no known allergies.    ROS:  Please see the history of present illness.   Otherwise, review of systems are positive for none.   All other systems are reviewed and negative.    PHYSICAL EXAM: VS:  BP 118/76 (BP Location: Left Arm, Patient Position: Sitting, Cuff Size: Large)   Pulse (!) 56   Ht '5\' 3"'$  (1.6 m)   Wt 204 lb 12.8 oz (92.9 kg)   SpO2 97%   BMI 36.28 kg/m  , BMI Body mass index is 36.28 kg/m. GENERAL:  Well appearing NECK:  No jugular venous distention, waveform within normal limits, carotid upstroke brisk and symmetric, no  bruits, no thyromegaly LUNGS:  Clear to auscultation bilaterally CHEST:  Unremarkable HEART:  PMI not displaced or sustained,S1 and S2 within normal limits, no S3, no S4, no clicks, no rubs, no murmurs ABD:  Flat, positive bowel sounds normal in frequency in pitch, no bruits, no rebound, no guarding, no midline pulsatile mass, no hepatomegaly, no splenomegaly EXT:  2 plus pulses throughout, mild edema, no cyanosis no clubbing   EKG:  EKG is  ordered today. The ekg ordered today demonstrates sinus rhythm, rate 56, axis within normal limits, intervals within normal limits, no acute ST-T wave changes.   Recent Labs: 09/16/2021: Magnesium 1.8 12/08/2021: B Natriuretic Peptide 66.0 01/27/2022: ALT 24; Hemoglobin 13.1; NT-Pro BNP 52; Platelets  188.0 04/15/2022: BUN 9; Creatinine, Ser 0.87; Potassium 3.3; Sodium 145    Lipid Panel    Component Value Date/Time   CHOL 192 01/27/2022 1230   CHOL 186 09/18/2020 1421   TRIG 105.0 01/27/2022 1230   HDL 36.50 (L) 01/27/2022 1230   HDL 35 (L) 09/18/2020 1421   CHOLHDL 5 01/27/2022 1230   VLDL 21.0 01/27/2022 1230   LDLCALC 135 (H) 01/27/2022 1230   LDLCALC 131 (H) 09/18/2020 1421      Wt Readings from Last 3 Encounters:  05/06/22 204 lb 12.8 oz (92.9 kg)  04/15/22 202 lb (91.6 kg)  02/11/22 207 lb (93.9 kg)      Other studies Reviewed: Additional studies/ records that were reviewed today include: Labs Review of the above records demonstrates:  Please see elsewhere in the note.     ASSESSMENT AND PLAN:  DOE: She is going to continue the Lasix.  She did have some mild LVH and I will be following up with an echo next year to make sure this has not progressed.  She did have some uncontrolled hypertension in the past that may have contributed but I will follow this.  I do not have a strong suspicion of any infiltrative disease.  Because I am not sure that this is the etiology of her shortness of breath I would like to also rule out ischemia .  I have suggested a PET scan but she did not want to have this.  She thinks she will be able to walk on a treadmill and so I will try a  POET (Plain Old Exercise Treadmill) I think it is a low pretest probability of obstructive coronary disease.    EDEMA:    She can continue the Lasix.  I will check a basic metabolic profile today.  We talked about salt and fluid restriction.  Current medicines are reviewed at length with the patient today.  The patient does not have concerns regarding medicines.  The following changes have been made: As above  Labs/ tests ordered today include:   No orders of the defined types were placed in this encounter.    Disposition:   FU with me next year after the echo.    Signed, Minus Breeding, MD   05/06/2022 11:49 AM    [facility]

## 2022-05-06 ENCOUNTER — Ambulatory Visit (INDEPENDENT_AMBULATORY_CARE_PROVIDER_SITE_OTHER): Payer: 59 | Admitting: Cardiology

## 2022-05-06 ENCOUNTER — Encounter: Payer: Self-pay | Admitting: Cardiology

## 2022-05-06 VITALS — BP 118/76 | HR 56 | Ht 63.0 in | Wt 204.8 lb

## 2022-05-06 DIAGNOSIS — R0609 Other forms of dyspnea: Secondary | ICD-10-CM

## 2022-05-06 DIAGNOSIS — I517 Cardiomegaly: Secondary | ICD-10-CM

## 2022-05-06 DIAGNOSIS — M7989 Other specified soft tissue disorders: Secondary | ICD-10-CM | POA: Diagnosis not present

## 2022-05-06 NOTE — Patient Instructions (Addendum)
Medication Instructions:  Your physician recommends that you continue on your current medications as directed. Please refer to the Current Medication list given to you today.  *If you need a refill on your cardiac medications before your next appointment, please call your pharmacy*   Lab Work: Your physician recommends that you have labs drawn today: BMET  If you have labs (blood work) drawn today and your tests are completely normal, you will receive your results only by: [place] (if you have MyChart) OR A paper copy in the mail If you have any lab test that is abnormal or we need to change your treatment, we will call you to review the results.   Testing/Procedures: Your physician has requested that you have an exercise tolerance test. For further information please visit [email or web address]. Please also follow instruction sheet, as given. This will take place at [place], [address]. Do not drink or eat foods with caffeine for 24 hours before the test. (Chocolate, coffee, tea, or energy drinks) If you use an inhaler, bring it with you to the test. Do not smoke for 4 hours before the test. Wear comfortable shoes and clothing.     Your physician has requested that you have an echocardiogram. Echocardiography is a painless test that uses sound waves to create images of your heart. It provides your doctor with information about the size and shape of your heart and how well your heart's chambers and valves are working. This procedure takes approximately one hour. There are no restrictions for this procedure. To be done in May 2024. This procedure will be done at [address]    Follow-Up: At [facility], you and your health needs are our priority.  As part of our continuing mission to provide you with exceptional heart care, we have created designated Provider Care Teams.  These Care Teams include your primary Cardiologist (physician) and Advanced Practice  Providers (APPs -  Physician Assistants and Nurse Practitioners) who all work together to provide you with the care you need, when you need it.  We recommend signing up for the patient portal called "MyChart".  Sign up information is provided on this After Visit Summary.  MyChart is used to connect with patients for Virtual Visits (Telemedicine).  Patients are able to view lab/test results, encounter notes, upcoming appointments, etc.  Non-urgent messages can be sent to your provider as well.   To learn more about what you can do with MyChart, go to [email or web address].    Your next appointment:   12 month(s)  The format for your next appointment:   In Person  Provider:   Minus Breeding, MD

## 2022-05-07 LAB — BASIC METABOLIC PANEL
BUN/Creatinine Ratio: 14 (ref 12–28)
BUN: 12 mg/dL (ref 8–27)
CO2: 24 mmol/L (ref 20–29)
Calcium: 9.2 mg/dL (ref 8.7–10.3)
Chloride: 105 mmol/L (ref 96–106)
Creatinine, Ser: 0.84 mg/dL (ref 0.57–1.00)
Glucose: 102 mg/dL — ABNORMAL HIGH (ref 70–99)
Potassium: 4.4 mmol/L (ref 3.5–5.2)
Sodium: 143 mmol/L (ref 134–144)
eGFR: 80 mL/min/{1.73_m2} (ref >59)

## 2022-05-11 ENCOUNTER — Telehealth: Payer: Self-pay | Admitting: Cardiology

## 2022-05-11 ENCOUNTER — Telehealth: Payer: Self-pay | Admitting: Physician Assistant

## 2022-05-11 ENCOUNTER — Telehealth (HOSPITAL_COMMUNITY): Payer: Self-pay | Admitting: *Deleted

## 2022-05-11 NOTE — Telephone Encounter (Signed)
Patient is calling back. She states she listened to the voicemail with instructions for stress test tomorrow and she wanted to confirm whether or not she will need to fast/withhold caffeine. She states she received instructions previously and was told to fast, but in voice message from today she was told she doesn't need to fast. Please advise ASAP as stress test is scheduled for tomorrow afternoon, 6/08.

## 2022-05-11 NOTE — Telephone Encounter (Signed)
Close encounter 

## 2022-05-11 NOTE — Telephone Encounter (Signed)
Paged by answering service.  He is calling back to see if she can drink caffeine prior to her stress test or not.  It is scheduled in afternoon at 3 PM. I have advised that she does not need to be fasting.  She will call us after 8 AM to get a caffeine instruction per protocol. Advised to continue to take her medications.  She is not on any rate control agent.

## 2022-05-11 NOTE — Telephone Encounter (Signed)
LMTCB

## 2022-05-12 ENCOUNTER — Encounter (HOSPITAL_COMMUNITY): Payer: Self-pay

## 2022-05-12 ENCOUNTER — Encounter: Payer: Self-pay | Admitting: *Deleted

## 2022-05-12 ENCOUNTER — Inpatient Hospital Stay (HOSPITAL_COMMUNITY): Admission: RE | Admit: 2022-05-12 | Payer: 59 | Source: Ambulatory Visit

## 2022-06-29 ENCOUNTER — Telehealth: Payer: Self-pay | Admitting: Family Medicine

## 2022-06-29 NOTE — Telephone Encounter (Signed)
Caller name: Myda (pt)  On DPR? :yes/no: Yes  Call back number: [phone]  Provider they see: Dr. Carlota Raspberry  Reason for call: Pt would like something to help with coughing.  Would like to speak to [facility].  States that she is having trouble having a productive cough.

## 2022-06-30 NOTE — Telephone Encounter (Signed)
Please schedule an appointment pt has not been seen since March will require and appointment

## 2022-07-04 ENCOUNTER — Ambulatory Visit (INDEPENDENT_AMBULATORY_CARE_PROVIDER_SITE_OTHER): Payer: 59 | Admitting: Family Medicine

## 2022-07-04 VITALS — BP 118/76 | HR 73 | Temp 98.3°F | Resp 16 | Ht 63.0 in | Wt 196.8 lb

## 2022-07-04 DIAGNOSIS — R0989 Other specified symptoms and signs involving the circulatory and respiratory systems: Secondary | ICD-10-CM

## 2022-07-04 DIAGNOSIS — R051 Acute cough: Secondary | ICD-10-CM | POA: Diagnosis not present

## 2022-07-04 MED ORDER — AZITHROMYCIN 250 MG PO TABS: ORAL_TABLET | ORAL | 0 refills | Status: AC

## 2022-07-04 MED ORDER — BENZONATATE 100 MG PO CAPS: 100.0000 mg | ORAL_CAPSULE | Freq: Three times a day (TID) | ORAL | 0 refills | Status: DC | PRN

## 2022-07-04 NOTE — Patient Instructions (Signed)
Okay to try Mucinex over-the-counter as that can help reduce the mucus and help clear the chest congestion.  Tessalon Perles were prescribed if needed up to 3 times per day for cough if the Mucinex is not helping on its own.  If cough including the discolored mucus is not improving in the next few days, start azithromycin antibiotic.  Hope you feel better soon!   Return to the clinic or go to the nearest emergency room if any of your symptoms worsen or new symptoms occur.  Cough, Adult Coughing is a reflex that clears your throat and your airways (respiratory system). Coughing helps to heal and protect your lungs. It is normal to cough occasionally, but a cough that happens with other symptoms or lasts a long time may be a sign of a condition that needs treatment. An acute cough may only last 2-3 weeks, while a chronic cough may last 8 or more weeks. Coughing is commonly caused by: Infection of the respiratory systemby viruses or bacteria. Breathing in substances that irritate your lungs. Allergies. Asthma. Mucus that runs down the back of your throat (postnasal drip). Smoking. Acid backing up from the stomach into the esophagus (gastroesophageal reflux). Certain medicines. Chronic lung problems. Other medical conditions such as heart failure or a blood clot in the lung (pulmonary embolism). Follow these instructions at home: Medicines Take over-the-counter and prescription medicines only as told by your health care provider. Talk with your health care provider before you take a cough suppressant medicine. Lifestyle  Avoid cigarette smoke. Do not use any products that contain nicotine or tobacco, such as cigarettes, e-cigarettes, and chewing tobacco. If you need help quitting, ask your health care provider. Drink enough fluid to keep your urine pale yellow. Avoid caffeine. Do not drink alcohol if your health care provider tells you not to drink. General instructions  Pay close attention  to changes in your cough. Tell your health care provider about them. Always cover your mouth when you cough. Avoid things that make you cough, such as perfume, candles, cleaning products, or campfire or tobacco smoke. If the air is dry, use a cool mist vaporizer or humidifier in your bedroom or your home to help loosen secretions. If your cough is worse at night, try to sleep in a semi-upright position. Rest as needed. Keep all follow-up visits as told by your health care provider. This is important. Contact a health care provider if you: Have new symptoms. Cough up pus. Have a cough that does not get better after 2-3 weeks or gets worse. Cannot control your cough with cough suppressant medicines and you are losing sleep. Have pain that gets worse or pain that is not helped with medicine. Have a fever. Have unexplained weight loss. Have night sweats. Get help right away if: You cough up blood. You have difficulty breathing. Your heartbeat is very fast. These symptoms may represent a serious problem that is an emergency. Do not wait to see if the symptoms will go away. Get medical help right away. Call your local emergency services (911 in the [place] Do not drive yourself to the hospital. Summary Coughing is a reflex that clears your throat and your airways. It is normal to cough occasionally, but a cough that happens with other symptoms or lasts a long time may be a sign of a condition that needs treatment. Take over-the-counter and prescription medicines only as told by your health care provider. Always cover your mouth when you cough. Contact a health care  provider if you have new symptoms or a cough that does not get better after 2-3 weeks or gets worse. This information is not intended to replace advice given to you by your health care provider. Make sure you discuss any questions you have with your health care provider. Document Revised: 12/10/2018 Document Reviewed:  12/10/2018 Elsevier Patient Education  [place].

## 2022-07-04 NOTE — Progress Notes (Unsigned)
Subjective:  Patient ID: Frances Conley, female    DOB: [birth year]  Age: 61 y.o. MRN: [id number]  CC:  Chief Complaint  Patient presents with   Cough    Pt reports coughing fits, will get SOB from fits, has been worst at night, has been present apx 1 week, no other sxs     HPI [name] presents for   Cough: Started 1 week ago. Some chest congestion, initially nonproductive, now yellow phlegm. Hot/cod feeling first few days only - did not check temp. Slight HA initially.  Initial day of symptoms about 7/21.  [name] was sick recently with runny nose and cough.  No covid testing.  Tx: none.  No chest pain - only sore with cough, no new dyspnea.  Cut back on soda - losing some weight. Not swelling as in past. Treated with lasix. EF 60-65% 05/03/22.    Immunization History  Administered Date(s) Administered   PPD Test 04/27/2016  Did not receive covid vaccine.        History Patient Active Problem List   Diagnosis Date Noted   Leg swelling 05/05/2022   LVH (left ventricular hypertrophy) 05/05/2022   DOE (dyspnea on exertion) 04/14/2022   Hyperthyroidism 12/19/2020   Past Medical History:  Diagnosis Date   Arthritis    both hips, hands, shoulders goes to Dr Gladstone Lighter, Jori Moll ortho md   EHMCNOBS(962.8)    Hypercholesteremia    Hypertension    Ulcer    Past Surgical History:  Procedure Laterality Date   [place]     No Known Allergies Prior to Admission medications   Medication Sig Start Date End Date Taking? Authorizing Provider  furosemide (LASIX) 40 MG tablet Take 1 tablet (40 mg total) by mouth daily. 04/15/22  Yes Minus Breeding, MD  hydrochlorothiazide (HYDRODIURIL) 25 MG tablet Take 1 tablet (25 mg total) by mouth daily. 01/27/22  Yes Wendie Agreste, MD  omeprazole (PRILOSEC) 20 MG capsule Take 1 capsule (20 mg total) by mouth daily. 01/27/22  Yes Wendie Agreste, MD  potassium  chloride (KLOR-CON) 10 MEQ tablet Take 2 tablets (20 mEq total) by mouth daily. 04/15/22  Yes Minus Breeding, MD  simvastatin (ZOCOR) 40 MG tablet Take 1 tablet (40 mg total) by mouth daily. 01/27/22  Yes Wendie Agreste, MD  Cholecalciferol (VITAMIN D3) 50 MCG (2000 UT) CAPS Take by mouth. Patient not taking: Reported on 05/06/2022    [provider]  Elastic Bandages & Supports (TRUFORM STOCKINGS [id number]) MISC 1 application by Does not apply route daily. Low intensity compression stockings, knee-high.  Okay to substitute with different brand as needed for insurance coverage.  Dispense 2 pairs. Patient not taking: Reported on 05/06/2022 01/27/22   Wendie Agreste, MD   Social History   Socioeconomic History   Marital status: Widowed    Spouse name: Not on file   Number of children: Not on file   Years of education: Not on file   Highest education level: Not on file  Occupational History   Not on file  Tobacco Use   Smoking status: Former    Packs/day: 0.50    Types: Cigarettes   Smokeless tobacco: Never  Vaping Use   Vaping Use: Never used  Substance and Sexual Activity   Alcohol use: No   Drug use: Never   Sexual activity: Not on file  Other Topics Concern   Not on file  Social History Narrative   Three children, 10 grands   Lives with daughter and 4 grands.     Social Determinants of Health   Financial Resource Strain: Not on file  Food Insecurity: Not on file  Transportation Needs: Not on file  Physical Activity: Not on file  Stress: Not on file  Social Connections: Not on file  Intimate Partner Violence: Not on file    Review of Systems Per HPI.   Objective:   Vitals:   07/04/22 1205  BP: 118/76  Pulse: 73  Resp: 16  Temp: 98.3 F (36.8 C)  TempSrc: Oral  SpO2: 97%  Weight: 196 lb 12.8 oz (89.3 kg)  Height: '5\' 3"'$  (1.6 m)     Physical Exam Vitals reviewed.  Constitutional:      General: She is not in acute distress.    Appearance: She is  well-developed.  HENT:     Head: Normocephalic and atraumatic.     Right Ear: Hearing, tympanic membrane, ear canal and external ear normal.     Left Ear: Hearing, tympanic membrane, ear canal and external ear normal.     Nose: Nose normal.     Mouth/Throat:     Pharynx: No oropharyngeal exudate.  Eyes:     Conjunctiva/sclera: Conjunctivae normal.     Pupils: Pupils are equal, round, and reactive to light.  Cardiovascular:     Rate and Rhythm: Normal rate and regular rhythm.     Heart sounds: Normal heart sounds. No murmur heard. Pulmonary:     Effort: Pulmonary effort is normal. No respiratory distress.     Breath sounds: Normal breath sounds. No wheezing or rhonchi.  Skin:    General: Skin is warm and dry.     Findings: No rash.  Neurological:     Mental Status: She is alert and oriented to person, place, and time.  Psychiatric:        Behavior: Behavior normal.     Results for orders placed or performed in visit on 07/04/22  POC COVID-19 BinaxNow  Result Value Ref Range   SARS Coronavirus 2 Ag Negative Negative  POC Influenza A&B(BINAX/QUICKVUE)  Result Value Ref Range   Influenza A, POC Negative Negative   Influenza B, POC        Assessment & Plan:  Frances Conley is a 61 y.o. female . Acute cough - Plan: benzonatate (TESSALON) 100 MG capsule, azithromycin (ZITHROMAX) 250 MG tablet, POC COVID-19 BinaxNow, POC Influenza A&B(BINAX/QUICKVUE)  Chest congestion - Plan: benzonatate (TESSALON) 100 MG capsule, azithromycin (ZITHROMAX) 250 MG tablet, POC COVID-19 BinaxNow, POC Influenza A&B(BINAX/QUICKVUE) Suspected initial viral illness, now with persistent cough.  Negative COVID/flu testing, still could have been COVID initially with transition to negative antigen testing at this time.  Lungs clear.  Vital signs reassuring.  Does not appear to be fluid overloaded.  Symptomatic care discussed with Mucinex, Tessalon initially with option of azithromycin if not improving next few  days.  RTC/ER precautions given.  Meds ordered this encounter  Medications   benzonatate (TESSALON) 100 MG capsule    Sig: Take 1 capsule (100 mg total) by mouth 3 (three) times daily as needed for cough.    Dispense:  20 capsule    Refill:  0   azithromycin (ZITHROMAX) 250 MG tablet    Sig: Take 2 tablets on day 1, then 1 tablet daily on days 2 through 5    Dispense:  6  tablet    Refill:  0   Patient Instructions  Okay to try Mucinex over-the-counter as that can help reduce the mucus and help clear the chest congestion.  Tessalon Perles were prescribed if needed up to 3 times per day for cough if the Mucinex is not helping on its own.  If cough including the discolored mucus is not improving in the next few days, start azithromycin antibiotic.  Hope you feel better soon!   Return to the clinic or go to the nearest emergency room if any of your symptoms worsen or new symptoms occur.  Cough, Adult Coughing is a reflex that clears your throat and your airways (respiratory system). Coughing helps to heal and protect your lungs. It is normal to cough occasionally, but a cough that happens with other symptoms or lasts a long time may be a sign of a condition that needs treatment. An acute cough may only last 2-3 weeks, while a chronic cough may last 8 or more weeks. Coughing is commonly caused by: Infection of the respiratory systemby viruses or bacteria. Breathing in substances that irritate your lungs. Allergies. Asthma. Mucus that runs down the back of your throat (postnasal drip). Smoking. Acid backing up from the stomach into the esophagus (gastroesophageal reflux). Certain medicines. Chronic lung problems. Other medical conditions such as heart failure or a blood clot in the lung (pulmonary embolism). Follow these instructions at home: Medicines Take over-the-counter and prescription medicines only as told by your health care provider. Talk with your health care provider before  you take a cough suppressant medicine. Lifestyle  Avoid cigarette smoke. Do not use any products that contain nicotine or tobacco, such as cigarettes, e-cigarettes, and chewing tobacco. If you need help quitting, ask your health care provider. Drink enough fluid to keep your urine pale yellow. Avoid caffeine. Do not drink alcohol if your health care provider tells you not to drink. General instructions  Pay close attention to changes in your cough. Tell your health care provider about them. Always cover your mouth when you cough. Avoid things that make you cough, such as perfume, candles, cleaning products, or campfire or tobacco smoke. If the air is dry, use a cool mist vaporizer or humidifier in your bedroom or your home to help loosen secretions. If your cough is worse at night, try to sleep in a semi-upright position. Rest as needed. Keep all follow-up visits as told by your health care provider. This is important. Contact a health care provider if you: Have new symptoms. Cough up pus. Have a cough that does not get better after 2-3 weeks or gets worse. Cannot control your cough with cough suppressant medicines and you are losing sleep. Have pain that gets worse or pain that is not helped with medicine. Have a fever. Have unexplained weight loss. Have night sweats. Get help right away if: You cough up blood. You have difficulty breathing. Your heartbeat is very fast. These symptoms may represent a serious problem that is an emergency. Do not wait to see if the symptoms will go away. Get medical help right away. Call your local emergency services (911 in the [place] Do not drive yourself to the hospital. Summary Coughing is a reflex that clears your throat and your airways. It is normal to cough occasionally, but a cough that happens with other symptoms or lasts a long time may be a sign of a condition that needs treatment. Take over-the-counter and prescription medicines only as  told by your health  care provider. Always cover your mouth when you cough. Contact a health care provider if you have new symptoms or a cough that does not get better after 2-3 weeks or gets worse. This information is not intended to replace advice given to you by your health care provider. Make sure you discuss any questions you have with your health care provider. Document Revised: 12/10/2018 Document Reviewed: 12/10/2018 Elsevier Patient Education  [place],   Merri Ray, MD [place], [facility] 07/04/22 12:57 PM

## 2022-07-06 ENCOUNTER — Encounter: Payer: Self-pay | Admitting: Family Medicine

## 2022-07-06 LAB — POC INFLUENZA A&B (BINAX/QUICKVUE): Influenza A, POC: NEGATIVE

## 2022-07-06 LAB — POC COVID19 BINAXNOW: SARS Coronavirus 2 Ag: NEGATIVE

## 2022-09-08 ENCOUNTER — Other Ambulatory Visit: Payer: Self-pay | Admitting: Family Medicine

## 2022-09-08 DIAGNOSIS — E782 Mixed hyperlipidemia: Secondary | ICD-10-CM

## 2022-09-14 ENCOUNTER — Telehealth: Payer: Self-pay | Admitting: Family Medicine

## 2022-09-14 NOTE — Telephone Encounter (Signed)
Caller name: Marveen   On DPR? :yes/no: Yes  Call back number:  [phone]  Provider they see: Carlota Raspberry  Reason for call: calling because wants referral to podiatrist for ingrown toenails on both feet.Pt states that she is having trouble seeing and that she cut the nails wrong on both feet, resulting in ingrown toenails.

## 2022-09-14 NOTE — Telephone Encounter (Signed)
Pt has appointment 09/21/22 @ 3:40 with Dr. Carlota Raspberry

## 2022-09-14 NOTE — Telephone Encounter (Signed)
Please have the patient schedule to see Dr Carlota Raspberry so we can refer her last visit was in July for acute cough will need a more recent visit for referral rational

## 2022-09-15 NOTE — Telephone Encounter (Signed)
Patient has been scheduled will order referral at that visit. Can close this phone note

## 2022-09-21 ENCOUNTER — Encounter: Payer: PRIVATE HEALTH INSURANCE | Admitting: Family Medicine

## 2022-09-26 ENCOUNTER — Encounter: Payer: Self-pay | Admitting: Family Medicine

## 2022-09-26 ENCOUNTER — Ambulatory Visit (INDEPENDENT_AMBULATORY_CARE_PROVIDER_SITE_OTHER): Payer: PRIVATE HEALTH INSURANCE | Admitting: Family Medicine

## 2022-09-26 VITALS — BP 138/88 | HR 54 | Temp 97.9°F | Ht 63.0 in | Wt 201.8 lb

## 2022-09-26 DIAGNOSIS — E559 Vitamin D deficiency, unspecified: Secondary | ICD-10-CM

## 2022-09-26 DIAGNOSIS — E782 Mixed hyperlipidemia: Secondary | ICD-10-CM

## 2022-09-26 DIAGNOSIS — R609 Edema, unspecified: Secondary | ICD-10-CM | POA: Diagnosis not present

## 2022-09-26 DIAGNOSIS — Z91148 Patient's other noncompliance with medication regimen for other reason: Secondary | ICD-10-CM

## 2022-09-26 DIAGNOSIS — R002 Palpitations: Secondary | ICD-10-CM | POA: Diagnosis not present

## 2022-09-26 DIAGNOSIS — R600 Localized edema: Secondary | ICD-10-CM

## 2022-09-26 DIAGNOSIS — I10 Essential (primary) hypertension: Secondary | ICD-10-CM

## 2022-09-26 DIAGNOSIS — R079 Chest pain, unspecified: Secondary | ICD-10-CM

## 2022-09-26 DIAGNOSIS — E0590 Thyrotoxicosis, unspecified without thyrotoxic crisis or storm: Secondary | ICD-10-CM

## 2022-09-26 DIAGNOSIS — B351 Tinea unguium: Secondary | ICD-10-CM
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
## 2022-09-26 NOTE — Patient Instructions (Addendum)
See info below on toenail fungus. I think it is reasonable to take lamisil once per day and make sure they check your liver tests every 6 weeks.   Please call Dr. Rosezella [place] office to follow up and discuss chest pains.  If chest pain returns be seen right away. [phone].   Set alarm if needed and use pill minder to help remember medications.   Recheck to discuss labs, plan in next 2 weeks. We can discuss other meds if needed at next visit.   Return to the clinic or go to the nearest emergency room if any of your symptoms worsen or new symptoms occur.  Take care!   Fungal Nail Infection A fungal nail infection is a common infection of the toenails or fingernails. This condition affects toenails more often than fingernails. It often affects the great, or big, toes. More than one nail may be infected. The condition can be passed from person to person (is contagious). What are the causes? This condition is caused by a fungus, such as yeast or molds. Several types of fungi can cause the infection. These fungi are common in moist and warm areas. If your hands or feet come into contact with the fungus, it may get into a crack in your fingernail or toenail or in the surrounding skin, and cause an infection. What increases the risk? The following factors may make you more likely to develop this condition: Being of older age. Having certain medical conditions, such as: Athlete's foot. Diabetes. Poor circulation. A weak body defense system (immune system). Walking barefoot in areas where the fungus thrives, such as showers or locker rooms. Wearing shoes and socks that cause your feet to sweat. Having a nail injury or a recent nail surgery. What are the signs or symptoms? Symptoms of this condition include: A pale spot on the nail. Thickening of the nail. A nail that becomes yellow, brown, or white. A brittle or ragged nail edge. A nail that has lifted away from the nail bed. How is this  diagnosed? This condition is diagnosed with a physical exam. Your health care provider may take a scraping or clipping from your nail to test for the fungus. How is this treated? Treatment is not needed for mild infections. If you have significant nail changes, treatment may include: Antifungal medicines taken by mouth (orally). You may need to take the medicine for several weeks or several months, and you may not see the results for a long time. These medicines can cause side effects. Ask your health care provider what problems to watch for. Antifungal nail polish or nail cream. These may be used along with oral antifungal medicines. Laser treatment of the nail. Surgery to remove the nail. This may be needed for the most severe infections. It can take a long time, usually up to a year, for the infection to go away. The infection may also come back. Follow these instructions at home: Medicines Take or apply over-the-counter and prescription medicines only as told by your health care provider. Ask your health care provider about using over-the-counter mentholated ointment on your nails. Nail care Trim your nails often. Wash and dry your hands and feet every day. Keep your feet dry. To do this: Wear absorbent socks, and change your socks frequently. Wear shoes that allow air to circulate, such as sandals or canvas tennis shoes. Throw out old shoes. If you go to a nail salon, make sure you choose one that uses clean instruments. Use antifungal foot  powder on your feet and in your shoes. General instructions Do not share personal items, such as towels or nail clippers. Do not walk barefoot in shower rooms or locker rooms. Wear rubber gloves if you are working with your hands in wet areas. Keep all follow-up visits. This is important. Contact a health care provider if: You have redness, pain, or pus near the toenail or fingernail. Your infection is not getting better, or it is getting worse  after several months. You have more circulation problems near the toenail or fingernail. You have brown or black discoloration of the nail that spreads to the surrounding skin. Summary A fungal nail infection is a common infection of the toenails or fingernails. Treatment is not needed for mild infections. If you have significant nail changes, treatment may include taking medicine orally and applying medicine to your nails. It can take a long time, usually up to a year, for the infection to go away. The infection may also come back. Take or apply over-the-counter and prescription medicines only as told by your health care provider. This information is not intended to replace advice given to you by your health care provider. Make sure you discuss any questions you have with your health care provider. Document Revised: 02/22/2021 Document Reviewed: 02/22/2021 Elsevier Patient Education  [place].

## 2022-09-27 LAB — COMPREHENSIVE METABOLIC PANEL
ALT: 31 U/L (ref 0–35)
AST: 37 U/L (ref 0–37)
Albumin: 4.3 g/dL (ref 3.5–5.2)
Alkaline Phosphatase: 95 U/L (ref 39–117)
BUN: 10 mg/dL (ref 6–23)
CO2: 27 mEq/L (ref 19–32)
Calcium: 8.8 mg/dL (ref 8.4–10.5)
Chloride: 103 mEq/L (ref 96–112)
Creatinine, Ser: 0.77 mg/dL (ref 0.40–1.20)
GFR: 83.83 mL/min (ref >60.00)
Glucose, Bld: 76 mg/dL (ref 70–99)
Potassium: 4.7 mEq/L (ref 3.5–5.1)
Sodium: 139 mEq/L (ref 135–145)
Total Bilirubin: 0.6 mg/dL (ref 0.2–1.2)
Total Protein: 7.8 g/dL (ref 6.0–8.3)

## 2022-09-27 LAB — VITAMIN D 25 HYDROXY (VIT D DEFICIENCY, FRACTURES): VITD: 23.31 ng/mL — ABNORMAL LOW (ref 30.00–100.00)

## 2022-09-27 LAB — CBC
HCT: 41.9 % (ref 36.0–46.0)
Hemoglobin: 13.9 g/dL (ref 12.0–15.0)
MCHC: 33.2 g/dL (ref 30.0–36.0)
MCV: 83.1 fl (ref 78.0–100.0)
Platelets: 173 10*3/uL (ref 150.0–400.0)
RBC: 5.04 Mil/uL (ref 3.87–5.11)
RDW: 14.1 % (ref 11.5–15.5)
WBC: 7 10*3/uL (ref 4.0–10.5)

## 2022-09-27 LAB — LIPID PANEL
Cholesterol: 164 mg/dL (ref 0–200)
HDL: 34.4 mg/dL — ABNORMAL LOW (ref >39.00)
LDL Cholesterol: 92 mg/dL (ref 0–99)
NonHDL: 129.39
Total CHOL/HDL Ratio: 5
Triglycerides: 188 mg/dL — ABNORMAL HIGH (ref 0.0–149.0)
VLDL: 37.6 mg/dL (ref 0.0–40.0)

## 2022-09-27 LAB — TSH+FREE T4: TSH W/REFLEX TO FT4: 0.86 mIU/L (ref 0.40–4.50)

## 2022-10-14 ENCOUNTER — Ambulatory Visit (INDEPENDENT_AMBULATORY_CARE_PROVIDER_SITE_OTHER): Payer: PRIVATE HEALTH INSURANCE | Admitting: Family Medicine

## 2022-10-14 ENCOUNTER — Encounter: Payer: Self-pay | Admitting: Family Medicine

## 2022-10-14 VITALS — BP 126/74 | HR 84 | Temp 97.9°F | Ht 63.0 in | Wt 201.4 lb

## 2022-10-14 DIAGNOSIS — R600 Localized edema: Secondary | ICD-10-CM

## 2022-10-14 DIAGNOSIS — M79672 Pain in left foot: Secondary | ICD-10-CM

## 2022-10-14 DIAGNOSIS — I10 Essential (primary) hypertension: Secondary | ICD-10-CM

## 2022-10-14 DIAGNOSIS — E782 Mixed hyperlipidemia: Secondary | ICD-10-CM | POA: Diagnosis not present

## 2022-10-14 DIAGNOSIS — R609 Edema, unspecified: Secondary | ICD-10-CM

## 2022-10-14 DIAGNOSIS — M79671 Pain in right foot: Secondary | ICD-10-CM

## 2022-10-14 DIAGNOSIS — E559 Vitamin D deficiency, unspecified: Secondary | ICD-10-CM | POA: Diagnosis not present

## 2022-10-14 NOTE — Progress Notes (Signed)
Subjective:  Patient ID: Frances Conley, female    DOB: May 12, 1961  Age: 61 y.o. MRN: [id number]  CC:  Chief Complaint  Patient presents with   Hyperlipidemia   Hypertension    Notes still having some swelling on her legs has gotten compression stockings, does have questions about lasix   Hyperthyroidism   Letter for School/Work    Patient has started a new job where it is a requirment to wear steel toe shoes patient reports this causes foot discomfort and other issues her job stated if she can get a letter stating she can wear the attachable type over her normal shoes rather than full steel toe it would be appreciated     HPI Frances Conley presents for  Concerns above.  Follow-up from October 23 visit.  Peripheral edema Intermittent use of HCTZ last visit, option to restart furosemide but also was not using compression stockings.  Plan to restart those initially.  We also discussed ways to assist with medication adherence including setting alarms/reminders. Purchased compression stockings, did not fit well - new ones today but helped with swelling.  Taking hctz daily now - also helped swelling.   Hyperthyroidism Episodic palpitations, weight loss discussed at last visit.  Has seen endocrinology previously, .  History of multinodular goiter, Graves' disease.  Mild hyperthyroidism previously without need for meds unless TSH below 0.2 or new symptoms.  TSH 0.86 on October 23. We did discuss follow-up with her cardiologist if persistent palpitations or chest symptoms. Felt brief palpitation today, resolved in few seconds. No prior flare since last visit and no chest pain.   Vitamin D deficiency Reading of 23.3 at October visit.  Had just restarted the 2000 units/day supplementation about a week and a half prior. Still taking daily.   Hyperlipidemia: Infrequent use of Zocor at her last visit, 2 days/week.  Difficulty remembering meds, discussed reminders, medication minder to assist. No  changes - just trying to take more consistently - no walking daily.   Lab Results  Component Value Date   CHOL 164 09/26/2022   HDL 34.40 (L) 09/26/2022   LDLCALC 92 09/26/2022   TRIG 188.0 (H) 09/26/2022   CHOLHDL 5 09/26/2022   Lab Results  Component Value Date   ALT 31 09/26/2022   AST 37 09/26/2022   ALKPHOS 95 09/26/2022   BILITOT 0.6 09/26/2022    Foot discomfort Recently started new job, requiring steel toe shoe usage. Steel toe shoes have hurt her toes - all toes. Not having pain with her usual shoes typically. Left great toe sore since cut toenails and when feel swell.  Has discussed foot pain with podiatrist. Dr. Barkley Bruns. [facility].  Requests letter for shoes for work to wear other shoes and wear a board over top of shoe to still provide protection.    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   Goiter, nodular 10/04/2016   Mass of left side of neck 10/04/2016   Benign essential hypertension 09/23/2016   Generalized osteoarthritis of multiple sites 09/23/2016   GERD without esophagitis 09/23/2016   Mixed hyperlipidemia 09/23/2016   Low serum thyroid stimulating hormone (TSH) 07/25/2016   Acute midline low back pain without sciatica 02/24/2016   Past Medical History:  Diagnosis Date  Arthritis    both hips, hands, shoulders goes to Dr Gladstone Lighter, [facility] ortho md   XTGGYIRS(854.6)    Hypercholesteremia    Hypertension    Ulcer    Past Surgical History:  Procedure Laterality Date   [place], 1984   TUBAL LIGATION     UTERINE FIBROID SURGERY     No Known Allergies Prior to Admission medications   Medication Sig Start Date End Date Taking? Authorizing Provider  albuterol (PROVENTIL) (2.5 MG/3ML)  0.083% nebulizer solution TAKE 1 CAPSULE BY MOUTH ONCE A WEEK . APPOINTMENT REQUIRED FOR FUTURE REFILLS   Yes [provider]  albuterol (PROVENTIL) (2.5 MG/3ML) 0.083% nebulizer solution Take 1 capsule by mouth daily.   Yes [provider]  Elastic Bandages & Supports (TRUFORM STOCKINGS [id number]) MISC 1 application by Does not apply route daily. Low intensity compression stockings, knee-high.  Okay to substitute with different brand as needed for insurance coverage.  Dispense 2 pairs. 01/27/22  Yes Wendie Agreste, MD  hydrochlorothiazide (HYDRODIURIL) 25 MG tablet Take 1 tablet (25 mg total) by mouth daily. 01/27/22  Yes Wendie Agreste, MD  hydrochlorothiazide (HYDRODIURIL) 25 MG tablet Take 1 tablet by mouth daily.   Yes [provider]  meloxicam (MOBIC) 7.5 MG tablet Take 1 tablet PO twice a day for 2 weeks and then PRN   Yes [provider]  omeprazole (PRILOSEC) 20 MG capsule Take 1 capsule (20 mg total) by mouth daily. 01/27/22  Yes Wendie Agreste, MD  potassium chloride (KLOR-CON) 10 MEQ tablet Take 2 tablets (20 mEq total) by mouth daily. 04/15/22  Yes Minus Breeding, MD  simvastatin (ZOCOR) 40 MG tablet Take 1 tablet by mouth once daily 09/08/22  Yes Wendie Agreste, MD  Cholecalciferol (VITAMIN D3) 50 MCG (2000 UT) CAPS Take by mouth. Patient not taking: Reported on 05/06/2022    [provider]  furosemide (LASIX) 40 MG tablet Take 1 tablet (40 mg total) by mouth daily. Patient not taking: Reported on 09/26/2022 04/15/22   Minus Breeding, MD  simvastatin (ZOCOR) 40 MG tablet Take 1 tablet by mouth once daily Patient not taking: Reported on 09/26/2022 09/08/22   Wendie Agreste, MD   Social History   Socioeconomic History   Marital status: Widowed    Spouse name: Not on file   Number of children: Not on file   Years of education: Not on file   Highest education level: Not on file  Occupational History   Not on file  Tobacco Use    Smoking status: Former    Packs/day: 0.50    Types: Cigarettes   Smokeless tobacco: Never  Vaping Use   Vaping Use: Never used  Substance and Sexual Activity   Alcohol use: No   Drug use: Never   Sexual activity: Not on file  Other Topics Concern   Not on file  Social History Narrative   Three children, 10 grands   Lives with daughter and 4 grands.     Social Determinants of Health   Financial Resource Strain: Not on file  Food Insecurity: Not on file  Transportation Needs: Not on file  Physical Activity: Not on file  Stress: Not on file  Social Connections: Not on file  Intimate Partner Violence: Not on file    Review of Systems   Objective:   Vitals:   10/14/22 1128  BP: 126/74  Pulse: 84  Temp: 97.9 F (36.6 C)  TempSrc: Oral  SpO2: 97%  Weight: 201 lb 6.4 oz (91.4 kg)  Height: '5\' 3"'$  (1.6 m)     Physical Exam Vitals reviewed.  Constitutional:      Appearance: Normal appearance. She is well-developed.  HENT:     Head: Normocephalic and atraumatic.  Eyes:     Conjunctiva/sclera: Conjunctivae normal.     Pupils: Pupils are equal, round, and reactive to light.  Neck:     Vascular: No carotid bruit.  Cardiovascular:     Rate and Rhythm: Normal rate and regular rhythm.     Heart sounds: Normal heart sounds.  Pulmonary:     Effort: Pulmonary effort is normal.     Breath sounds: Normal breath sounds.  Abdominal:     Palpations: Abdomen is soft. There is no pulsatile mass.     Tenderness: There is no abdominal tenderness.  Musculoskeletal:     Right lower leg: Edema (Approximately 1+ lower extremity bilaterally.  No stasis changes or wounds.) present.     Left lower leg: Edema present.     Comments: Right foot, no focal tenderness.  Left foot minimal discomfort over the great toe IP joint, medial nail fold without appreciable abscess or significant erythema, swelling. Remainder of foot, bones nontender.  Skin:    General: Skin is warm and dry.   Neurological:     Mental Status: She is alert and oriented to person, place, and time.  Psychiatric:        Mood and Affect: Mood normal.        Behavior: Behavior normal.      Assessment & Plan:  Frances Conley is a 61 y.o. female . Peripheral edema  -Some improvement with use of hydrochlorothiazide consistently.  Compression stockings, can find more comfortable ones, but discussed that tight foot fit is not as important as lower leg fit if she will be wearing shoes.  Continue to monitor with RTC precautions.  Mixed hyperlipidemia - Plan: Comprehensive metabolic panel, Lipid panel  -Improved adherence to statin, check labs in the next month.  Essential hypertension  -Stable with current regimen, HCTZ more consistently, continue same with repeat labs in 1 month.  Vitamin D deficiency - Plan: Vitamin D (25 hydroxy)  -Slightly low reading previously but had only been on meds for a week and a half.  Lab only visit next month, then consider higher dosing if still low.  Bilateral foot pain  -Intermittent, primarily with use of steel toe shoes, may be compression issue with her underlying pedal edema as well as toe pain.  Letter provided temporarily for her work, but recommended further discussion with podiatrist.  No orders of the defined types were placed in this encounter.  Patient Instructions  Lab only visit in 1 month to recheck cholesterol and vitamin D level.  We will also check liver tests at that time.   Compression stockings should continue to help with swelling but continued use of hydrochlorothiazide likely is helping as well.  Keep up the good work with the improved use of medications.  If any return of heart palpitations or chest pain, be seen and discuss with cardiologist. Return to the clinic or go to the nearest emergency room if any of your symptoms worsen or new symptoms occur.  I will complete a letter for work, but please discuss any specific restrictions further  and follow-up with foot specialist if any continued foot pain.  Take care and let me know if there are questions.  Signed,   Merri Ray, MD [place] 10/14/22 12:43 PM

## 2022-10-14 NOTE — Patient Instructions (Signed)
Lab only visit in 1 month to recheck cholesterol and vitamin D level.  We will also check liver tests at that time.   Compression stockings should continue to help with swelling but continued use of hydrochlorothiazide likely is helping as well.  Keep up the good work with the improved use of medications.  If any return of heart palpitations or chest pain, be seen and discuss with cardiologist. Return to the clinic or go to the nearest emergency room if any of your symptoms worsen or new symptoms occur.  I will complete a letter for work, but please discuss any specific restrictions further and follow-up with foot specialist if any continued foot pain.  Take care and let me know if there are questions.

## 2022-11-14 ENCOUNTER — Other Ambulatory Visit (INDEPENDENT_AMBULATORY_CARE_PROVIDER_SITE_OTHER): Payer: PRIVATE HEALTH INSURANCE

## 2022-11-14 DIAGNOSIS — E559 Vitamin D deficiency, unspecified: Secondary | ICD-10-CM | POA: Diagnosis not present

## 2022-11-14 DIAGNOSIS — E782 Mixed hyperlipidemia: Secondary | ICD-10-CM | POA: Diagnosis not present

## 2022-11-14 LAB — LIPID PANEL
Cholesterol: 163 mg/dL (ref 0–200)
HDL: 36 mg/dL — ABNORMAL LOW (ref >39.00)
LDL Cholesterol: 102 mg/dL — ABNORMAL HIGH (ref 0–99)
NonHDL: 127.2
Total CHOL/HDL Ratio: 5
Triglycerides: 126 mg/dL (ref 0.0–149.0)
VLDL: 25.2 mg/dL (ref 0.0–40.0)

## 2022-11-14 LAB — COMPREHENSIVE METABOLIC PANEL
ALT: 24 U/L (ref 0–35)
AST: 28 U/L (ref 0–37)
Albumin: 4.1 g/dL (ref 3.5–5.2)
Alkaline Phosphatase: 93 U/L (ref 39–117)
BUN: 10 mg/dL (ref 6–23)
CO2: 31 mEq/L (ref 19–32)
Calcium: 9.1 mg/dL (ref 8.4–10.5)
Chloride: 103 mEq/L (ref 96–112)
Creatinine, Ser: 0.84 mg/dL (ref 0.40–1.20)
GFR: 75.45 mL/min (ref >60.00)
Glucose, Bld: 83 mg/dL (ref 70–99)
Potassium: 3.6 mEq/L (ref 3.5–5.1)
Sodium: 142 mEq/L (ref 135–145)
Total Bilirubin: 0.7 mg/dL (ref 0.2–1.2)
Total Protein: 7.1 g/dL (ref 6.0–8.3)

## 2022-11-14 LAB — VITAMIN D 25 HYDROXY (VIT D DEFICIENCY, FRACTURES): VITD: 32.22 ng/mL (ref 30.00–100.00)

## 2022-12-01 ENCOUNTER — Other Ambulatory Visit: Payer: Self-pay | Admitting: Family Medicine

## 2022-12-01 DIAGNOSIS — I10 Essential (primary) hypertension: Secondary | ICD-10-CM

## 2023-01-05 ENCOUNTER — Other Ambulatory Visit (HOSPITAL_BASED_OUTPATIENT_CLINIC_OR_DEPARTMENT_OTHER): Payer: Self-pay | Admitting: Family Medicine

## 2023-01-05 DIAGNOSIS — Z1231 Encounter for screening mammogram for malignant neoplasm of breast: Secondary | ICD-10-CM

## 2023-01-05 NOTE — Progress Notes (Signed)
This encounter was created in error - please disregard.

## 2023-02-24 ENCOUNTER — Ambulatory Visit (HOSPITAL_BASED_OUTPATIENT_CLINIC_OR_DEPARTMENT_OTHER)
Admission: RE | Admit: 2023-02-24 | Discharge: 2023-02-24 | Disposition: A | Discharge status: Home / Self Care | Payer: PRIVATE HEALTH INSURANCE | Source: Ambulatory Visit | Attending: Family Medicine | Admitting: Family Medicine

## 2023-02-24 DIAGNOSIS — Z1231 Encounter for screening mammogram for malignant neoplasm of breast: Secondary | ICD-10-CM | POA: Insufficient documentation

## 2023-03-07 ENCOUNTER — Emergency Department (HOSPITAL_BASED_OUTPATIENT_CLINIC_OR_DEPARTMENT_OTHER)
Admission: EM | Admit: 2023-03-07 | Discharge: 2023-03-07 | Disposition: A | Discharge status: Home / Self Care | Payer: PRIVATE HEALTH INSURANCE | Attending: Emergency Medicine | Admitting: Emergency Medicine

## 2023-03-07 ENCOUNTER — Other Ambulatory Visit: Payer: Self-pay

## 2023-03-07 DIAGNOSIS — Z79899 Other long term (current) drug therapy: Secondary | ICD-10-CM | POA: Diagnosis not present

## 2023-03-07 DIAGNOSIS — I10 Essential (primary) hypertension: Secondary | ICD-10-CM | POA: Insufficient documentation

## 2023-03-07 DIAGNOSIS — G5621 Lesion of ulnar nerve, right upper limb: Secondary | ICD-10-CM | POA: Diagnosis not present

## 2023-03-07 DIAGNOSIS — R200 Anesthesia of skin: Secondary | ICD-10-CM | POA: Diagnosis present

## 2023-03-07 NOTE — ED Provider Notes (Signed)
Frances Provider Note   CSN: [id number] Arrival date & time: 03/07/23  1735     History  Chief Complaint  Patient presents with   Numbness    Frances Conley is a 62 y.o. female with a past medical history of hyperlipidemia and hypertension presenting today with numbness to the right fifth and fourth fingers.  Reports that started on Monday after she fell asleep on her flexed wrist and bent Conley.  Reports that it is improving but she continues with numbness that she wanted to get everything checked out.  Also is asking whether or not she should change her Claritin to Zyrtec because Claritin does not seem to be helping her as much for her seasonal allergies.    HPI     Home Medications Prior to Admission medications   Medication Sig Start Date End Date Taking? Authorizing Provider  albuterol (PROVENTIL) (2.5 MG/3ML) 0.083% nebulizer solution TAKE 1 CAPSULE BY MOUTH ONCE A WEEK . APPOINTMENT REQUIRED FOR FUTURE REFILLS    [provider]  albuterol (PROVENTIL) (2.5 MG/3ML) 0.083% nebulizer solution Take 1 capsule by mouth daily.    [provider]  Cholecalciferol (VITAMIN D3) 50 MCG (2000 UT) CAPS Take by mouth. Patient not taking: Reported on 05/06/2022    [provider]  Elastic Bandages & Supports (TRUFORM STOCKINGS 0000000) MISC 1 application by Does not apply route daily. Low intensity compression stockings, knee-high.  Okay to substitute with different brand as needed for insurance coverage.  Dispense 2 pairs. 01/27/22   Wendie Agreste, MD  furosemide (LASIX) 40 MG tablet Take 1 tablet (40 mg total) by mouth daily. Patient not taking: Reported on 09/26/2022 04/15/22   Minus Breeding, MD  hydrochlorothiazide (HYDRODIURIL) 25 MG tablet Take 1 tablet by mouth daily.    [provider]  hydrochlorothiazide (HYDRODIURIL) 25 MG tablet Take 1 tablet by mouth once daily 12/01/22   Wendie Agreste,  MD  meloxicam (MOBIC) 7.5 MG tablet Take 1 tablet PO twice a day for 2 weeks and then PRN    [provider]  omeprazole (PRILOSEC) 20 MG capsule Take 1 capsule (20 mg total) by mouth daily. 01/27/22   Wendie Agreste, MD  potassium chloride (KLOR-CON) 10 MEQ tablet Take 2 tablets (20 mEq total) by mouth daily. 04/15/22   Minus Breeding, MD  simvastatin (ZOCOR) 40 MG tablet Take 1 tablet by mouth once daily Patient not taking: Reported on 09/26/2022 09/08/22   Wendie Agreste, MD  simvastatin (ZOCOR) 40 MG tablet Take 1 tablet by mouth once daily 09/08/22   Wendie Agreste, MD      Allergies    Patient has no known allergies.    Review of Systems   Review of Systems  Physical Exam Updated Vital Signs BP (!) 141/89 (BP Location: Right Arm)   Pulse 71   Temp (!) 97.5 F (36.4 C)   Resp 16   Ht 5\' 3"  (1.6 m)   Wt 93.4 kg   SpO2 98%   BMI 36.49 kg/m  Physical Exam Vitals and nursing note reviewed.  Constitutional:      Appearance: Normal appearance.  HENT:     Head: Normocephalic and atraumatic.  Eyes:     General: No scleral icterus.    Conjunctiva/sclera: Conjunctivae normal.  Pulmonary:     Effort: Pulmonary effort is normal. No respiratory distress.  Musculoskeletal:     Comments: Full range of motion of bilateral  upper extremities.  No swelling.  Normal cap refill in all digits.  Normal range of motion of MCPs, PIPs and DIPs.  Skin:    Findings: No rash.  Neurological:     Mental Status: She is alert.     Comments: Cranial nerves II through XII grossly intact.  Normal strength of bilateral upper extremities.  No facial droop or aphasia  Psychiatric:        Mood and Affect: Mood normal.     ED Results / Procedures / Treatments   Labs (all labs ordered are listed, but only abnormal results are displayed) Labs Reviewed - No data to display  EKG None  Radiology No results found.  Procedures Procedures   Medications Ordered in ED Medications -  No data to display  ED Course/ Medical Decision Making/ A&P                             Medical Decision Making  62 year old female presenting with numbness to her fourth and fifth digits.  She says it started after falling asleep on a flexed wrist.  History and physical exam consistent with nerve compression.  She reports it is improving so she was instructed to ice her extremity and keep pressure off of the elbow (ulnar nerve) and to remove tight jewelry on her right wrist.  She is agreeable to this.  Will follow-up with PCP as well.  She was counseled that Zyrtec is an antihistamine and it is an option as well as Claritin.  If Claritin is not helping her she can switch to Zyrtec ultimately can talk about this with her PCP as well.  Negative stroke screen and patient was discharged.  Final Clinical Impression(s) / ED Diagnoses Final diagnoses:  Ulnar nerve compression, right    Rx / DC Orders ED Discharge Orders     None      Results and diagnoses were explained to the patient. Return precautions discussed in full. Patient had no additional questions and expressed complete understanding.   This chart was dictated using voice recognition software.  Despite best efforts to proofread,  errors can occur which can change the documentation meaning.    Rhae Hammock, PA-C 03/07/23 2329    Lorelle Gibbs, DO 03/07/23 2333

## 2023-03-07 NOTE — ED Triage Notes (Signed)
Arrives [facility] to ED. Ambulatory. NAD.  Right hand 'pins and needle' feeling x3 days. Mostly pinky and ring. No neck or back pain. No weakness noted.

## 2023-03-07 NOTE — Discharge Instructions (Signed)
You came to the emergency department with numbness in your fingers.  This is likely secondary to pressure on your ulnar nerve.  Please see your PCP about this.  As we discussed, you may use Zyrtec or Allegra in place of Claritin.  Allegra tends to make people slightly less drowsy.  Follow-up with your PCP about your nerve compression as well as your allergies.  It was a pleasure to meet you and we hope you feel better

## 2023-03-07 NOTE — ED Notes (Signed)
RN reviewed discharge instructions with pt. Pt verbalized understanding and had no further questions. VSS upon discharge.  

## 2023-03-29 ENCOUNTER — Emergency Department (HOSPITAL_BASED_OUTPATIENT_CLINIC_OR_DEPARTMENT_OTHER)
Admission: EM | Admit: 2023-03-29 | Discharge: 2023-03-29 | Disposition: A | Discharge status: Home / Self Care | Payer: PRIVATE HEALTH INSURANCE | Attending: Emergency Medicine | Admitting: Emergency Medicine

## 2023-03-29 ENCOUNTER — Other Ambulatory Visit: Payer: Self-pay

## 2023-03-29 ENCOUNTER — Encounter (HOSPITAL_BASED_OUTPATIENT_CLINIC_OR_DEPARTMENT_OTHER): Payer: Self-pay | Admitting: Emergency Medicine

## 2023-03-29 ENCOUNTER — Emergency Department (HOSPITAL_BASED_OUTPATIENT_CLINIC_OR_DEPARTMENT_OTHER): Payer: PRIVATE HEALTH INSURANCE | Admitting: Radiology

## 2023-03-29 DIAGNOSIS — R079 Chest pain, unspecified: Secondary | ICD-10-CM | POA: Diagnosis not present

## 2023-03-29 DIAGNOSIS — I10 Essential (primary) hypertension: Secondary | ICD-10-CM | POA: Diagnosis not present

## 2023-03-29 DIAGNOSIS — Z1152 Encounter for screening for COVID-19: Secondary | ICD-10-CM | POA: Diagnosis not present

## 2023-03-29 LAB — BASIC METABOLIC PANEL
Anion gap: 11 (ref 5–15)
BUN: 10 mg/dL (ref 6–20)
CO2: 29 mmol/L (ref 22–32)
Calcium: 8.7 mg/dL — ABNORMAL LOW (ref 8.9–10.3)
Chloride: 100 mmol/L (ref 98–111)
Creatinine, Ser: 0.89 mg/dL (ref 0.44–1.00)
GFR, Estimated: >60 mL/min (ref >60)
Glucose, Bld: 126 mg/dL — ABNORMAL HIGH (ref 70–99)
Potassium: 2.9 mmol/L — ABNORMAL LOW (ref 3.5–5.1)
Sodium: 140 mmol/L (ref 135–145)

## 2023-03-29 LAB — CBC
HCT: 41.9 % (ref 36.0–46.0)
Hemoglobin: 14 g/dL (ref 12.0–15.0)
MCH: 27.9 pg (ref 26.0–34.0)
MCHC: 33.4 g/dL (ref 30.0–36.0)
MCV: 83.6 fL (ref 80.0–100.0)
Platelets: 213 10*3/uL (ref 150–400)
RBC: 5.01 MIL/uL (ref 3.87–5.11)
RDW: 13.6 % (ref 11.5–15.5)
WBC: 8.3 10*3/uL (ref 4.0–10.5)
nRBC: 0 % (ref 0.0–0.2)

## 2023-03-29 LAB — TROPONIN I (HIGH SENSITIVITY)
Troponin I (High Sensitivity): 6 ng/L (ref <18)
Troponin I (High Sensitivity): 7 ng/L (ref <18)

## 2023-03-29 LAB — RESP PANEL BY RT-PCR (RSV, FLU A&B, COVID)  RVPGX2
Influenza A by PCR: NEGATIVE
Influenza B by PCR: NEGATIVE
Resp Syncytial Virus by PCR: NEGATIVE
SARS Coronavirus 2 by RT PCR: NEGATIVE

## 2023-03-29 MED ORDER — POTASSIUM CHLORIDE CRYS ER 20 MEQ PO TBCR: 40.0000 meq | EXTENDED_RELEASE_TABLET | Freq: Once | ORAL | Status: DC

## 2023-03-29 MED ORDER — POTASSIUM CHLORIDE 20 MEQ PO PACK
40.0000 meq | PACK | Freq: Every day | ORAL | Status: DC
  Administered 2023-03-29: 40 meq via ORAL

## 2023-03-29 MED ORDER — BENZONATATE 100 MG PO CAPS: 100.0000 mg | ORAL_CAPSULE | Freq: Three times a day (TID) | ORAL | 0 refills | Status: AC

## 2023-03-29 MED ORDER — POTASSIUM CHLORIDE 20 MEQ PO PACK
40.0000 meq | PACK | Freq: Every day | ORAL | Status: DC
  Filled 2023-03-29: qty 2

## 2023-03-29 MED ORDER — ACETAMINOPHEN 500 MG PO TABS
1000.0000 mg | ORAL_TABLET | Freq: Once | ORAL | Status: AC
  Administered 2023-03-29: 1000 mg via ORAL
  Filled 2023-03-29: qty 2

## 2023-03-29 NOTE — ED Notes (Signed)
Ambulatory to restroom

## 2023-03-29 NOTE — ED Triage Notes (Signed)
Left sided chest pain, palpitations started tonight. Started while eating. cough Denies new sob, no  n/v

## 2023-03-29 NOTE — Discharge Instructions (Signed)
Take tylenol 2 pills 4 times a day and motrin 4 pills 3 times a day.  Drink plenty of fluids.  Return for worsening shortness of breath, headache, confusion. Follow up with your family doctor.   

## 2023-03-29 NOTE — ED Provider Notes (Signed)
[place] EMERGENCY DEPARTMENT AT [facility] Provider Note   CSN: [id number] Arrival date & time: 03/29/23  0112     History  Chief Complaint  Patient presents with   Chest Pain    Frances Conley is a 62 y.o. female.  62 yo F with a chief complaints of chest pain.  This been going on for about an hour and a half.  She said it started while she was eating dinner.  She has been coughing quite a bit over the past couple days.  No fevers or chills.  It may be a little bit worse with coughing.  Feels like someone is pressing on her chest.  Denies exertional symptoms.  Patient denies history of MI, denies diabetes or smoking.  She has 2 different family members she thinks had bypass surgery in their 54s.  She has a history of hypertension and hyperlipidemia.  Patient denies history of PE or DVT denies hemoptysis denies unilateral lower extremity edema denies recent surgery immobilization hospitalization estrogen use or history of cancer.     Chest Pain      Home Medications Prior to Admission medications   Medication Sig Start Date End Date Taking? Authorizing Provider  benzonatate (TESSALON) 100 MG capsule Take 1 capsule (100 mg total) by mouth every 8 (eight) hours. 03/29/23  Yes Melene Plan, DO  albuterol (PROVENTIL) (2.5 MG/3ML) 0.083% nebulizer solution TAKE 1 CAPSULE BY MOUTH ONCE A WEEK . APPOINTMENT REQUIRED FOR FUTURE REFILLS    [provider]  albuterol (PROVENTIL) (2.5 MG/3ML) 0.083% nebulizer solution Take 1 capsule by mouth daily.    [provider]  Cholecalciferol (VITAMIN D3) 50 MCG (2000 UT) CAPS Take by mouth. Patient not taking: Reported on 05/06/2022    [provider]  Elastic Bandages & Supports (TRUFORM STOCKINGS 10-20MMHG) MISC 1 application by Does not apply route daily. Low intensity compression stockings, knee-high.  Okay to substitute with different brand as needed for insurance coverage.  Dispense 2 pairs. 01/27/22    Shade Flood, MD  furosemide (LASIX) 40 MG tablet Take 1 tablet (40 mg total) by mouth daily. Patient not taking: Reported on 09/26/2022 04/15/22   Rollene Rotunda, MD  hydrochlorothiazide (HYDRODIURIL) 25 MG tablet Take 1 tablet by mouth daily.    [provider]  hydrochlorothiazide (HYDRODIURIL) 25 MG tablet Take 1 tablet by mouth once daily 12/01/22   Shade Flood, MD  meloxicam (MOBIC) 7.5 MG tablet Take 1 tablet PO twice a day for 2 weeks and then PRN    [provider]  omeprazole (PRILOSEC) 20 MG capsule Take 1 capsule (20 mg total) by mouth daily. 01/27/22   Shade Flood, MD  potassium chloride (KLOR-CON) 10 MEQ tablet Take 2 tablets (20 mEq total) by mouth daily. 04/15/22   Rollene Rotunda, MD  simvastatin (ZOCOR) 40 MG tablet Take 1 tablet by mouth once daily Patient not taking: Reported on 09/26/2022 09/08/22   Shade Flood, MD  simvastatin (ZOCOR) 40 MG tablet Take 1 tablet by mouth once daily 09/08/22   Shade Flood, MD      Allergies    Patient has no known allergies.    Review of Systems   Review of Systems  Cardiovascular:  Positive for chest pain.    Physical Exam Updated Vital Signs BP 139/80 (BP Location: Right Arm)   Pulse 71   Temp 98.5 F (36.9 C) (Oral)   Resp 15   SpO2 100%  Physical Exam  Vitals and nursing note reviewed.  Constitutional:      General: She is not in acute distress.    Appearance: She is well-developed. She is not diaphoretic.  HENT:     Head: Normocephalic and atraumatic.  Eyes:     Pupils: Pupils are equal, round, and reactive to light.  Cardiovascular:     Rate and Rhythm: Normal rate and regular rhythm.     Heart sounds: No murmur heard.    No friction rub. No gallop.  Pulmonary:     Effort: Pulmonary effort is normal.     Breath sounds: No wheezing or rales.  Abdominal:     General: There is no distension.     Palpations: Abdomen is soft.     Tenderness: There is no abdominal  tenderness.  Musculoskeletal:        General: No tenderness.     Cervical back: Normal range of motion and neck supple.  Skin:    General: Skin is warm and dry.  Neurological:     Mental Status: She is alert and oriented to person, place, and time.  Psychiatric:        Behavior: Behavior normal.     ED Results / Procedures / Treatments   Labs (all labs ordered are listed, but only abnormal results are displayed) Labs Reviewed  BASIC METABOLIC PANEL - Abnormal; Notable for the following components:      Result Value   Potassium 2.9 (*)    Glucose, Bld 126 (*)    Calcium 8.7 (*)    All other components within normal limits  RESP PANEL BY RT-PCR (RSV, FLU A&B, COVID)  [id number]  CBC  TROPONIN I (HIGH SENSITIVITY)  TROPONIN I (HIGH SENSITIVITY)    EKG EKG Interpretation  Date/Time:  Wednesday March 29 2023 01:25:57 EDT Ventricular Rate:  74 PR Interval:  169 QRS Duration: 93 QT Interval:  388 QTC Calculation: 431 R Axis:   46 Text Interpretation: Sinus rhythm RSR' in V1 or V2, right VCD or RVH No significant change since last tracing Confirmed by Melene Plan [phone]) on 03/29/2023 1:31:28 AM  Radiology DG Chest 2 View  Result Date: 03/29/2023 CLINICAL DATA:  Left-sided chest pain with palpitations. EXAM: CHEST - 2 VIEW COMPARISON:  12/08/2021. FINDINGS: The heart size and mediastinal contours are within normal limits. Minimal subsegmental atelectasis or scarring at the left lung base. No consolidation, effusion, or pneumothorax. Degenerative changes are present in the thoracic spine. No acute osseous abnormality. IMPRESSION: No active cardiopulmonary disease. Electronically Signed   By: Thornell Sartorius M.D.   On: 03/29/2023 01:45    Procedures Procedures    Medications Ordered in ED Medications  potassium chloride (KLOR-CON) packet 40 mEq (40 mEq Oral Given 03/29/23 0231)  acetaminophen (TYLENOL) tablet 1,000 mg (1,000 mg Oral Given 03/29/23 0143)    ED Course/ Medical  Decision Making/ A&P                             Medical Decision Making Amount and/or Complexity of Data Reviewed Labs: ordered. Radiology: ordered.  Risk OTC drugs. Prescription drug management.   62 yo F with a chief complaint of chest pain.  This been going on for a couple days but got acutely worse about an hour and a half ago while she was eating dinner.  Pain is atypical in nature.  I am unable to reproduce it on exam.  Will obtain chest x-ray blood  work troponin.  Reassess.  Delta trop negative.  CXR independently interpreted by me without focal infiltrate or ptx.  No anemia, mild hypokalemia given supplement.  Likely muscular with cough.  Trial nsaids, pcp follow up.  3:50 AM:  I have discussed the diagnosis/risks/treatment options with the patient.  Evaluation and diagnostic testing in the emergency department does not suggest an emergent condition requiring admission or immediate intervention beyond what has been performed at this time.  They will follow up with PCP. We also discussed returning to the ED immediately if new or worsening sx occur. We discussed the sx which are most concerning (e.g., sudden worsening pain, fever, inability to tolerate by mouth) that necessitate immediate return. Medications administered to the patient during their visit and any new prescriptions provided to the patient are listed below.  Medications given during this visit Medications  potassium chloride (KLOR-CON) packet 40 mEq (40 mEq Oral Given 03/29/23 0231)  acetaminophen (TYLENOL) tablet 1,000 mg (1,000 mg Oral Given 03/29/23 0143)     The patient appears reasonably screen and/or stabilized for discharge and I doubt any other medical condition or other [facility] requiring further screening, evaluation, or treatment in the ED at this time prior to discharge.          Final Clinical Impression(s) / ED Diagnoses Final diagnoses:  Nonspecific chest pain    Rx / DC Orders ED Discharge  Orders          Ordered    benzonatate (TESSALON) 100 MG capsule  Every 8 hours        03/29/23 0349              Melene Plan, DO 03/29/23 0350

## 2023-04-17 ENCOUNTER — Encounter: Payer: Self-pay | Admitting: Family Medicine

## 2023-04-17 ENCOUNTER — Ambulatory Visit: Payer: PRIVATE HEALTH INSURANCE | Admitting: Family Medicine

## 2023-04-17 VITALS — BP 130/80 | HR 73 | Temp 98.0°F | Resp 16 | Ht 63.0 in | Wt 207.8 lb

## 2023-04-17 DIAGNOSIS — I10 Essential (primary) hypertension: Secondary | ICD-10-CM | POA: Diagnosis not present

## 2023-04-17 DIAGNOSIS — E782 Mixed hyperlipidemia: Secondary | ICD-10-CM

## 2023-04-17 DIAGNOSIS — E559 Vitamin D deficiency, unspecified: Secondary | ICD-10-CM | POA: Diagnosis not present

## 2023-04-17 DIAGNOSIS — R079 Chest pain, unspecified: Secondary | ICD-10-CM

## 2023-04-17 DIAGNOSIS — F439 Reaction to severe stress, unspecified: Secondary | ICD-10-CM

## 2023-04-17 DIAGNOSIS — R600 Localized edema: Secondary | ICD-10-CM

## 2023-04-17 DIAGNOSIS — R002 Palpitations: Secondary | ICD-10-CM

## 2023-04-17 DIAGNOSIS — E0590 Thyrotoxicosis, unspecified without thyrotoxic crisis or storm: Secondary | ICD-10-CM

## 2023-04-17 DIAGNOSIS — G5621 Lesion of ulnar nerve, right upper limb: Secondary | ICD-10-CM | POA: Diagnosis not present

## 2023-04-17 LAB — COMPREHENSIVE METABOLIC PANEL
ALT: 34 U/L (ref 0–35)
AST: 34 U/L (ref 0–37)
Albumin: 4 g/dL (ref 3.5–5.2)
Alkaline Phosphatase: 98 U/L (ref 39–117)
BUN: 12 mg/dL (ref 6–23)
CO2: 28 mEq/L (ref 19–32)
Calcium: 8.7 mg/dL (ref 8.4–10.5)
Chloride: 104 mEq/L (ref 96–112)
Creatinine, Ser: 0.84 mg/dL (ref 0.40–1.20)
GFR: 75.22 mL/min (ref >60.00)
Glucose, Bld: 95 mg/dL (ref 70–99)
Potassium: 4 mEq/L (ref 3.5–5.1)
Sodium: 141 mEq/L (ref 135–145)
Total Bilirubin: 0.5 mg/dL (ref 0.2–1.2)
Total Protein: 7.6 g/dL (ref 6.0–8.3)

## 2023-04-17 LAB — LIPID PANEL
Cholesterol: 151 mg/dL (ref 0–200)
HDL: 33.6 mg/dL — ABNORMAL LOW (ref >39.00)
LDL Cholesterol: 96 mg/dL (ref 0–99)
NonHDL: 117.06
Total CHOL/HDL Ratio: 4
Triglycerides: 107 mg/dL (ref 0.0–149.0)
VLDL: 21.4 mg/dL (ref 0.0–40.0)

## 2023-04-17 LAB — VITAMIN D 25 HYDROXY (VIT D DEFICIENCY, FRACTURES): VITD: 24.25 ng/mL — ABNORMAL LOW (ref 30.00–100.00)

## 2023-04-17 NOTE — Patient Instructions (Signed)
Thank you for coming in today.  I will check some labs for vitamin D, cholesterol, and thyroid.  See information below on stress and stress management.  Can discuss this further next visit.  That can be contributing to your heart palpitations or prior chest pain but glad that her chest pain has improved.  Increase caffeine intake also can affect palpitations and since those improved with less caffeine before, lets try to decrease caffeine again.  We will discuss this at follow-up visit. To help with remembering medications try the pillbox that I gave you today. Avoid direct pressure to your elbow and keep a journal of when you feel those symptoms and what activities you are doing as that will help me to decide other treatment or possible specialist referral next visit. Will discuss back pain at your next visit, but if any new or worsening symptoms be seen here sooner, urgent care or ER.  Take care!  Palpitations Palpitations are feelings that your heartbeat is irregular or is faster than normal. It may feel like your heart is fluttering or skipping a beat. Palpitations may be caused by many things, including smoking, caffeine, alcohol, stress, and certain medicines or drugs. Most causes of palpitations are not serious.  However, some palpitations can be a sign of a serious problem. Further tests and a thorough medical history will be done to find the cause of your palpitations. Your provider may order tests such as an ECG, labs, an echocardiogram, or an ambulatory continuous ECG monitor. Follow these instructions at home: Pay attention to any changes in your symptoms. Let your health care provider know about them. Take these actions to help manage your symptoms: Eating and drinking Follow instructions from your health care provider about eating or drinking restrictions. You may need to avoid foods and drinks that may cause palpitations. These may include: Caffeinated coffee, tea, soft drinks, and energy  drinks. Chocolate. Alcohol. Diet pills. Lifestyle     Take steps to reduce your stress and anxiety. Things that can help you relax include: Yoga. Mind-body activities, such as deep breathing, meditation, or using words and images to create positive thoughts (guided imagery). Physical activity, such as swimming, jogging, or walking. Tell your health care provider if your palpitations increase with activity. If you have chest pain or shortness of breath with activity, do not continue the activity until you are seen by your health care provider. Biofeedback. This is a method that helps you learn to use your mind to control things in your body, such as your heartbeat. Get plenty of rest and sleep. Keep a regular bed time. Do not use drugs, including cocaine or ecstasy. Do not use marijuana. Do not use any products that contain nicotine or tobacco. These products include cigarettes, chewing tobacco, and vaping devices, such as e-cigarettes. If you need help quitting, ask your health care provider. General instructions Take over-the-counter and prescription medicines only as told by your health care provider. Keep all follow-up visits. This is important. These may include visits for further testing if palpitations do not go away or get worse. Contact a health care provider if: You continue to have a fast or irregular heartbeat for a long period of time. You notice that your palpitations occur more often. Get help right away if: You have chest pain or shortness of breath. You have a severe headache. You feel dizzy or you faint. These symptoms may represent a serious problem that is an emergency. Do not wait to see if  the symptoms will go away. Get medical help right away. Call your local emergency services (911 in the [place] Do not drive yourself to the hospital. Summary Palpitations are feelings that your heartbeat is irregular or is faster than normal. It may feel like your heart is fluttering  or skipping a beat. Palpitations may be caused by many things, including smoking, caffeine, alcohol, stress, certain medicines, and drugs. Further tests and a thorough medical history may be done to find the cause of your palpitations. Get help right away if you faint or have chest pain, shortness of breath, severe headache, or dizziness. This information is not intended to replace advice given to you by your health care provider. Make sure you discuss any questions you have with your health care provider. Document Revised: 04/14/2021 Document Reviewed: 04/14/2021 Elsevier Patient Education  2023 [employer]   Managing Stress, Adult Feeling a certain amount of stress is normal. Stress helps our body and mind get ready to deal with the demands of life. Stress hormones can motivate you to do well at work and meet your responsibilities. But severe or long-term (chronic) stress can affect your mental and physical health. Chronic stress puts you at higher risk for: Anxiety and depression. Other health problems such as digestive problems, muscle aches, heart disease, high blood pressure, and stroke. What are the causes? Common causes of stress include: Demands from work, such as deadlines, feeling overworked, or having long hours. Pressures at home, such as money issues, disagreements with a spouse, or parenting issues. Pressures from major life changes, such as divorce, moving, loss of a loved one, or chronic illness. You may be at higher risk for stress-related problems if you: Do not get enough sleep. Are in poor health. Do not have emotional support. Have a mental health disorder such as anxiety or depression. How to recognize stress Stress can make you: Have trouble sleeping. Feel sad, anxious, irritable, or overwhelmed. Lose your appetite. Overeat or want to eat unhealthy foods. Want to use drugs or alcohol. Stress can also cause physical symptoms, such as: Sore, tense muscles,  especially in the shoulders and neck. Headaches. Trouble breathing. A faster heart rate. Stomach pain, nausea, or vomiting. Diarrhea or constipation. Trouble concentrating. Follow these instructions at home: Eating and drinking Eat a healthy diet. This includes: Eating foods that are high in fiber, such as beans, whole grains, and fresh fruits and vegetables. Limiting foods that are high in fat and processed sugars, such as fried or sweet foods. Do not skip meals or overeat. Drink enough fluid to keep your urine pale yellow. Alcohol use Do not drink alcohol if: Your health care provider tells you not to drink. You are pregnant, may be pregnant, or are planning to become pregnant. Drinking alcohol is a way some people try to ease their stress. This can be dangerous, so if you drink alcohol: Limit how much you have to: 0-1 drink a day for women. 0-2 drinks a day for men. Know how much alcohol is in your drink. In the [place] one drink equals one 12 oz bottle of beer (355 mL), one 5 oz glass of wine (148 mL), or one 1 oz glass of hard liquor (44 mL). Activity  Include 30 minutes of exercise in your daily schedule. Exercise is a good stress reducer. Include time in your day for an activity that you find relaxing. Try taking a walk, going on a bike ride, reading a book, or listening to music. Schedule your time  in a way that lowers stress, and keep a regular schedule. Focus on doing what is most important to get done. Lifestyle Identify the source of your stress and your reaction to it. See a therapist who can help you change unhelpful reactions. When there are stressful events: Talk about them with family, friends, or coworkers. Try to think realistically about stressful events and not ignore them or overreact. Try to find the positives in a stressful situation and not focus on the negatives. Cut back on responsibilities at work and home, if possible. Ask for help from friends or family  members if you need it. Find ways to manage stress, such as: Mindfulness, meditation, or deep breathing. Yoga or tai chi. Progressive muscle relaxation. Spending time in nature. Doing art, playing music, or reading. Making time for fun activities. Spending time with family and friends. Get support from family, friends, or spiritual resources. General instructions Get enough sleep. Try to go to sleep and get up at about the same time every day. Take over-the-counter and prescription medicines only as told by your health care provider. Do not use any products that contain nicotine or tobacco. These products include cigarettes, chewing tobacco, and vaping devices, such as e-cigarettes. If you need help quitting, ask your health care provider. Do not use drugs or smoke to deal with stress. Keep all follow-up visits. This is important. Where to find support Talk with your health care provider about stress management or finding a support group. Find a therapist to work with you on your stress management techniques. Where to find more information [employer] Mental Illness: www.nami.org American Psychological Association: [employer] Contact a health care provider if: Your stress symptoms get worse. You are unable to manage your stress at home. You are struggling to stop using drugs or alcohol. Get help right away if: You may be a danger to yourself or others. You have any thoughts of death or suicide. Get help right awayif you feel like you may hurt yourself or others, or have thoughts about taking your own life. Go to your nearest emergency room or: Call 911. Call the National Suicide Prevention Lifeline at 330-047-0929 or 988 in the [place] This is open 24 hours a day. Text the Crisis Text Line at 321-472-6070. Summary Feeling a certain amount of stress is normal, but severe or long-term (chronic) stress can affect your mental and physical health. Chronic stress can put you at  higher risk for anxiety, depression, and other health problems such as digestive problems, muscle aches, heart disease, high blood pressure, and stroke. You may be at higher risk for stress-related problems if you do not get enough sleep, are in poor health, lack emotional support, or have a mental health disorder such as anxiety or depression. Identify the source of your stress and your reaction to it. Try talking about stressful events with family, friends, or coworkers, finding a coping method, or getting support from spiritual resources. If you need more help, talk with your health care provider about finding a support group or a mental health therapist. This information is not intended to replace advice given to you by your health care provider. Make sure you discuss any questions you have with your health care provider. Document Revised: 06/17/2021 Document Reviewed: 06/15/2021 Elsevier Patient Education  2023 [employer].

## 2023-04-17 NOTE — Progress Notes (Signed)
Subjective:  Patient ID: Frances Conley, female    DOB: Apr 13, 1961  Age: 62 y.o. MRN: [id number]  CC:  Chief Complaint  Patient presents with   Follow-up    Follow up     HPI NATIA HUYCK presents for   Peripheral edema Improved with daily HCTZ at her November visit.  Compression stockings - still using intermittently - no recent use. Forgetting to take HCTZ - takes 5 times per week.    Hyperthyroidism History of multinodular goiter, Graves' disease.  Has seen endocrinology previously, no meds needed unless TSH below 0.2 or new symptoms.  Prior palpitations with plan on cardiology eval if persistent. TSH 0.86 on 09/26/22.  Some heart palpitations last week - similar in past, lasted longer a few weeks ago. No recent cardiology eval. Few caffeinated beverages per day. Had tried cutting back prior - less palpitations.  Chest pain 4/24 - ER visit. Negative troponin, CXR negative. Had been stressed.  More stressed past month a half.  Stress mgt currently: prayer, relaxation strategies, deep breaths.      04/17/2023   10:30 AM 12/02/2020    1:59 PM 06/19/2020    3:54 PM  GAD 7 : Generalized Anxiety Score  Nervous, Anxious, on Edge 0 2 0  Control/stop worrying 0 2 0  Worry too much - different things 0 2 0  Trouble relaxing 0 2 0  Restless 0 1 0  Easily annoyed or irritable 0 1 0  Afraid - awful might happen 0 0 0  Total GAD 7 Score 0 10 0  Anxiety Difficulty Not difficult at all Somewhat difficult Not difficult at all         04/17/2023   10:30 AM 10/14/2022   11:30 AM 09/26/2022    2:30 PM 07/04/2022   12:08 PM 01/27/2022   11:36 AM  Depression screen PHQ 2/9  Decreased Interest 0 0 0 0 0  Down, Depressed, Hopeless  0 0 0 0  PHQ - 2 Score 0 0 0 0 0  Altered sleeping 0 0 0    Tired, decreased energy 3 0 1    Change in appetite 0 0 0    Feeling bad or failure about yourself  0 0 0    Trouble concentrating 0 0 0    Moving slowly or fidgety/restless 0 0 0    Suicidal  thoughts 0 0 0    PHQ-9 Score 3 0 1    Difficult doing work/chores Not difficult at all        Vitamin D deficiency Treated with 2000 units over-the-counter supplement daily. Stopped taking supplement- been awhile. Just forgot.  Last vitamin D Lab Results  Component Value Date   VD25OH 32.22 11/14/2022   Hyperlipidemia: Treated with Zocor40mg , intermittent dosing with some difficulty remembering meds at her last visit, was taking more consistently at that time.  Borderline LDL in December. Still intermittent dosing - 5 days per week.  Has not set alarm.  Fasting today.   Lab Results  Component Value Date   CHOL 163 11/14/2022   HDL 36.00 (L) 11/14/2022   LDLCALC 102 (H) 11/14/2022   TRIG 126.0 11/14/2022   CHOLHDL 5 11/14/2022   Lab Results  Component Value Date   ALT 24 11/14/2022   AST 28 11/14/2022   ALKPHOS 93 11/14/2022   BILITOT 0.7 11/14/2022   Bilateral hand numbness: Started 4 days prior to 03/07/23 ER visit, left hand had resolved, R one continued -  seen in ER and diagnosed with ulnar nerve compression of elbow. Avoiding pressure off elbow, but still having symptoms at times. Intermittent, not persistent.  Numb into 4th-5th fingers. Notices with driving. Few times per week. Not daily.   At end of visit - asked about med for back pain, denies injury, hx of prior back pain - plans to discuss further at follow up appt - tylenol for now with ER/urgent care or quicker follow up if changes/worsening.    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   Goiter, nodular 10/04/2016   Mass of left side of neck 10/04/2016   Benign essential hypertension 09/23/2016   Generalized osteoarthritis of multiple sites 09/23/2016   GERD without esophagitis  09/23/2016   Mixed hyperlipidemia 09/23/2016   Vitamin D deficiency disease 09/23/2016   Low serum thyroid stimulating hormone (TSH) 07/25/2016   Acute midline low back pain without sciatica 02/24/2016   Past Medical History:  Diagnosis Date   Arthritis    both hips, hands, shoulders goes to Dr Darrelyn Hillock, [facility]   ZOXWRUEA(540.9)    Hypercholesteremia    Hypertension    Ulcer    Past Surgical History:  Procedure Laterality Date   CESAREAN SECTION     1981, 1986, 1984   TUBAL LIGATION     UTERINE FIBROID SURGERY     No Known Allergies Prior to Admission medications   Medication Sig Start Date End Date Taking? Authorizing Provider  albuterol (PROVENTIL) (2.5 MG/3ML) 0.083% nebulizer solution TAKE 1 CAPSULE BY MOUTH ONCE A WEEK . APPOINTMENT REQUIRED FOR FUTURE REFILLS   Yes [provider]  albuterol (PROVENTIL) (2.5 MG/3ML) 0.083% nebulizer solution Take 1 capsule by mouth daily.   Yes [provider]  benzonatate (TESSALON) 100 MG capsule Take 1 capsule (100 mg total) by mouth every 8 (eight) hours. 03/29/23  Yes Melene Plan, DO  Cholecalciferol (VITAMIN D3) 50 MCG (2000 UT) CAPS Take by mouth.   Yes [provider]  Elastic Bandages & Supports (TRUFORM STOCKINGS 10-20MMHG) MISC 1 application by Does not apply route daily. Low intensity compression stockings, knee-high.  Okay to substitute with different brand as needed for insurance coverage.  Dispense 2 pairs. 01/27/22  Yes Shade Flood, MD  fluticasone (FLONASE) 50 MCG/ACT nasal spray 1 spray Once Daily for 30 days. 02/22/19  Yes [provider]  furosemide (LASIX) 40 MG tablet Take 1 tablet (40 mg total) by mouth daily. 04/15/22  Yes Rollene Rotunda, MD  hydrochlorothiazide (HYDRODIURIL) 25 MG tablet Take 1 tablet by mouth daily.   Yes [provider]  hydrochlorothiazide (HYDRODIURIL) 25 MG tablet Take 1 tablet by mouth once daily 12/01/22  Yes Shade Flood, MD   loratadine (CLARITIN) 10 MG tablet Take by mouth. 04/30/19  Yes [provider]  meloxicam (MOBIC) 7.5 MG tablet Take 1 tablet PO twice a day for 2 weeks and then PRN   Yes [provider]  metaxalone (SKELAXIN) 800 MG tablet Take by mouth. 01/29/18  Yes [provider]  omeprazole (PRILOSEC) 20 MG capsule Take 1 capsule (20 mg total) by mouth daily. 01/27/22  Yes Shade Flood, MD  potassium chloride (KLOR-CON) 10 MEQ tablet Take 2 tablets (20 mEq total) by mouth daily. 04/15/22  Yes  Rollene Rotunda, MD  simvastatin (ZOCOR) 40 MG tablet Take 1 tablet by mouth once daily 09/08/22  Yes Shade Flood, MD  simvastatin (ZOCOR) 40 MG tablet Take 1 tablet by mouth once daily 09/08/22  Yes Shade Flood, MD   Social History   Socioeconomic History   Marital status: Widowed    Spouse name: Not on file   Number of children: Not on file   Years of education: Not on file   Highest education level: Not on file  Occupational History   Not on file  Tobacco Use   Smoking status: Former    Packs/day: .5    Types: Cigarettes   Smokeless tobacco: Never  Vaping Use   Vaping Use: Never used  Substance and Sexual Activity   Alcohol use: No   Drug use: Never   Sexual activity: Not on file  Other Topics Concern   Not on file  Social History Narrative   Three children, 10 grands   Lives with daughter and 4 grands.     Social Determinants of Health   Financial Resource Strain: Not on file  Food Insecurity: Not on file  Transportation Needs: Not on file  Physical Activity: Not on file  Stress: Not on file  Social Connections: Not on file  Intimate Partner Violence: Not on file    Review of Systems Per HPI. Denies current chest pain.   Objective:   Vitals:   04/17/23 1027  BP: 130/80  Pulse: 73  Resp: 16  Temp: 98 F (36.7 C)  TempSrc: Oral  SpO2: 97%  Weight: 207 lb 12.8 oz (94.3 kg)  Height: 5\' 3"  (1.6 m)     Physical Exam Vitals reviewed.   Constitutional:      Appearance: Normal appearance. She is well-developed.  HENT:     Head: Normocephalic and atraumatic.  Eyes:     Conjunctiva/sclera: Conjunctivae normal.     Pupils: Pupils are equal, round, and reactive to light.  Neck:     Vascular: No carotid bruit.  Cardiovascular:     Rate and Rhythm: Normal rate and regular rhythm.     Heart sounds: Normal heart sounds.  Pulmonary:     Effort: Pulmonary effort is normal.     Breath sounds: Normal breath sounds.  Abdominal:     Palpations: Abdomen is soft. There is no pulsatile mass.     Tenderness: There is no abdominal tenderness.  Musculoskeletal:     Right lower leg: Edema (Trace lower extremities bilaterally.) present.     Left lower leg: Edema present.     Comments: Negative Tinel over right elbow, wrist at ulnar nerve.  Denies current dysesthesias.  Skin:    General: Skin is warm and dry.  Neurological:     Mental Status: She is alert and oriented to person, place, and time.  Psychiatric:        Mood and Affect: Mood normal.        Behavior: Behavior normal.    55 minutes spent during visit, including chart review, ER note review for 2 visits, review of concerns as above including some of her chronic conditions, counseling and assimilation of information, exam, discussion of plan, and chart completion.   Assessment & Plan:  ELISE COOP is a 62 y.o. female . Hyperthyroidism - Plan: TSH + free T4  -Check updated labs, consider endocrinology eval, treatment for hyperthyroidism if lower readings or persistent palpitations in spite of change in caffeine intake as that  could be symptomatic hyperparathyroidism.  Will recheck in the next 2 weeks.  Benign essential hypertension with peripheral edema  -Recommend HCTZ daily, has missed a few doses per week but overall doing well.  I did give her a pill minder box today to help with remembering if she has or has not taken medications.  I think that will help.  Check  electrolytes with CMP.  Compression stockings also discussed.  Ulnar neuropathy at elbow, right  -Intermittent, reassuring exam at present.  ER follow-up as above.  Initial bilateral symptoms then just right-sided, and although intermittent still does experience symptoms at times.  Recommended she journal what she is doing when she notices symptoms to see if there is a specific pattern of possible compressive neuropathy or possible need for hand/Ortho eval.  Recheck 2 weeks  Mixed hyperlipidemia - Plan: Comprehensive metabolic panel, Lipid panel  -Tolerating statin, few missed doses as above but overall her labs look pretty good last visit with current use at that time.  Updated labs ordered, pill minder as above to help with adherence.  Vitamin D deficiency - Plan: Vitamin D (25 hydroxy)  -Off supplement recently, check vitamin D level to establish baseline and determine if continued supplementation needed.  Chest pain, unspecified type  -Fleeting symptoms as above, ER follow-up.  Reassuring evaluation in the ER with chest x-ray, normal troponin, and reports no concerns on EKG.  Asymptomatic at present.  Possible stress component.  Handout given on stress management and will discuss further in 2 weeks.  Commended on current approaches on stress management with prayer, breathing, other relaxation strategies.  Palpitations - Plan: TSH + free T4  -Intermittent.  Hyperthyroid as above, check updated labs.  Also has noted improvement in palpitations with decreasing caffeine.  Recommended trial of decrease caffeine at this time, check labs above, and if no improvement with decrease caffeine consider cardiology eval.  If chest pain returns we will also have her be seen by cardiology, ER precautions given.  Situational stress  -Denies depression or persistent anxiety.  Situational stressors, handout given on stress management and will discuss further at follow-up in 2 weeks.  As above she has had some  recurrent back pain, minimal improvement with Tylenol.  Plans to discuss further at her follow-up visit with ER/urgent care or RTC precautions sooner if any changes.  No orders of the defined types were placed in this encounter.  Patient Instructions  Thank you for coming in today.  I will check some labs for vitamin D, cholesterol, and thyroid.  See information below on stress and stress management.  Can discuss this further next visit.  That can be contributing to your heart palpitations or prior chest pain but glad that her chest pain has improved.  Increase caffeine intake also can affect palpitations and since those improved with less caffeine before, lets try to decrease caffeine again.  We will discuss this at follow-up visit. To help with remembering medications try the pillbox that I gave you today. Avoid direct pressure to your elbow and keep a journal of when you feel those symptoms and what activities you are doing as that will help me to decide other treatment or possible specialist referral next visit. Will discuss back pain at your next visit, but if any new or worsening symptoms be seen here sooner, urgent care or ER.  Take care!  Palpitations Palpitations are feelings that your heartbeat is irregular or is faster than normal. It may feel like your heart  is fluttering or skipping a beat. Palpitations may be caused by many things, including smoking, caffeine, alcohol, stress, and certain medicines or drugs. Most causes of palpitations are not serious.  However, some palpitations can be a sign of a serious problem. Further tests and a thorough medical history will be done to find the cause of your palpitations. Your provider may order tests such as an ECG, labs, an echocardiogram, or an ambulatory continuous ECG monitor. Follow these instructions at home: Pay attention to any changes in your symptoms. Let your health care provider know about them. Take these actions to help manage your  symptoms: Eating and drinking Follow instructions from your health care provider about eating or drinking restrictions. You may need to avoid foods and drinks that may cause palpitations. These may include: Caffeinated coffee, tea, soft drinks, and energy drinks. Chocolate. Alcohol. Diet pills. Lifestyle     Take steps to reduce your stress and anxiety. Things that can help you relax include: Yoga. Mind-body activities, such as deep breathing, meditation, or using words and images to create positive thoughts (guided imagery). Physical activity, such as swimming, jogging, or walking. Tell your health care provider if your palpitations increase with activity. If you have chest pain or shortness of breath with activity, do not continue the activity until you are seen by your health care provider. Biofeedback. This is a method that helps you learn to use your mind to control things in your body, such as your heartbeat. Get plenty of rest and sleep. Keep a regular bed time. Do not use drugs, including cocaine or ecstasy. Do not use marijuana. Do not use any products that contain nicotine or tobacco. These products include cigarettes, chewing tobacco, and vaping devices, such as e-cigarettes. If you need help quitting, ask your health care provider. General instructions Take over-the-counter and prescription medicines only as told by your health care provider. Keep all follow-up visits. This is important. These may include visits for further testing if palpitations do not go away or get worse. Contact a health care provider if: You continue to have a fast or irregular heartbeat for a long period of time. You notice that your palpitations occur more often. Get help right away if: You have chest pain or shortness of breath. You have a severe headache. You feel dizzy or you faint. These symptoms may represent a serious problem that is an emergency. Do not wait to see if the symptoms will go away.  Get medical help right away. Call your local emergency services (911 in the [place] Do not drive yourself to the hospital. Summary Palpitations are feelings that your heartbeat is irregular or is faster than normal. It may feel like your heart is fluttering or skipping a beat. Palpitations may be caused by many things, including smoking, caffeine, alcohol, stress, certain medicines, and drugs. Further tests and a thorough medical history may be done to find the cause of your palpitations. Get help right away if you faint or have chest pain, shortness of breath, severe headache, or dizziness. This information is not intended to replace advice given to you by your health care provider. Make sure you discuss any questions you have with your health care provider. Document Revised: 04/14/2021 Document Reviewed: 04/14/2021 Elsevier Patient Education  2023 Elsevier Inc.   Managing Stress, Adult Feeling a certain amount of stress is normal. Stress helps our body and mind get ready to deal with the demands of life. Stress hormones can motivate you to do well  at work and meet your responsibilities. But severe or long-term (chronic) stress can affect your mental and physical health. Chronic stress puts you at higher risk for: Anxiety and depression. Other health problems such as digestive problems, muscle aches, heart disease, high blood pressure, and stroke. What are the causes? Common causes of stress include: Demands from work, such as deadlines, feeling overworked, or having long hours. Pressures at home, such as money issues, disagreements with a spouse, or parenting issues. Pressures from major life changes, such as divorce, moving, loss of a loved one, or chronic illness. You may be at higher risk for stress-related problems if you: Do not get enough sleep. Are in poor health. Do not have emotional support. Have a mental health disorder such as anxiety or depression. How to recognize  stress Stress can make you: Have trouble sleeping. Feel sad, anxious, irritable, or overwhelmed. Lose your appetite. Overeat or want to eat unhealthy foods. Want to use drugs or alcohol. Stress can also cause physical symptoms, such as: Sore, tense muscles, especially in the shoulders and neck. Headaches. Trouble breathing. A faster heart rate. Stomach pain, nausea, or vomiting. Diarrhea or constipation. Trouble concentrating. Follow these instructions at home: Eating and drinking Eat a healthy diet. This includes: Eating foods that are high in fiber, such as beans, whole grains, and fresh fruits and vegetables. Limiting foods that are high in fat and processed sugars, such as fried or sweet foods. Do not skip meals or overeat. Drink enough fluid to keep your urine pale yellow. Alcohol use Do not drink alcohol if: Your health care provider tells you not to drink. You are pregnant, may be pregnant, or are planning to become pregnant. Drinking alcohol is a way some people try to ease their stress. This can be dangerous, so if you drink alcohol: Limit how much you have to: 0-1 drink a day for women. 0-2 drinks a day for men. Know how much alcohol is in your drink. In the [place] one drink equals one 12 oz bottle of beer (355 mL), one 5 oz glass of wine (148 mL), or one 1 oz glass of hard liquor (44 mL). Activity  Include 30 minutes of exercise in your daily schedule. Exercise is a good stress reducer. Include time in your day for an activity that you find relaxing. Try taking a walk, going on a bike ride, reading a book, or listening to music. Schedule your time in a way that lowers stress, and keep a regular schedule. Focus on doing what is most important to get done. Lifestyle Identify the source of your stress and your reaction to it. See a therapist who can help you change unhelpful reactions. When there are stressful events: Talk about them with family, friends, or  coworkers. Try to think realistically about stressful events and not ignore them or overreact. Try to find the positives in a stressful situation and not focus on the negatives. Cut back on responsibilities at work and home, if possible. Ask for help from friends or family members if you need it. Find ways to manage stress, such as: Mindfulness, meditation, or deep breathing. Yoga or tai chi. Progressive muscle relaxation. Spending time in nature. Doing art, playing music, or reading. Making time for fun activities. Spending time with family and friends. Get support from family, friends, or spiritual resources. General instructions Get enough sleep. Try to go to sleep and get up at about the same time every day. Take over-the-counter and prescription medicines only as  told by your health care provider. Do not use any products that contain nicotine or tobacco. These products include cigarettes, chewing tobacco, and vaping devices, such as e-cigarettes. If you need help quitting, ask your health care provider. Do not use drugs or smoke to deal with stress. Keep all follow-up visits. This is important. Where to find support Talk with your health care provider about stress management or finding a support group. Find a therapist to work with you on your stress management techniques. Where to find more information [employer] on Mental Illness: www.nami.org American Psychological Association: [employer] Contact a health care provider if: Your stress symptoms get worse. You are unable to manage your stress at home. You are struggling to stop using drugs or alcohol. Get help right away if: You may be a danger to yourself or others. You have any thoughts of death or suicide. Get help right awayif you feel like you may hurt yourself or others, or have thoughts about taking your own life. Go to your nearest emergency room or: Call 911. Call the National Suicide Prevention Lifeline at  (304)274-9975 or 988 in the [place] This is open 24 hours a day. Text the Crisis Text Line at 541-240-8844. Summary Feeling a certain amount of stress is normal, but severe or long-term (chronic) stress can affect your mental and physical health. Chronic stress can put you at higher risk for anxiety, depression, and other health problems such as digestive problems, muscle aches, heart disease, high blood pressure, and stroke. You may be at higher risk for stress-related problems if you do not get enough sleep, are in poor health, lack emotional support, or have a mental health disorder such as anxiety or depression. Identify the source of your stress and your reaction to it. Try talking about stressful events with family, friends, or coworkers, finding a coping method, or getting support from spiritual resources. If you need more help, talk with your health care provider about finding a support group or a mental health therapist. This information is not intended to replace advice given to you by your health care provider. Make sure you discuss any questions you have with your health care provider. Document Revised: 06/17/2021 Document Reviewed: 06/15/2021 Elsevier Patient Education  2023 Elsevier Inc.     Signed,   Meredith Staggers, MD [facility], [facility] 04/17/23 10:44 AM

## 2023-04-18 LAB — TSH+FREE T4: TSH W/REFLEX TO FT4: 0.82 mIU/L (ref 0.40–4.50)

## 2023-05-05 ENCOUNTER — Ambulatory Visit: Payer: PRIVATE HEALTH INSURANCE | Admitting: Family Medicine

## 2023-05-08 ENCOUNTER — Ambulatory Visit (INDEPENDENT_AMBULATORY_CARE_PROVIDER_SITE_OTHER): Payer: PRIVATE HEALTH INSURANCE | Admitting: Family Medicine

## 2023-05-08 ENCOUNTER — Encounter: Payer: Self-pay | Admitting: Family Medicine

## 2023-05-08 VITALS — BP 120/70 | HR 65 | Wt 210.8 lb

## 2023-05-08 DIAGNOSIS — E559 Vitamin D deficiency, unspecified: Secondary | ICD-10-CM

## 2023-05-08 DIAGNOSIS — H6122 Impacted cerumen, left ear: Secondary | ICD-10-CM

## 2023-05-08 DIAGNOSIS — I10 Essential (primary) hypertension: Secondary | ICD-10-CM

## 2023-05-08 DIAGNOSIS — R079 Chest pain, unspecified: Secondary | ICD-10-CM

## 2023-05-08 DIAGNOSIS — F439 Reaction to severe stress, unspecified: Secondary | ICD-10-CM

## 2023-05-08 DIAGNOSIS — R002 Palpitations: Secondary | ICD-10-CM | POA: Diagnosis not present

## 2023-05-08 NOTE — Progress Notes (Signed)
Subjective:  Patient ID: Frances Conley, female    DOB: [birth year]  Age: 62 y.o. MRN: [id number]  CC:  Chief Complaint  Patient presents with   Follow-up    Follow up and issues with left ear.    HPI Frances Conley presents for as above, follow-up from May 13 visit with multiple concerns addressed at that time, and new concern of left ear  Left ear pain About 5-6 days ago. Noticed noise in left ear. No pain. No loss of hearing. No discharge.   Palpitations See last visit, had been seen in ER with chest pain, normal troponin, reassuring chest x-ray.  Possible stress component.  Also some palpitations, had improved with decreasing caffeine.  Option of cardiology eval, but handout given on palpitations, stress management and continue decreasing caffeine intake recommended.  TSH with history of hyperthyroidism was stable, CMP normal.  CBC April 24 normal. Cut back on caffeine. Getting better with less caffeine intake. No change in stress mgt. Only 1-2 episodes of palpitations. Chest pain in left side when upset. No current chest pain. No current therapist.   Hypertension Treated with HCTZ, few doses missed per week discussed last visit, recommended pill minder box and 1 given last visit to help with adherence.  We also discussed peripheral edema and use of compression stockings. Has pillbox for meds - has been taking meds daily   Ulnar neuropathy Intermittent right sided neuropathic symptoms discussed that last visit, thought to be ulnar neuropathy.  Recommended avoidance of compression of elbow.  Recommended she journal specific activities that cause symptoms to see if there is a specific pattern or possible compressive neuropathy, with option of Ortho/hand eval. Pain and numbness into outside of hand and 4th-5th fingers has resolved. Minimal discomfort into hand but everything better. Changed sleeping position.   Vitamin D deficiency Mildly low at 24.25 at last visit.  Over-the-counter  supplement 1000 to 2000 units/day with recheck in a few months discussed. She notes she had been taking 2000units per day - including at last bloodwork, but had forgotten doses a few times per week - taking daily now.    Recurrent back pain Briefly mentioned at her last visit.  Has taken over-the-counter Tylenol with minimal improvement. Today - notes back pain from last visit had been there a week. No known injury.  No back pain at this time. Resolved on its own after a week. Doing ok now.     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   Goiter, nodular 10/04/2016   Mass of left side of neck 10/04/2016   Benign essential hypertension 09/23/2016   Generalized osteoarthritis of multiple sites 09/23/2016   GERD without esophagitis 09/23/2016   Mixed hyperlipidemia 09/23/2016   Vitamin D deficiency disease 09/23/2016   Low serum thyroid stimulating hormone (TSH) 07/25/2016   Acute midline low back pain without sciatica 02/24/2016   Past Medical History:  Diagnosis Date   Arthritis    both hips, hands, shoulders goes to Dr Darrelyn Hillock, [facility] md   [id number](478.2)    Hypercholesteremia    Hypertension    Ulcer    Past Surgical History:  Procedure Laterality Date   CESAREAN SECTION     1981, 1986, 1984   TUBAL LIGATION  UTERINE FIBROID SURGERY     No Known Allergies Prior to Admission medications   Medication Sig Start Date End Date Taking? Authorizing Provider  Cholecalciferol (VITAMIN D3) 50 MCG (2000 UT) CAPS Take by mouth.   Yes [provider]  Elastic Bandages & Supports (TRUFORM STOCKINGS 10-20MMHG) MISC 1 application by Does not apply route daily. Low intensity compression stockings, knee-high.  Okay to substitute with  different brand as needed for insurance coverage.  Dispense 2 pairs. 01/27/22  Yes Shade Flood, MD  hydrochlorothiazide (HYDRODIURIL) 25 MG tablet Take 1 tablet by mouth once daily 12/01/22  Yes Shade Flood, MD  metaxalone [facility]) 800 MG tablet Take by mouth. 01/29/18  Yes [provider]  omeprazole (PRILOSEC) 20 MG capsule Take 1 capsule (20 mg total) by mouth daily. 01/27/22  Yes Shade Flood, MD  potassium chloride (KLOR-CON) 10 MEQ tablet Take 2 tablets (20 mEq total) by mouth daily. 04/15/22  Yes Rollene Rotunda, MD  simvastatin (ZOCOR) 40 MG tablet Take 1 tablet by mouth once daily 09/08/22  Yes Shade Flood, MD  benzonatate (TESSALON) 100 MG capsule Take 1 capsule (100 mg total) by mouth every 8 (eight) hours. Patient not taking: Reported on 05/08/2023 03/29/23   Melene Plan, DO  furosemide (LASIX) 40 MG tablet Take 1 tablet (40 mg total) by mouth daily. Patient not taking: Reported on 05/08/2023 04/15/22   Rollene Rotunda, MD  loratadine (CLARITIN) 10 MG tablet Take by mouth. Patient not taking: Reported on 05/08/2023 04/30/19   [provider]  meloxicam (MOBIC) 7.5 MG tablet Take 1 tablet PO twice a day for 2 weeks and then PRN Patient not taking: Reported on 05/08/2023    [provider]   Social History   Socioeconomic History   Marital status: Widowed    Spouse name: Not on file   Number of children: Not on file   Years of education: Not on file   Highest education level: 12th grade  Occupational History   Not on file  Tobacco Use   Smoking status: Former    Packs/day: .5    Types: Cigarettes   Smokeless tobacco: Never  Vaping Use   Vaping Use: Never used  Substance and Sexual Activity   Alcohol use: No   Drug use: Never   Sexual activity: Not on file  Other Topics Concern   Not on file  Social History Narrative   Three children, 10 grands   Lives with daughter and 4 grands.     Social Determinants of Health   Financial  Resource Strain: Patient Declined (05/07/2023)   Overall Financial Resource Strain (CARDIA)    Difficulty of Paying Living Expenses: Patient declined  Food Insecurity: Patient Declined (05/07/2023)   Hunger Vital Sign    Worried About Running Out of Food in the Last Year: Patient declined    Ran Out of Food in the Last Year: Patient declined  Transportation Needs: No Transportation Needs (05/07/2023)   PRAPARE - Administrator, Civil Service (Medical): No    Lack of Transportation (Non-Medical): No  Physical Activity: Not on file  Stress: Not on file  Social Connections: Unknown (05/07/2023)   Social Connection and Isolation Panel [NHANES]    Frequency of Communication with Friends and Family: Twice a week    Frequency of Social Gatherings with Friends and Family: Once a week    Attends Religious Services: Patient declined    Database administrator or Organizations: No  Attends Banker Meetings: Not on file    Marital Status: Widowed  Intimate Partner Violence: Not on file    Review of Systems  Per HPI Objective:   Vitals:   05/08/23 1612  BP: 120/70  Pulse: 65  SpO2: 98%  Weight: 210 lb 12.8 oz (95.6 kg)     Physical Exam Vitals reviewed.  Constitutional:      Appearance: Normal appearance. She is well-developed.  HENT:     Head: Normocephalic and atraumatic.     Right Ear: Tympanic membrane, ear canal and external ear normal. There is no impacted cerumen.     Left Ear: Tympanic membrane and external ear normal. There is no impacted cerumen.     Ears:     Comments: Small rounded area of cerumen at the external canal, removed with lighted curette, no complications.  Potential risks discussed prior and verbal consent for removal of this area. Eyes:     Conjunctiva/sclera: Conjunctivae normal.     Pupils: Pupils are equal, round, and reactive to light.  Neck:     Vascular: No carotid bruit.  Cardiovascular:     Rate and Rhythm: Normal rate and  regular rhythm.     Heart sounds: Normal heart sounds.  Pulmonary:     Effort: Pulmonary effort is normal.     Breath sounds: Normal breath sounds.  Abdominal:     Palpations: Abdomen is soft. There is no pulsatile mass.     Tenderness: There is no abdominal tenderness.  Musculoskeletal:     Right lower leg: No edema.     Left lower leg: No edema.  Skin:    General: Skin is warm and dry.  Neurological:     Mental Status: She is alert and oriented to person, place, and time.  Psychiatric:        Mood and Affect: Mood normal.        Behavior: Behavior normal.      Assessment & Plan:  Frances Conley is a 62 y.o. female . Palpitations - Plan: Ambulatory referral to Cardiology Chest pain, unspecified type - Plan: Ambulatory referral to Cardiology  -Palpitations have improved but still intermittent symptoms.  Still suspect component of situational stress, and improved with decrease caffeine intake.  Chest pain recurred as above but nonexertional, atypical symptoms noted, stress, likely stress cause but will refer to cardiology to determine if other testing or evaluation needed.  RTC/ER precautions given.  Vitamin D deficiency  -Mildly low but some decreased adherence to supplementation previously.  Now consistently dosing, recheck in 6 weeks with repeat labs.  Essential hypertension  -Stable, improved med adherence.  No med changes for now.  Situational stress  -Handout given on stress management, declines meeting with therapist/counseling at this time.  Excessive cerumen in left ear canal  -Noise in ear potentially was due to small area of cerumen.  Removed.  RTC precautions if recurrent. No orders of the defined types were placed in this encounter.  Patient Instructions  Continue to avoid caffeine as much as possible. See info on handout from about managing stress, but I will refer you to cardiology to discuss chest pain and heart palpitations.  Return to the clinic or go to the  nearest emergency room if any of your symptoms worsen or new symptoms occur.  Recheck in 6 weeks for repeat labs.   Managing Stress, Adult Feeling a certain amount of stress is normal. Stress helps our body and mind get ready to  deal with the demands of life. Stress hormones can motivate you to do well at work and meet your responsibilities. But severe or long-term (chronic) stress can affect your mental and physical health. Chronic stress puts you at higher risk for: Anxiety and depression. Other health problems such as digestive problems, muscle aches, heart disease, high blood pressure, and stroke. What are the causes? Common causes of stress include: Demands from work, such as deadlines, feeling overworked, or having long hours. Pressures at home, such as money issues, disagreements with a spouse, or parenting issues. Pressures from major life changes, such as divorce, moving, loss of a loved one, or chronic illness. You may be at higher risk for stress-related problems if you: Do not get enough sleep. Are in poor health. Do not have emotional support. Have a mental health disorder such as anxiety or depression. How to recognize stress Stress can make you: Have trouble sleeping. Feel sad, anxious, irritable, or overwhelmed. Lose your appetite. Overeat or want to eat unhealthy foods. Want to use drugs or alcohol. Stress can also cause physical symptoms, such as: Sore, tense muscles, especially in the shoulders and neck. Headaches. Trouble breathing. A faster heart rate. Stomach pain, nausea, or vomiting. Diarrhea or constipation. Trouble concentrating. Follow these instructions at home: Eating and drinking Eat a healthy diet. This includes: Eating foods that are high in fiber, such as beans, whole grains, and fresh fruits and vegetables. Limiting foods that are high in fat and processed sugars, such as fried or sweet foods. Do not skip meals or overeat. Drink enough fluid to  keep your urine pale yellow. Alcohol use Do not drink alcohol if: Your health care provider tells you not to drink. You are pregnant, may be pregnant, or are planning to become pregnant. Drinking alcohol is a way some people try to ease their stress. This can be dangerous, so if you drink alcohol: Limit how much you have to: 0-1 drink a day for women. 0-2 drinks a day for men. Know how much alcohol is in your drink. In the [place] one drink equals one 12 oz bottle of beer (355 mL), one 5 oz glass of wine (148 mL), or one 1 oz glass of hard liquor (44 mL). Activity  Include 30 minutes of exercise in your daily schedule. Exercise is a good stress reducer. Include time in your day for an activity that you find relaxing. Try taking a walk, going on a bike ride, reading a book, or listening to music. Schedule your time in a way that lowers stress, and keep a regular schedule. Focus on doing what is most important to get done. Lifestyle Identify the source of your stress and your reaction to it. See a therapist who can help you change unhelpful reactions. When there are stressful events: Talk about them with family, friends, or coworkers. Try to think realistically about stressful events and not ignore them or overreact. Try to find the positives in a stressful situation and not focus on the negatives. Cut back on responsibilities at work and home, if possible. Ask for help from friends or family members if you need it. Find ways to manage stress, such as: Mindfulness, meditation, or deep breathing. Yoga or tai chi. Progressive muscle relaxation. Spending time in nature. Doing art, playing music, or reading. Making time for fun activities. Spending time with family and friends. Get support from family, friends, or spiritual resources. General instructions Get enough sleep. Try to go to sleep and get up at  about the same time every day. Take over-the-counter and prescription medicines only  as told by your health care provider. Do not use any products that contain nicotine or tobacco. These products include cigarettes, chewing tobacco, and vaping devices, such as e-cigarettes. If you need help quitting, ask your health care provider. Do not use drugs or smoke to deal with stress. Keep all follow-up visits. This is important. Where to find support Talk with your health care provider about stress management or finding a support group. Find a therapist to work with you on your stress management techniques. Where to find more information [employer] on Mental Illness: www.nami.org American Psychological Association: [employer] Contact a health care provider if: Your stress symptoms get worse. You are unable to manage your stress at home. You are struggling to stop using drugs or alcohol. Get help right away if: You may be a danger to yourself or others. You have any thoughts of death or suicide. Get help right awayif you feel like you may hurt yourself or others, or have thoughts about taking your own life. Go to your nearest emergency room or: Call 911. Call the National Suicide Prevention Lifeline at 309-191-4968 or 988 in the [place] This is open 24 hours a day. Text the Crisis Text Line at 239-508-9274. Summary Feeling a certain amount of stress is normal, but severe or long-term (chronic) stress can affect your mental and physical health. Chronic stress can put you at higher risk for anxiety, depression, and other health problems such as digestive problems, muscle aches, heart disease, high blood pressure, and stroke. You may be at higher risk for stress-related problems if you do not get enough sleep, are in poor health, lack emotional support, or have a mental health disorder such as anxiety or depression. Identify the source of your stress and your reaction to it. Try talking about stressful events with family, friends, or coworkers, finding a coping method, or getting  support from spiritual resources. If you need more help, talk with your health care provider about finding a support group or a mental health therapist. This information is not intended to replace advice given to you by your health care provider. Make sure you discuss any questions you have with your health care provider. Document Revised: 06/17/2021 Document Reviewed: 06/15/2021 Elsevier Patient Education  2024 Elsevier Inc.     Signed,   Meredith Staggers, MD [facility], [facility] 05/08/23 5:26 PM

## 2023-05-08 NOTE — Patient Instructions (Addendum)
Continue to avoid caffeine as much as possible. See info on handout from about managing stress, but I will refer you to cardiology to discuss chest pain and heart palpitations.  Return to the clinic or go to the nearest emergency room if any of your symptoms worsen or new symptoms occur.  Recheck in 6 weeks for repeat labs.   Managing Stress, Adult Feeling a certain amount of stress is normal. Stress helps our body and mind get ready to deal with the demands of life. Stress hormones can motivate you to do well at work and meet your responsibilities. But severe or long-term (chronic) stress can affect your mental and physical health. Chronic stress puts you at higher risk for: Anxiety and depression. Other health problems such as digestive problems, muscle aches, heart disease, high blood pressure, and stroke. What are the causes? Common causes of stress include: Demands from work, such as deadlines, feeling overworked, or having long hours. Pressures at home, such as money issues, disagreements with a spouse, or parenting issues. Pressures from major life changes, such as divorce, moving, loss of a loved one, or chronic illness. You may be at higher risk for stress-related problems if you: Do not get enough sleep. Are in poor health. Do not have emotional support. Have a mental health disorder such as anxiety or depression. How to recognize stress Stress can make you: Have trouble sleeping. Feel sad, anxious, irritable, or overwhelmed. Lose your appetite. Overeat or want to eat unhealthy foods. Want to use drugs or alcohol. Stress can also cause physical symptoms, such as: Sore, tense muscles, especially in the shoulders and neck. Headaches. Trouble breathing. A faster heart rate. Stomach pain, nausea, or vomiting. Diarrhea or constipation. Trouble concentrating. Follow these instructions at home: Eating and drinking Eat a healthy diet. This includes: Eating foods that are high  in fiber, such as beans, whole grains, and fresh fruits and vegetables. Limiting foods that are high in fat and processed sugars, such as fried or sweet foods. Do not skip meals or overeat. Drink enough fluid to keep your urine pale yellow. Alcohol use Do not drink alcohol if: Your health care provider tells you not to drink. You are pregnant, may be pregnant, or are planning to become pregnant. Drinking alcohol is a way some people try to ease their stress. This can be dangerous, so if you drink alcohol: Limit how much you have to: 0-1 drink a day for women. 0-2 drinks a day for men. Know how much alcohol is in your drink. In the [place] one drink equals one 12 oz bottle of beer (355 mL), one 5 oz glass of wine (148 mL), or one 1 oz glass of hard liquor (44 mL). Activity  Include 30 minutes of exercise in your daily schedule. Exercise is a good stress reducer. Include time in your day for an activity that you find relaxing. Try taking a walk, going on a bike ride, reading a book, or listening to music. Schedule your time in a way that lowers stress, and keep a regular schedule. Focus on doing what is most important to get done. Lifestyle Identify the source of your stress and your reaction to it. See a therapist who can help you change unhelpful reactions. When there are stressful events: Talk about them with family, friends, or coworkers. Try to think realistically about stressful events and not ignore them or overreact. Try to find the positives in a stressful situation and not focus on the negatives. Cut back  on responsibilities at work and home, if possible. Ask for help from friends or family members if you need it. Find ways to manage stress, such as: Mindfulness, meditation, or deep breathing. Yoga or tai chi. Progressive muscle relaxation. Spending time in nature. Doing art, playing music, or reading. Making time for fun activities. Spending time with family and friends. Get  support from family, friends, or spiritual resources. General instructions Get enough sleep. Try to go to sleep and get up at about the same time every day. Take over-the-counter and prescription medicines only as told by your health care provider. Do not use any products that contain nicotine or tobacco. These products include cigarettes, chewing tobacco, and vaping devices, such as e-cigarettes. If you need help quitting, ask your health care provider. Do not use drugs or smoke to deal with stress. Keep all follow-up visits. This is important. Where to find support Talk with your health care provider about stress management or finding a support group. Find a therapist to work with you on your stress management techniques. Where to find more information [employer] on Mental Illness: www.nami.org American Psychological Association: [employer] Contact a health care provider if: Your stress symptoms get worse. You are unable to manage your stress at home. You are struggling to stop using drugs or alcohol. Get help right away if: You may be a danger to yourself or others. You have any thoughts of death or suicide. Get help right awayif you feel like you may hurt yourself or others, or have thoughts about taking your own life. Go to your nearest emergency room or: Call 911. Call the National Suicide Prevention Lifeline at (445) 886-9190 or 988 in the [place] This is open 24 hours a day. Text the Crisis Text Line at 9512583882. Summary Feeling a certain amount of stress is normal, but severe or long-term (chronic) stress can affect your mental and physical health. Chronic stress can put you at higher risk for anxiety, depression, and other health problems such as digestive problems, muscle aches, heart disease, high blood pressure, and stroke. You may be at higher risk for stress-related problems if you do not get enough sleep, are in poor health, lack emotional support, or have a mental  health disorder such as anxiety or depression. Identify the source of your stress and your reaction to it. Try talking about stressful events with family, friends, or coworkers, finding a coping method, or getting support from spiritual resources. If you need more help, talk with your health care provider about finding a support group or a mental health therapist. This information is not intended to replace advice given to you by your health care provider. Make sure you discuss any questions you have with your health care provider. Document Revised: 06/17/2021 Document Reviewed: 06/15/2021 Elsevier Patient Education  2024 [employer].

## 2023-05-25 ENCOUNTER — Other Ambulatory Visit: Payer: Self-pay | Admitting: Family Medicine

## 2023-05-25 ENCOUNTER — Telehealth: Payer: Self-pay | Admitting: Family Medicine

## 2023-05-25 DIAGNOSIS — I10 Essential (primary) hypertension: Secondary | ICD-10-CM

## 2023-05-25 DIAGNOSIS — E782 Mixed hyperlipidemia: Secondary | ICD-10-CM

## 2023-05-25 DIAGNOSIS — Z87898 Personal history of other specified conditions: Secondary | ICD-10-CM

## 2023-05-25 NOTE — Telephone Encounter (Signed)
Caller name: SIMONA OVERMAN  On DPR?: Yes  Call back number: [phone] (mobile)  Provider they see: Shade Flood, MD  Reason for call:  Pt called wanting to know when her meds will be ready? Meds aren't ready just put in 05/25/23 at 2pm approx.

## 2023-05-25 NOTE — Telephone Encounter (Signed)
I notified the pt that the refills have been sent to pharmacy.   I did explain to the pt that we typically have 48-72 hours to send in pharmacy

## 2023-06-14 NOTE — Progress Notes (Deleted)
Cardiology Clinic Note   Patient Name: Frances Conley Date of Encounter: 06/14/2023  Primary Care Provider:  Shade Flood, MD Primary Cardiologist:  Rollene Rotunda, MD  Patient Profile    62 year old female with history of hypertension, chronic dyspnea on exertion on Lasix, medical nonadherence due to memory issues, chronic lower extremity edema.  Most recent echocardiogram in 2022 revealed normal LV systolic function of 60 to 65%, there was mild concentric left ventricular hypertrophy and grade 1 diastolic dysfunction.  Past Medical History    Past Medical History:  Diagnosis Date   Arthritis    both hips, hands, shoulders goes to Dr Darrelyn Hillock, [facility]   [id number](478.2)    Hypercholesteremia    Hypertension    Ulcer    Past Surgical History:  Procedure Laterality Date   CESAREAN SECTION     1981, 1986, 1984   TUBAL LIGATION     UTERINE FIBROID SURGERY      Allergies  No Known Allergies  History of Present Illness    ***  Home Medications    Current Outpatient Medications  Medication Sig Dispense Refill   benzonatate (TESSALON) 100 MG capsule Take 1 capsule (100 mg total) by mouth every 8 (eight) hours. (Patient not taking: Reported on 05/08/2023) 21 capsule 0   Cholecalciferol (VITAMIN D3) 50 MCG (2000 UT) CAPS Take by mouth.     Elastic Bandages & Supports (TRUFORM STOCKINGS 10-20MMHG) MISC 1 application by Does not apply route daily. Low intensity compression stockings, knee-high.  Okay to substitute with different brand as needed for insurance coverage.  Dispense 2 pairs. 2 each 0   hydrochlorothiazide (HYDRODIURIL) 25 MG tablet Take 1 tablet by mouth once daily 60 tablet 0   metaxalone (SKELAXIN) 800 MG tablet Take by mouth.     omeprazole (PRILOSEC) 20 MG capsule Take 1 capsule by mouth once daily 87 capsule 0   potassium chloride (KLOR-CON) 10 MEQ tablet Take 2 tablets (20 mEq total) by mouth daily. 180 tablet 3   simvastatin (ZOCOR) 40 MG tablet Take  1 tablet by mouth once daily 90 tablet 0   No current facility-administered medications for this visit.     Family History    Family History  Problem Relation Age of Onset   Heart failure Mother    Cancer Mother    Diabetes Mother    Hyperlipidemia Mother    Hypertension Mother    Thyroid disease Mother    Heart failure Father    Chronic Renal Failure Father    Diabetes Father    Kidney disease Father    Hyperlipidemia Father    Hypertension Father    Heart failure Sister    Diabetes Sister    Heart failure Brother    She indicated that her mother is deceased. She indicated that her father is deceased. She indicated that her sister is deceased. She indicated that the status of her brother is unknown.  Social History    Social History   Socioeconomic History   Marital status: Widowed    Spouse name: Not on file   Number of children: Not on file   Years of education: Not on file   Highest education level: 12th grade  Occupational History   Not on file  Tobacco Use   Smoking status: Former    Packs/day: .5    Types: Cigarettes   Smokeless tobacco: Never  Vaping Use   Vaping Use: Never used  Substance and Sexual Activity  Alcohol use: No   Drug use: Never   Sexual activity: Not on file  Other Topics Concern   Not on file  Social History Narrative   Three children, 10 grands   Lives with daughter and 4 grands.     Social Determinants of Health   Financial Resource Strain: Patient Declined (05/07/2023)   Overall Financial Resource Strain (CARDIA)    Difficulty of Paying Living Expenses: Patient declined  Food Insecurity: Patient Declined (05/07/2023)   Hunger Vital Sign    Worried About Running Out of Food in the Last Year: Patient declined    Ran Out of Food in the Last Year: Patient declined  Transportation Needs: No Transportation Needs (05/07/2023)   PRAPARE - Administrator, Civil Service (Medical): No    Lack of Transportation (Non-Medical): No   Physical Activity: Not on file  Stress: Not on file  Social Connections: Unknown (05/07/2023)   Social Connection and Isolation Panel [NHANES]    Frequency of Communication with Friends and Family: Twice a week    Frequency of Social Gatherings with Friends and Family: Once a week    Attends Religious Services: Patient declined    Database administrator or Organizations: No    Attends Engineer, structural: Not on file    Marital Status: Widowed  Intimate Partner Violence: Not on file     Review of Systems    General:  No chills, fever, night sweats or weight changes.  Cardiovascular:  No chest pain, dyspnea on exertion, edema, orthopnea, palpitations, paroxysmal nocturnal dyspnea. Dermatological: No rash, lesions/masses Respiratory: No cough, dyspnea Urologic: No hematuria, dysuria Abdominal:   No nausea, vomiting, diarrhea, bright red blood per rectum, melena, or hematemesis Neurologic:  No visual changes, wkns, changes in mental status. All other systems reviewed and are otherwise negative except as noted above.       Physical Exam    VS:  There were no vitals taken for this visit. , BMI There is no height or weight on file to calculate BMI.     GEN: Well nourished, well developed, in no acute distress. HEENT: normal. Neck: Supple, no JVD, carotid bruits, or masses. Cardiac: RRR, no murmurs, rubs, or gallops. No clubbing, cyanosis, edema.  Radials/DP/PT 2+ and equal bilaterally.  Respiratory:  Respirations regular and unlabored, clear to auscultation bilaterally. GI: Soft, nontender, nondistended, BS + x 4. MS: no deformity or atrophy. Skin: warm and dry, no rash. Neuro:  Strength and sensation are intact. Psych: Normal affect.      Lab Results  Component Value Date   WBC 8.3 03/29/2023   HGB 14.0 03/29/2023   HCT 41.9 03/29/2023   MCV 83.6 03/29/2023   PLT 213 03/29/2023   Lab Results  Component Value Date   CREATININE 0.84 04/17/2023   BUN 12  04/17/2023   NA 141 04/17/2023   K 4.0 04/17/2023   CL 104 04/17/2023   CO2 28 04/17/2023   Lab Results  Component Value Date   ALT 34 04/17/2023   AST 34 04/17/2023   ALKPHOS 98 04/17/2023   BILITOT 0.5 04/17/2023   Lab Results  Component Value Date   CHOL 151 04/17/2023   HDL 33.60 (L) 04/17/2023   LDLCALC 96 04/17/2023   TRIG 107.0 04/17/2023   CHOLHDL 4 04/17/2023    No results found for: "HGBA1C"   Review of Prior Studies    Echocardiogram 05/03/2022 1. Left ventricular ejection fraction, by estimation, is 60 to  65%. The  left ventricle has normal function. The left ventricle has no regional  wall motion abnormalities. There is mild concentric left ventricular  hypertrophy. Left ventricular diastolic  parameters are consistent with Grade I diastolic dysfunction (impaired  relaxation).   2. Right ventricular systolic function is normal. The right ventricular  size is normal. There is normal pulmonary artery systolic pressure.   3. Left atrial size was mildly dilated.   4. The mitral valve is normal in structure. Trivial mitral valve  regurgitation. No evidence of mitral stenosis.   5. The aortic valve is tricuspid. Aortic valve regurgitation is not  visualized. No aortic stenosis is present.   6. The inferior vena cava is normal in size with greater than 50%  respiratory variability, suggesting right atrial pressure of 3 mmHg.    Assessment & Plan   1.  ***     {Are you ordering a CV Procedure (e.g. stress test, cath, DCCV, TEE, etc)?   Press F2        :[id number]}   Signed, Bettey Mare. Liborio Nixon, ANP, AACC   06/14/2023 12:46 PM      Office [phone] Fax [phone]  Notice: This dictation was prepared with Dragon dictation along with smaller phrase technology. Any transcriptional errors that result from this process are unintentional and may not be corrected upon review.

## 2023-06-16 ENCOUNTER — Ambulatory Visit: Payer: PRIVATE HEALTH INSURANCE | Admitting: Adult Health

## 2023-06-23 ENCOUNTER — Ambulatory Visit: Payer: PRIVATE HEALTH INSURANCE | Admitting: Family Medicine

## 2023-07-26 NOTE — Progress Notes (Unsigned)
Cardiology Clinic Note   Patient Name: Frances Conley Date of Encounter: 07/27/2023  Primary Care Provider:  Shade Flood, MD Primary Cardiologist:  Rollene Rotunda, MD  Patient Profile    62 year old female patient with history of hypercholesterolemia, hypertension, mild to moderate LVH per echo.  Lasix improved symptoms.  GXT was planned on last office visit, but there has been no report concerning results.  She is being seen annually and is due for echocardiogram this office visit.  Past Medical History    Past Medical History:  Diagnosis Date   Arthritis    both hips, hands, shoulders goes to Dr Darrelyn Hillock[facility]   [id number](540.9)    Hypercholesteremia    Hypertension    Ulcer    Past Surgical History:  Procedure Laterality Date   CESAREAN SECTION     1981, 1986, 1984   TUBAL LIGATION     UTERINE FIBROID SURGERY      Allergies  No Known Allergies  History of Present Illness    Frances Conley presents to the office today for ongoing assessment and management of chronic lower extremity edema, hypertension, and hyperlipidemia.  Was seen by her primary care provider on 05/08/2023 with complaints of palpitations and she has been referred back to cardiology for evaluation.  She is advised on decreased caffeine intake.  She was also noted to be under situational stress which may have been contributing.  She comes today with complaints of fatigue and consistent weight gain.  She states that she is drinking a lot of Pepsi's during the day and also eating a lot of sugary snacks throughout the day.  She states she eats a lot of the processed snack bakery items.  She states that she is addicted to sugar and is having trouble stopping eating the snacks.  As result of the weight gain she continues to have dyspnea on exertion.  Home Medications    Current Outpatient Medications  Medication Sig Dispense Refill   Cholecalciferol (VITAMIN D3) 50 MCG (2000 UT) CAPS Take by mouth.      Elastic Bandages & Supports (TRUFORM STOCKINGS 10-20MMHG) MISC 1 application by Does not apply route daily. Low intensity compression stockings, knee-high.  Okay to substitute with different brand as needed for insurance coverage.  Dispense 2 pairs. 2 each 0   hydrochlorothiazide (HYDRODIURIL) 25 MG tablet Take 1 tablet (25 mg total) by mouth daily. 60 tablet 0   metaxalone (SKELAXIN) 800 MG tablet Take by mouth.     omeprazole (PRILOSEC) 20 MG capsule Take 1 capsule (20 mg total) by mouth daily. 87 capsule 0   simvastatin (ZOCOR) 40 MG tablet Take 1 tablet (40 mg total) by mouth daily. 90 tablet 0   benzonatate (TESSALON) 100 MG capsule Take 1 capsule (100 mg total) by mouth every 8 (eight) hours. (Patient not taking: Reported on 07/27/2023) 21 capsule 0   potassium chloride (KLOR-CON) 10 MEQ tablet Take 2 tablets (20 mEq total) by mouth daily. 180 tablet 3   No current facility-administered medications for this visit.     Family History    Family History  Problem Relation Age of Onset   Heart failure Mother    Cancer Mother    Diabetes Mother    Hyperlipidemia Mother    Hypertension Mother    Thyroid disease Mother    Heart failure Father    Chronic Renal Failure Father    Diabetes Father    Kidney disease Father    Hyperlipidemia Father  Hypertension Father    Heart failure Sister    Diabetes Sister    Heart failure Brother    She indicated that her mother is deceased. She indicated that her father is deceased. She indicated that her sister is deceased. She indicated that the status of her brother is unknown.  Social History    Social History   Socioeconomic History   Marital status: Widowed    Spouse name: Not on file   Number of children: Not on file   Years of education: Not on file   Highest education level: 12th grade  Occupational History   Not on file  Tobacco Use   Smoking status: Former    Current packs/day: 0.50    Types: Cigarettes   Smokeless  tobacco: Never  Vaping Use   Vaping status: Never Used  Substance and Sexual Activity   Alcohol use: No   Drug use: Never   Sexual activity: Not on file  Other Topics Concern   Not on file  Social History Narrative   Three children, 10 grands   Lives with daughter and 4 grands.     Social Determinants of Health   Financial Resource Strain: Patient Declined (05/07/2023)   Overall Financial Resource Strain (CARDIA)    Difficulty of Paying Living Expenses: Patient declined  Food Insecurity: Patient Declined (05/07/2023)   Hunger Vital Sign    Worried About Running Out of Food in the Last Year: Patient declined    Ran Out of Food in the Last Year: Patient declined  Transportation Needs: No Transportation Needs (05/07/2023)   PRAPARE - Administrator, Civil Service (Medical): No    Lack of Transportation (Non-Medical): No  Physical Activity: Not on file  Stress: Not on file  Social Connections: Unknown (05/07/2023)   Social Connection and Isolation Panel [NHANES]    Frequency of Communication with Friends and Family: Twice a week    Frequency of Social Gatherings with Friends and Family: Once a week    Attends Religious Services: Patient declined    Database administrator or Organizations: No    Attends Engineer, structural: Not on file    Marital Status: Widowed  Catering manager Violence: Not on file     Review of Systems    General:  No chills, fever, night sweats, positive for weight gain  Cardiovascular:  No chest pain, positive for dyspnea on exertion, edema, orthopnea, palpitations, paroxysmal nocturnal dyspnea. Dermatological: No rash, lesions/masses Respiratory: No cough, dyspnea Urologic: No hematuria, dysuria Abdominal:   No nausea, vomiting, diarrhea, bright red blood per rectum, melena, or hematemesis Neurologic:  No visual changes, wkns, changes in mental status. All other systems reviewed and are otherwise negative except as noted above.  EKG  Interpretation Date/Time:  Thursday July 27 2023 15:36:00 EDT Ventricular Rate:  68 PR Interval:  160 QRS Duration:  78 QT Interval:  396 QTC Calculation: 421 R Axis:   35  Text Interpretation: Normal sinus rhythm Cannot rule out Anterior infarct , age undetermined When compared with ECG of 29-Mar-2023 01:25, PREVIOUS ECG IS PRESENT Confirmed by Joni Reining [phone]) on 07/27/2023 5:40:31 PM    Physical Exam    VS:  BP 124/82   Pulse 71   Ht 5\' 3"  (1.6 m)   Wt 211 lb 6.4 oz (95.9 kg)   SpO2 99%   BMI 37.45 kg/m  , BMI Body mass index is 37.45 kg/m.     GEN: Well nourished, well  developed, in no acute distress.  Obese HEENT: normal. Neck: Supple, no JVD, carotid bruits, or masses. Cardiac: RRR, no murmurs, rubs, or gallops. No clubbing, cyanosis, nonpitting edema.  Radials/DP/PT 2+ and equal bilaterally.  Respiratory:  Respirations regular and unlabored, clear to auscultation bilaterally. GI: Soft, nontender, nondistended, BS + x 4.  Central obesity MS: no deformity or atrophy. Skin: warm and dry, no rash. Neuro:  Strength and sensation are intact. Psych: Normal affect.  EKG Interpretation Date/Time:  Thursday July 27 2023 15:36:00 EDT Ventricular Rate:  68 PR Interval:  160 QRS Duration:  78 QT Interval:  396 QTC Calculation: 421 R Axis:   35  Text Interpretation: Normal sinus rhythm Cannot rule out Anterior infarct , age undetermined When compared with ECG of 29-Mar-2023 01:25, PREVIOUS ECG IS PRESENT Confirmed by Joni Reining [phone]) on 07/27/2023 5:40:31 PM   Lab Results  Component Value Date   WBC 8.3 03/29/2023   HGB 14.0 03/29/2023   HCT 41.9 03/29/2023   MCV 83.6 03/29/2023   PLT 213 03/29/2023   Lab Results  Component Value Date   CREATININE 0.84 04/17/2023   BUN 12 04/17/2023   NA 141 04/17/2023   K 4.0 04/17/2023   CL 104 04/17/2023   CO2 28 04/17/2023   Lab Results  Component Value Date   ALT 34 04/17/2023   AST 34 04/17/2023    ALKPHOS 98 04/17/2023   BILITOT 0.5 04/17/2023   Lab Results  Component Value Date   CHOL 151 04/17/2023   HDL 33.60 (L) 04/17/2023   LDLCALC 96 04/17/2023   TRIG 107.0 04/17/2023   CHOLHDL 4 04/17/2023    No results found for: "HGBA1C"   Review of Prior Studies EKG Interpretation Date/Time:  Thursday July 27 2023 15:36:00 EDT Ventricular Rate:  68 PR Interval:  160 QRS Duration:  78 QT Interval:  396 QTC Calculation: 421 R Axis:   35  Text Interpretation: Normal sinus rhythm Cannot rule out Anterior infarct , age undetermined When compared with ECG of 29-Mar-2023 01:25, PREVIOUS ECG IS PRESENT Confirmed by Joni Reining [phone]) on 07/27/2023 5:40:31 PM  Echocardiogram 05/04/2023  1. Left ventricular ejection fraction, by estimation, is 60 to 65%. The  left ventricle has normal function. The left ventricle has no regional  wall motion abnormalities. There is mild concentric left ventricular  hypertrophy. Left ventricular diastolic  parameters are consistent with Grade I diastolic dysfunction (impaired  relaxation).   2. Right ventricular systolic function is normal. The right ventricular  size is normal. There is normal pulmonary artery systolic pressure.   3. Left atrial size was mildly dilated.   4. The mitral valve is normal in structure. Trivial mitral valve  regurgitation. No evidence of mitral stenosis.   5. The aortic valve is tricuspid. Aortic valve regurgitation is not  visualized. No aortic stenosis is present.   6. The inferior vena cava is normal in size with greater than 50%  respiratory variability, suggesting right atrial pressure of 3 mmHg.    Assessment & Plan   1.  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  2.  Obesity due to excess calorie intake: Admits to  drinking several Pepsi's a day, and eating a lot of snack bakery items throughout the day.  She states that she is addicted to sugar  and cannot stop eating.  She goes through a package of brownies every 2 days, along with little Debbie snack cakes a package every 2 days.  She states she is going to try and cut back on the Pepsi's.  She is frustrated with her weight gain and realizes that this is related to her excess calorie intake.  I have advised her to do her best to reduce this intake of sugary snacks and soft drinks.  Checking hemoglobin A1c, TSH and CMET today.  Information on primary care providers in [place] are provided to her        Signed, Bettey Mare. Liborio Nixon, ANP, AACC   07/27/2023 5:41 PM      Office [phone] Fax [phone]  Notice: This dictation was prepared with Dragon dictation along with smaller phrase technology. Any transcriptional errors that result from this process are unintentional and may not be corrected upon review.

## 2023-07-27 ENCOUNTER — Telehealth: Payer: Self-pay | Admitting: Family Medicine

## 2023-07-27 ENCOUNTER — Other Ambulatory Visit: Payer: Self-pay | Admitting: Family Medicine

## 2023-07-27 ENCOUNTER — Encounter: Payer: Self-pay | Admitting: Adult Health

## 2023-07-27 ENCOUNTER — Ambulatory Visit: Payer: PRIVATE HEALTH INSURANCE | Attending: Adult Health | Admitting: Adult Health

## 2023-07-27 ENCOUNTER — Other Ambulatory Visit: Payer: Self-pay

## 2023-07-27 VITALS — BP 124/82 | HR 71 | Ht 63.0 in | Wt 211.4 lb

## 2023-07-27 DIAGNOSIS — E782 Mixed hyperlipidemia: Secondary | ICD-10-CM

## 2023-07-27 DIAGNOSIS — R0609 Other forms of dyspnea: Secondary | ICD-10-CM

## 2023-07-27 DIAGNOSIS — E669 Obesity, unspecified: Secondary | ICD-10-CM | POA: Diagnosis not present

## 2023-07-27 DIAGNOSIS — I10 Essential (primary) hypertension: Secondary | ICD-10-CM

## 2023-07-27 DIAGNOSIS — Z87898 Personal history of other specified conditions: Secondary | ICD-10-CM

## 2023-07-27 MED ORDER — OMEPRAZOLE 20 MG PO CPDR
20.0000 mg | DELAYED_RELEASE_CAPSULE | Freq: Every day | ORAL | 0 refills | Status: DC
Start: 2023-07-27 — End: 2024-03-27

## 2023-07-27 MED ORDER — SIMVASTATIN 40 MG PO TABS
40.0000 mg | ORAL_TABLET | Freq: Every day | ORAL | 0 refills | Status: DC
Start: 2023-07-27 — End: 2023-12-25

## 2023-07-27 MED ORDER — HYDROCHLOROTHIAZIDE 25 MG PO TABS
25.0000 mg | ORAL_TABLET | Freq: Every day | ORAL | 0 refills | Status: DC
Start: 2023-07-27 — End: 2023-10-20

## 2023-07-27 MED ORDER — POTASSIUM CHLORIDE ER 10 MEQ PO TBCR
20.0000 meq | EXTENDED_RELEASE_TABLET | Freq: Every day | ORAL | 3 refills | Status: DC
Start: 2023-07-27 — End: 2024-07-26

## 2023-07-27 NOTE — Patient Instructions (Signed)
Medication Instructions:  NO CHANGES *If you need a refill on your cardiac medications before your next appointment, please call your pharmacy*   Lab Work: CMET, HEMOGLOBIN A1C,TSH TODAY. If you have labs (blood work) drawn today and your tests are completely normal, you will receive your results only by: MyChart Message (if you have MyChart) OR A paper copy in the mail If you have any lab test that is abnormal or we need to change your treatment, we will call you to review the results.   Testing/Procedures: NO TESTING   Follow-Up: At [facility], you and your health needs are our priority.  As part of our continuing mission to provide you with exceptional heart care, we have created designated Provider Care Teams.  These Care Teams include your primary Cardiologist (physician) and Advanced Practice Providers (APPs -  Physician Assistants and Nurse Practitioners) who all work together to provide you with the care you need, when you need it.  Your next appointment:   3 month(s)  Provider:   Joni Reining, DNP, ANP

## 2023-07-27 NOTE — Telephone Encounter (Signed)
Refills sent

## 2023-07-27 NOTE — Telephone Encounter (Signed)
Encourage patient to contact the pharmacy for refills or they can request refills through [facility]   WHAT PHARMACY WOULD THEY LIKE THIS SENT TO:  [facility] [address] [place], [place] - [address]   MEDICATION NAME & DOSE: omeprazole (PRILOSEC) 20 MG capsule hydrochlorothiazide (HYDRODIURIL) 25 MG tablet simvastatin (ZOCOR) 40 MG tablet  NOTES/COMMENTS FROM PATIENT:      Front office please notify patient: It takes 48-72 hours to process rx refill requests Ask patient to call pharmacy to ensure rx is ready before heading there.

## 2023-07-28 LAB — COMPREHENSIVE METABOLIC PANEL
ALT: 46 IU/L — ABNORMAL HIGH (ref 0–32)
AST: 46 IU/L — ABNORMAL HIGH (ref 0–40)
Albumin: 4.2 g/dL (ref 3.9–4.9)
Alkaline Phosphatase: 115 IU/L (ref 44–121)
BUN/Creatinine Ratio: 14 (ref 12–28)
BUN: 13 mg/dL (ref 8–27)
Bilirubin Total: 0.5 mg/dL (ref 0.0–1.2)
CO2: 22 mmol/L (ref 20–29)
Calcium: 9.3 mg/dL (ref 8.7–10.3)
Chloride: 103 mmol/L (ref 96–106)
Creatinine, Ser: 0.9 mg/dL (ref 0.57–1.00)
Globulin, Total: 3.1 g/dL (ref 1.5–4.5)
Glucose: 128 mg/dL — ABNORMAL HIGH (ref 70–99)
Potassium: 3.8 mmol/L (ref 3.5–5.2)
Sodium: 141 mmol/L (ref 134–144)
Total Protein: 7.3 g/dL (ref 6.0–8.5)
eGFR: 73 mL/min/{1.73_m2} (ref >59)

## 2023-07-28 LAB — HEMOGLOBIN A1C
Est. average glucose Bld gHb Est-mCnc: 143 mg/dL
Hgb A1c MFr Bld: 6.6 % — ABNORMAL HIGH (ref 4.8–5.6)

## 2023-07-28 LAB — TSH: TSH: 0.577 u[IU]/mL (ref 0.450–4.500)

## 2023-08-01 ENCOUNTER — Telehealth: Payer: Self-pay

## 2023-08-01 NOTE — Telephone Encounter (Addendum)
Results viewed by patient via Mychart.----- Message from Joni Reining sent at 07/30/2023  9:52 AM EDT ----- I have reviewed her labs. The Hgb A1c is elevated. Should see PCP for this as she may need some assistance with blood glucose management. She admits to eating a lot of bakery snacks throughout the day and sugary soft drinks. She planned on stopping the soft drinks as she continues to gain weight. She should also try and stop the sugary snacks as well.    KL

## 2023-10-19 NOTE — Progress Notes (Deleted)
  Cardiology Office Note:  .   Date:  10/19/2023  ID:  Frances Conley, DOB [birth year], MRN [id number] PCP: Shade Flood, MD  [facility] Providers Cardiologist:  Rollene Rotunda, MD { }   History of Present Illness: .   Frances Conley is a 62 y.o. female hypercholesterolemia, hypertension, mild to moderate LVH per echo.  Lasix improved symptoms when last seen in the office on 07/27/2023 she was complaining of consistent weight gain and fatigue along with dyspnea on exertion.  She was advised to cut back on sugary drinks increase her exercise and labs are ordered to include hemoglobin A1c, TSH and CMET.  Hemoglobin A1c was elevated at 6.6, TSH was normal, she did have elevation in her AST and ALT.  She was advised to decrease her simvastatin to 20 mg daily.  ROS: ***  Studies Reviewed: .        *** EKG Interpretation Date/Time:    Ventricular Rate:    PR Interval:    QRS Duration:    QT Interval:    QTC Calculation:   R Axis:      Text Interpretation:      Physical Exam:   VS:  There were no vitals taken for this visit.   Wt Readings from Last 3 Encounters:  07/27/23 211 lb 6.4 oz (95.9 kg)  05/08/23 210 lb 12.8 oz (95.6 kg)  04/17/23 207 lb 12.8 oz (94.3 kg)    GEN: Well nourished, well developed in no acute distress NECK: No JVD; No carotid bruits CARDIAC: ***RRR, no murmurs, rubs, gallops RESPIRATORY:  Clear to auscultation without rales, wheezing or rhonchi  ABDOMEN: Soft, non-tender, non-distended EXTREMITIES:  No edema; No deformity   ASSESSMENT AND PLAN: .   ***    {Are you ordering a CV Procedure (e.g. stress test, cath, DCCV, TEE, etc)?   Press F2        :[id number]}    Signed, Bettey Mare. Liborio Nixon, ANP, AACC

## 2023-10-20 ENCOUNTER — Ambulatory Visit: Payer: No Typology Code available for payment source | Admitting: Adult Health

## 2023-10-20 ENCOUNTER — Other Ambulatory Visit: Payer: Self-pay | Admitting: Family Medicine

## 2023-10-20 DIAGNOSIS — I10 Essential (primary) hypertension: Secondary | ICD-10-CM

## 2023-12-06 IMAGING — DX DG CHEST 2V
2 series · 2 of 2 positions shown · non-contrast
Comparison: Chest x-ray [DATE].

CLINICAL DATA: Shortness of breath.

EXAM:
CHEST - 2 VIEW

[chest pa]
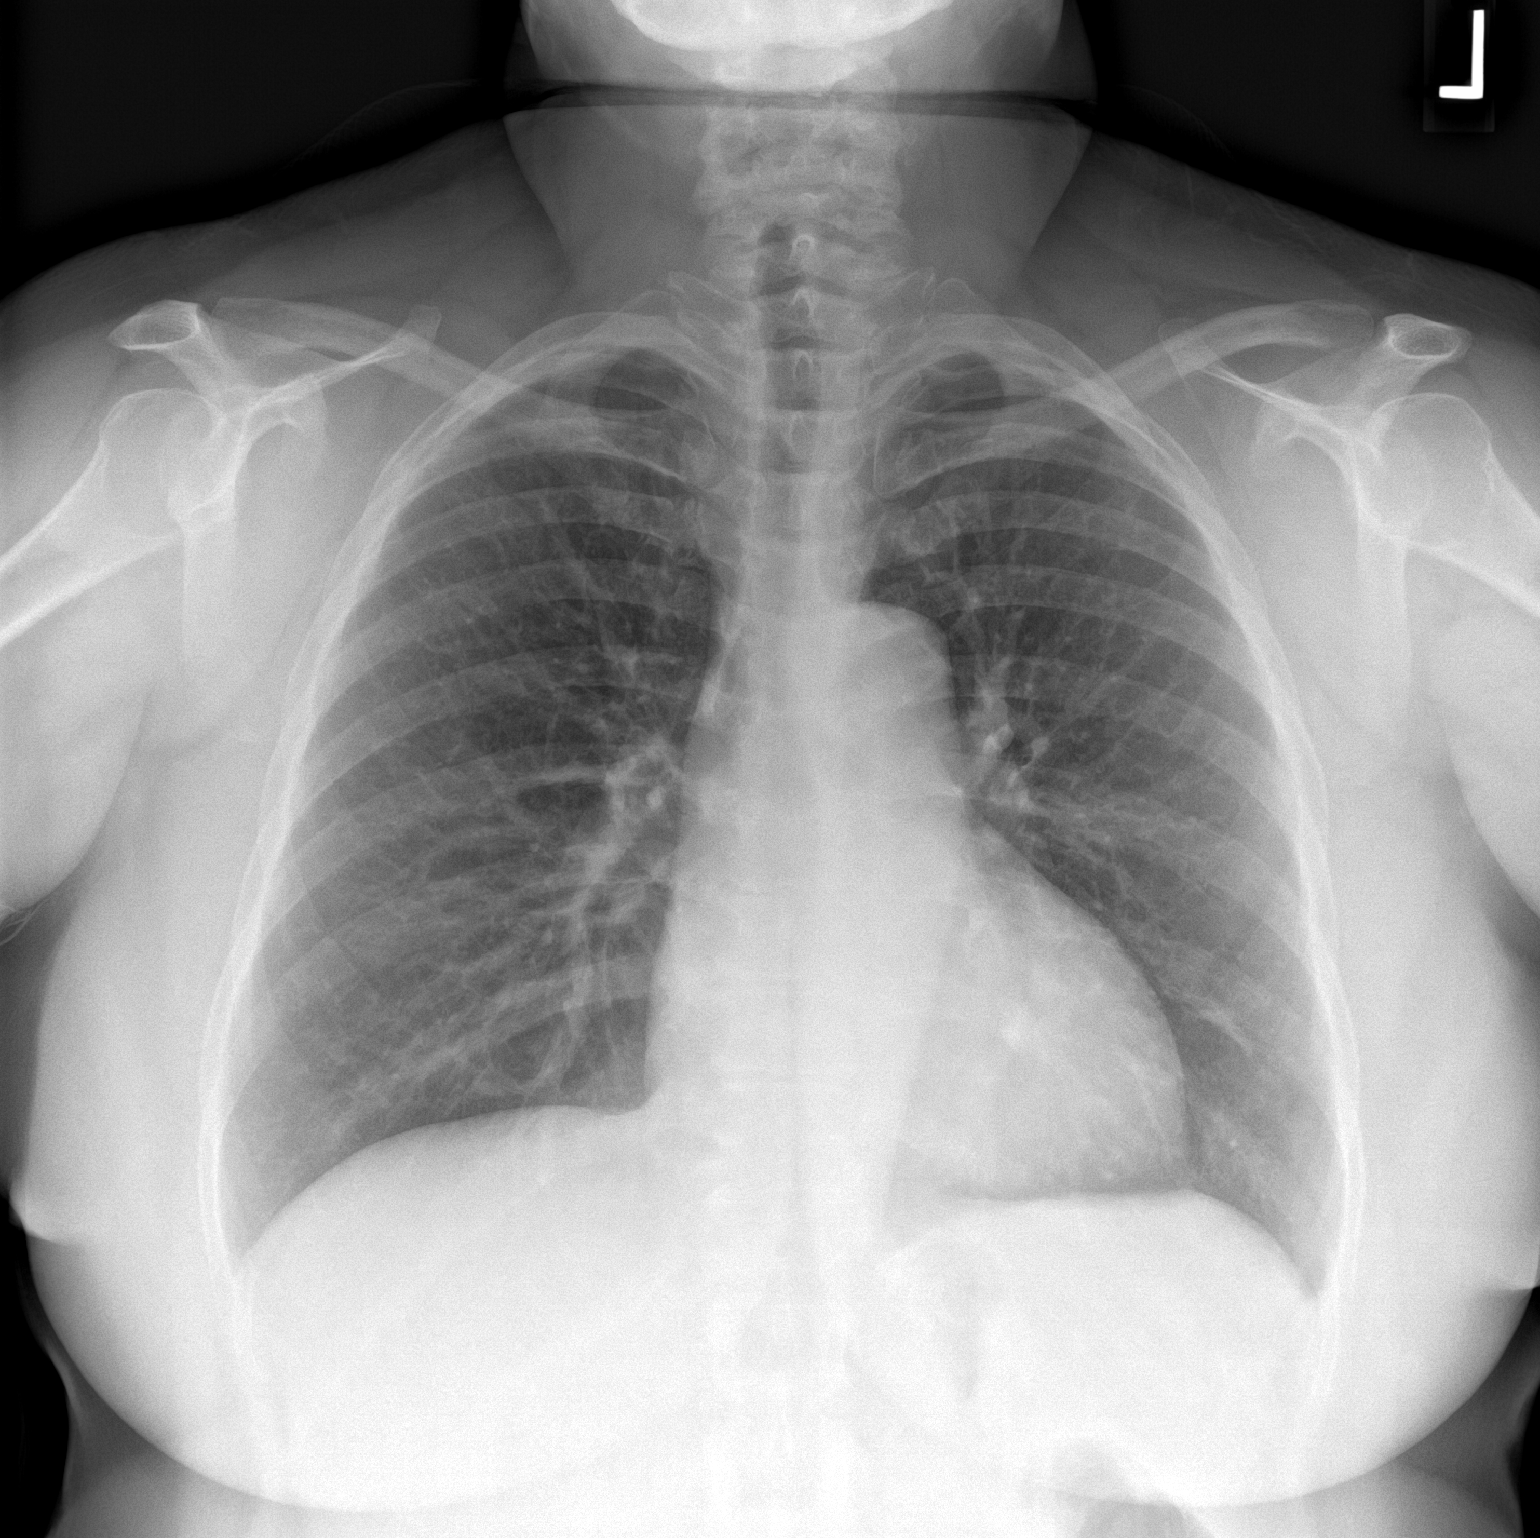

[chest lat]
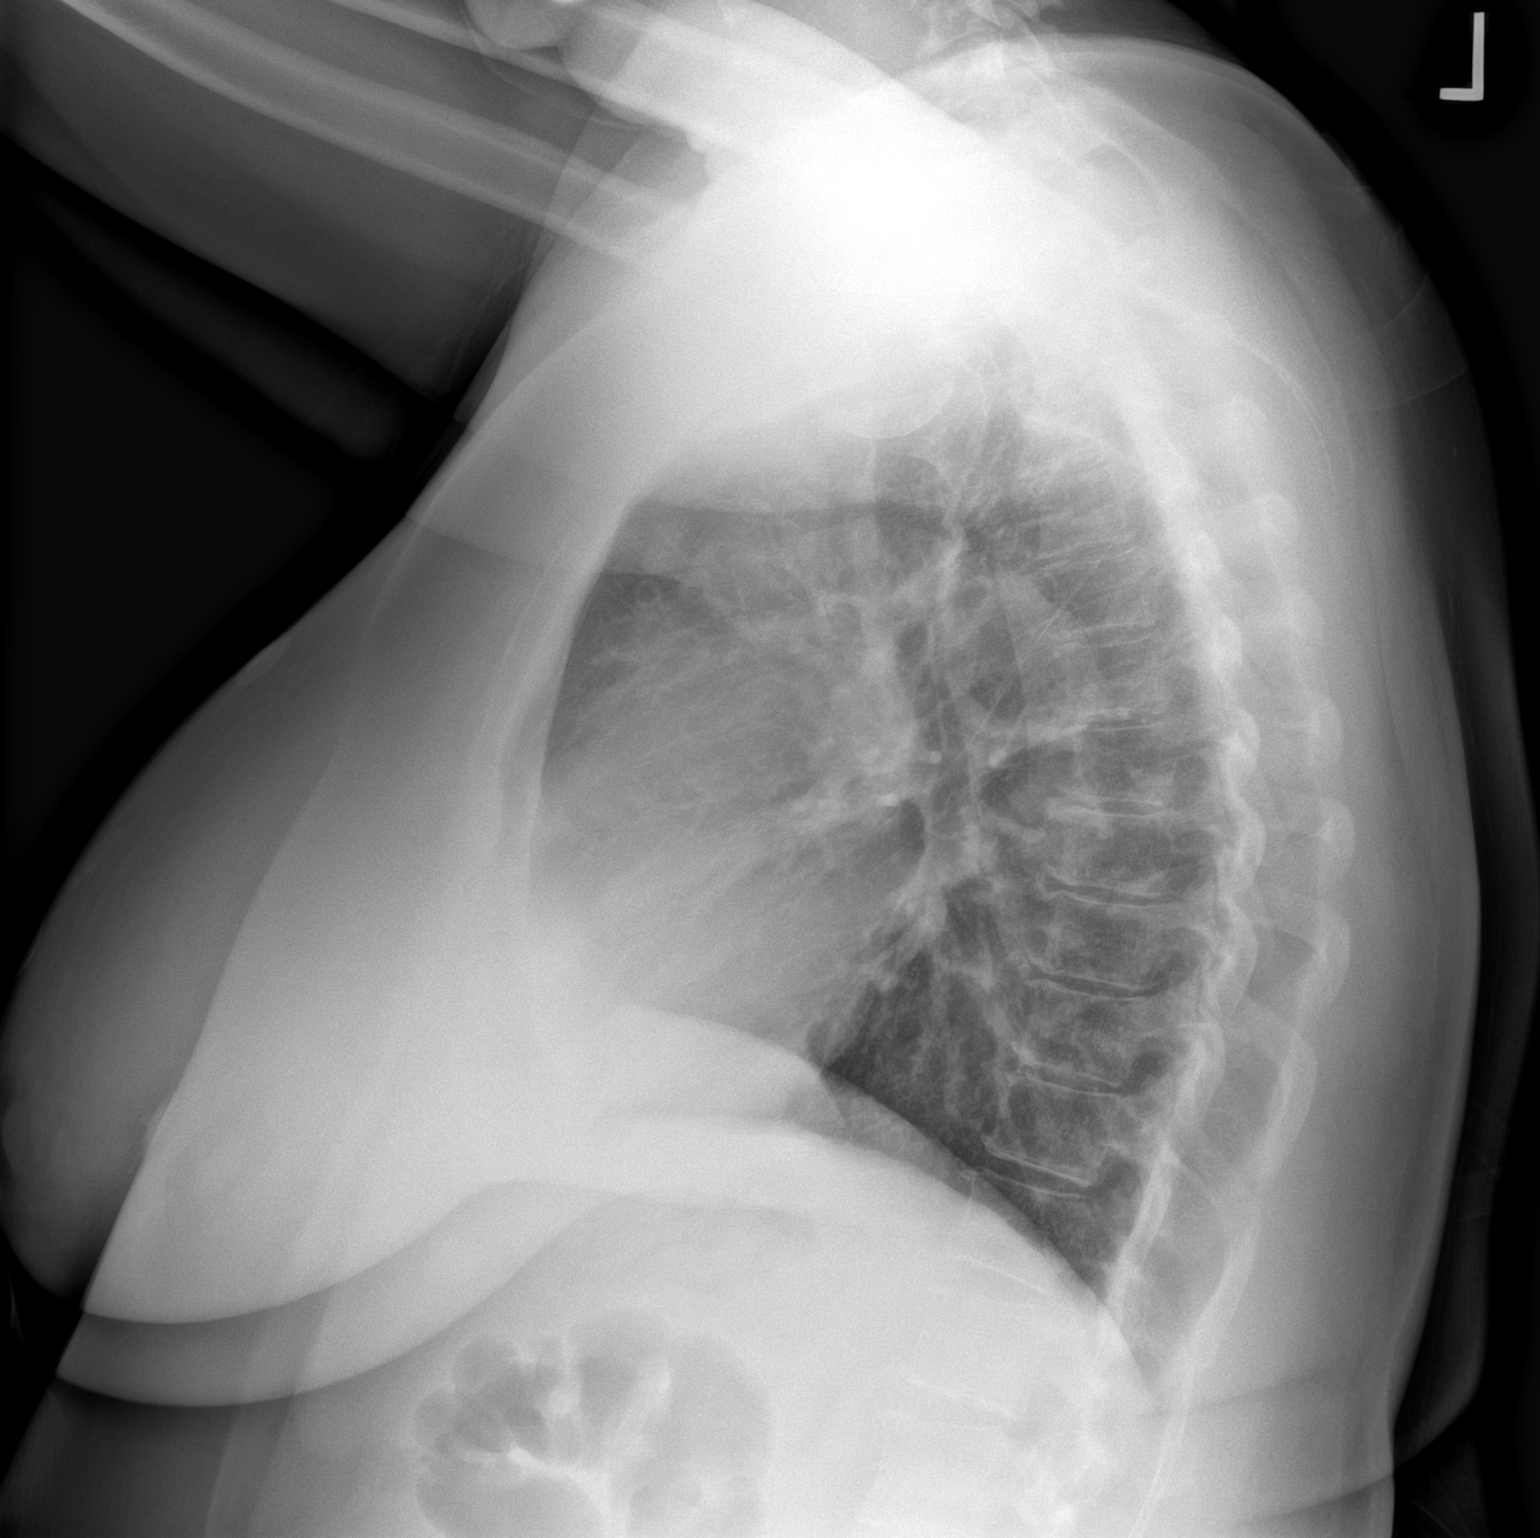

[2 of 2 positions shown; findings below may reference images not displayed]

FINDINGS: The heart size and mediastinal contours are within normal limits.
There is linear atelectasis or scarring in the left lower lung. The
lungs are otherwise clear. Visualized skeletal structures are
unremarkable.
IMPRESSION: No active cardiopulmonary disease.

## 2023-12-23 ENCOUNTER — Other Ambulatory Visit: Payer: Self-pay | Admitting: Family Medicine

## 2023-12-23 DIAGNOSIS — E782 Mixed hyperlipidemia: Secondary | ICD-10-CM

## 2023-12-31 ENCOUNTER — Other Ambulatory Visit: Payer: Self-pay | Admitting: Family Medicine

## 2023-12-31 DIAGNOSIS — E782 Mixed hyperlipidemia: Secondary | ICD-10-CM

## 2024-02-16 IMAGING — MG MM DIGITAL SCREENING BILAT W/ TOMO AND CAD
8 series · 8 of 24 positions shown · non-contrast
Comparison: Previous exam(s).

CLINICAL DATA: Screening.

EXAM:
DIGITAL SCREENING BILATERAL MAMMOGRAM WITH TOMOSYNTHESIS AND CAD
TECHNIQUE: Bilateral screening digital craniocaudal and mediolateral oblique
mammograms were obtained. Bilateral screening digital breast
tomosynthesis was performed. The images were evaluated with
computer-aided detection.

[R CC synth-2D]
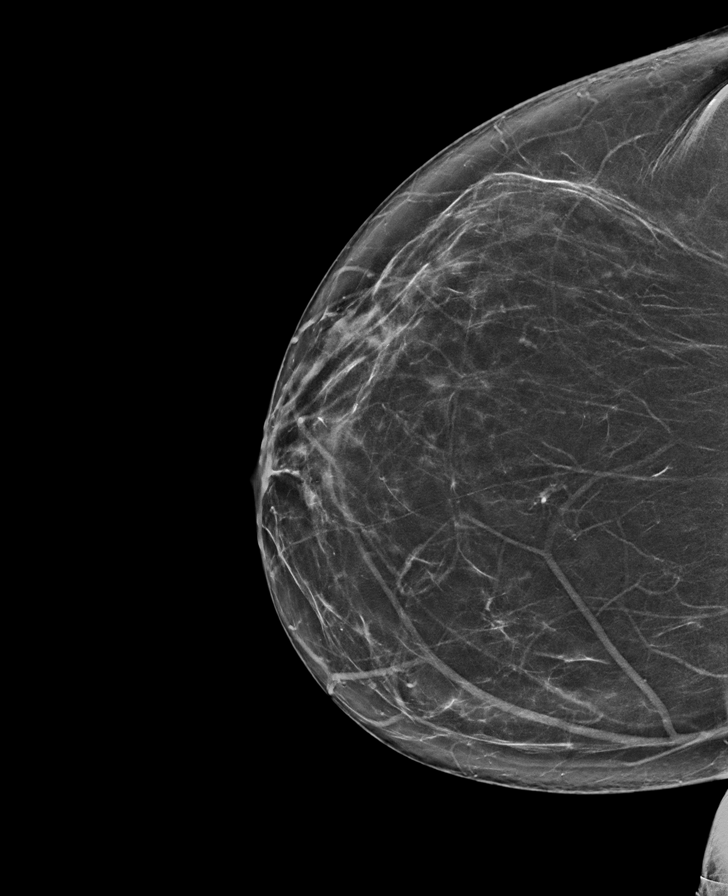

[L MLO synth-2D]
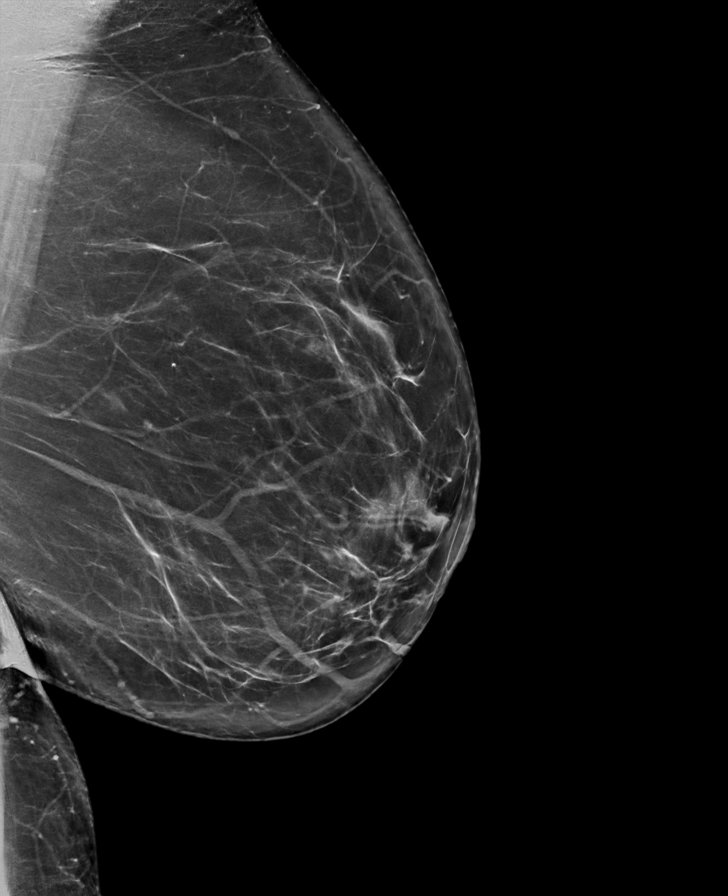

[R MLO synth-2D]
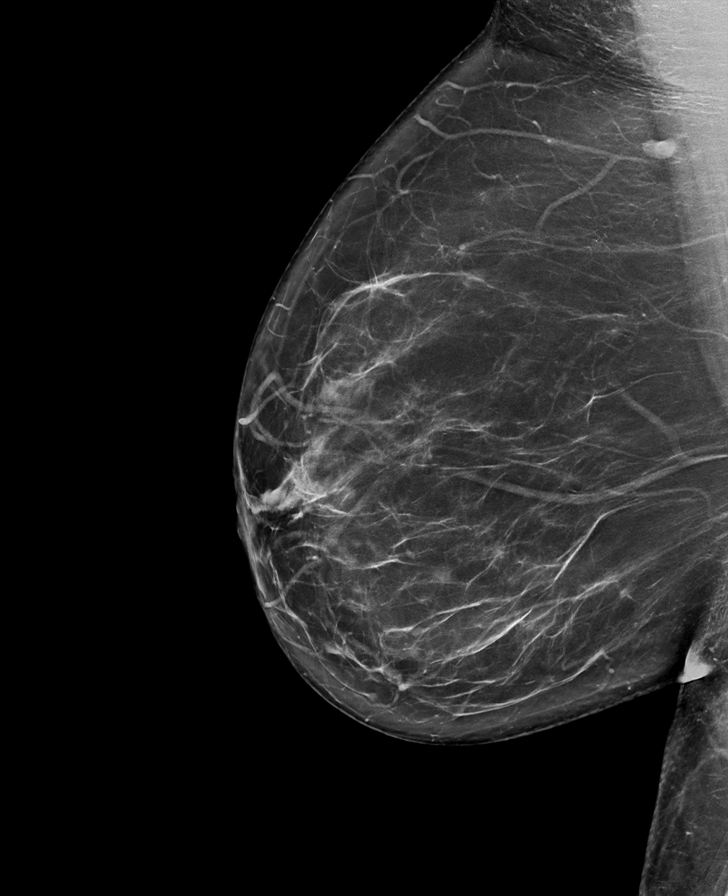

[L CC synth-2D]
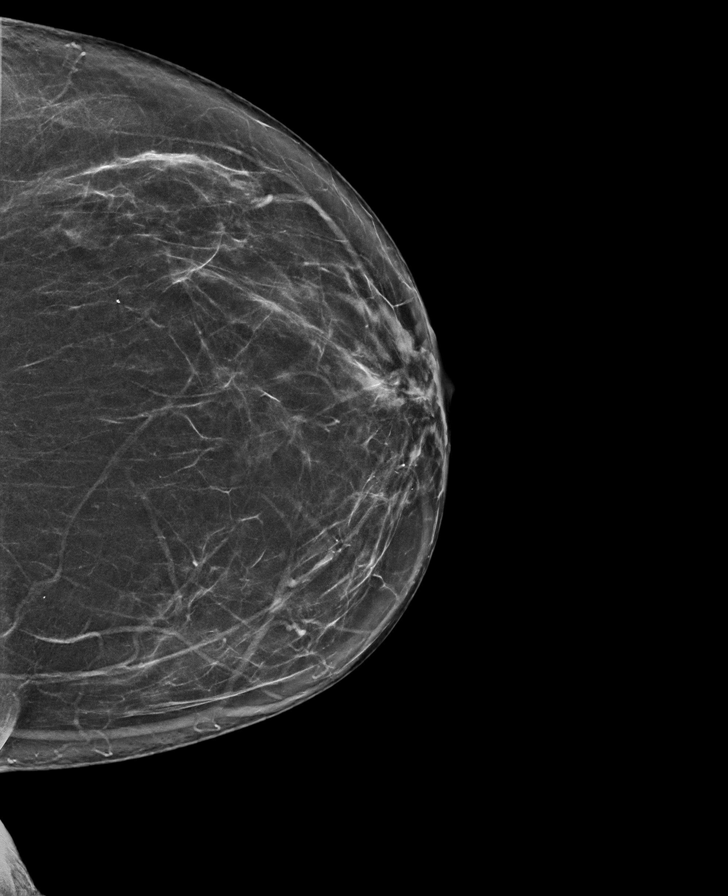

[L CC tomo · tomo slice 37/73.0]
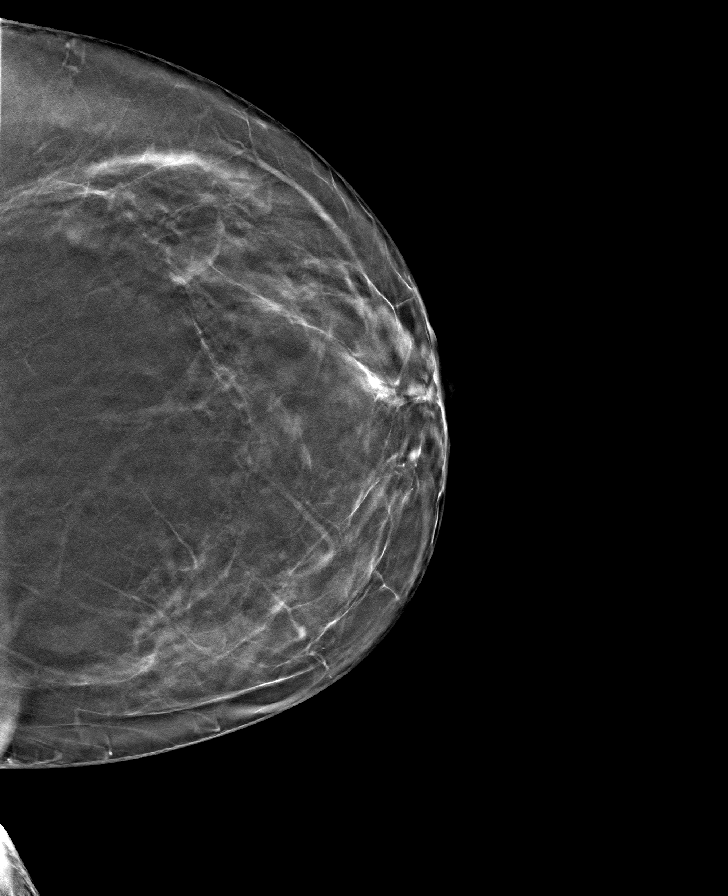

[R CC tomo · tomo slice 37/74.0]
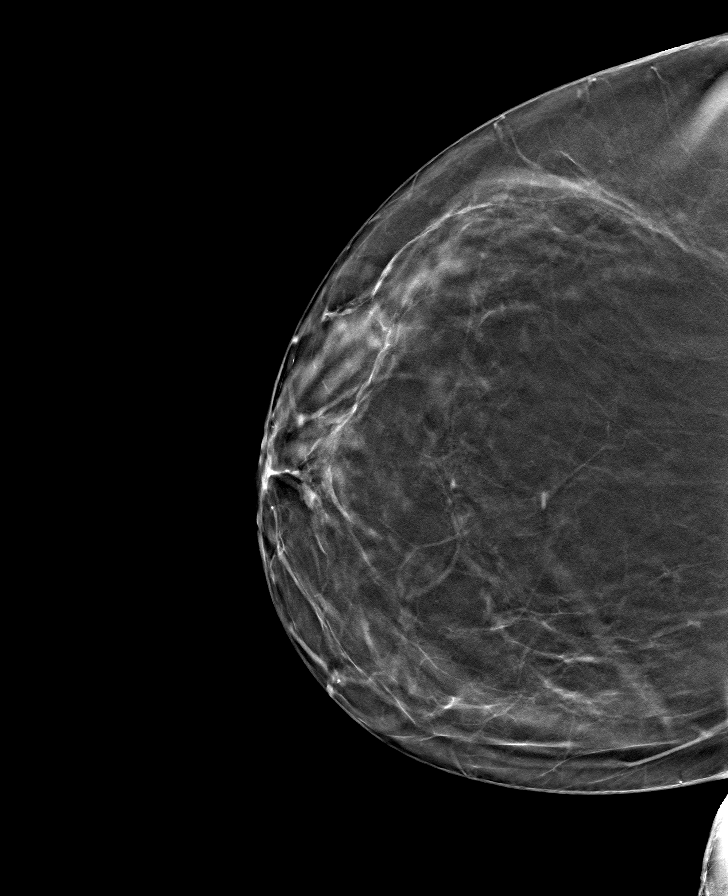

[R MLO tomo · tomo slice 44/87.0]
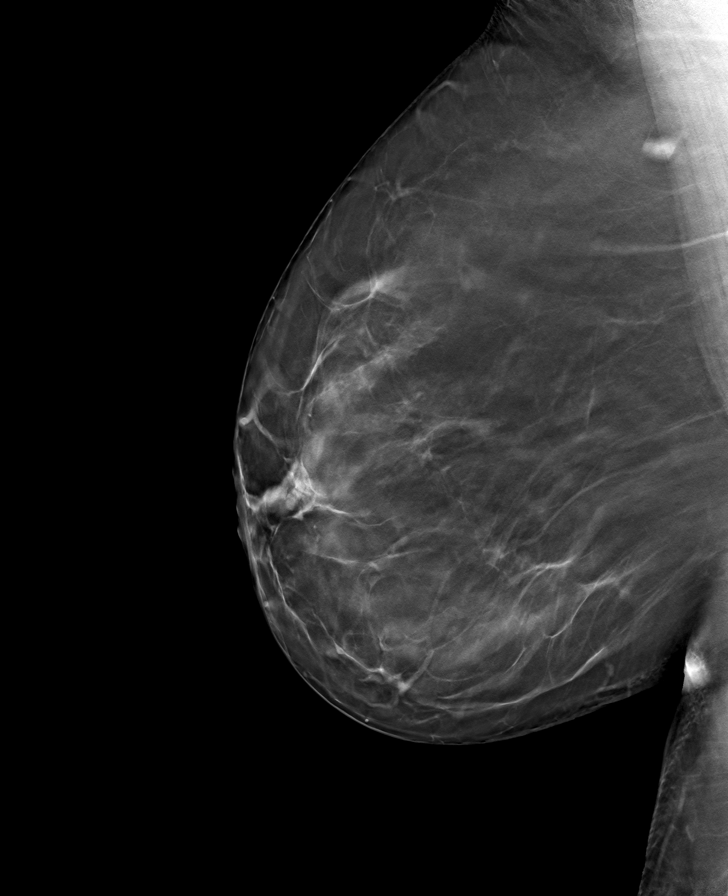

[L MLO tomo · tomo slice 45/88.0]
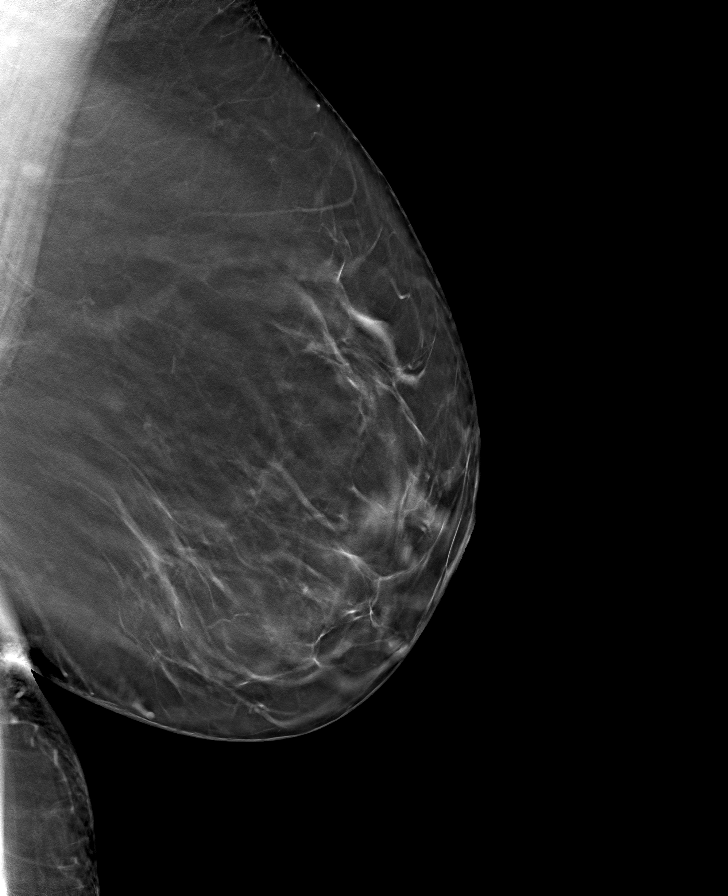

[8 of 24 positions shown; findings below may reference images not displayed]

ACR Breast Density Category b: There are scattered areas of
fibroglandular density.
FINDINGS: There are no findings suspicious for malignancy.
IMPRESSION: No mammographic evidence of malignancy. A result letter of this
screening mammogram will be mailed directly to the patient.

RECOMMENDATION:
Screening mammogram in one year. (Code:51-O-LD2)

BI-RADS CATEGORY  1: Negative.

## 2024-03-27 ENCOUNTER — Other Ambulatory Visit: Payer: Self-pay | Admitting: Family Medicine

## 2024-03-27 DIAGNOSIS — E782 Mixed hyperlipidemia: Secondary | ICD-10-CM

## 2024-03-27 DIAGNOSIS — Z87898 Personal history of other specified conditions: Secondary | ICD-10-CM

## 2024-03-27 DIAGNOSIS — I10 Essential (primary) hypertension: Secondary | ICD-10-CM

## 2024-03-27 MED ORDER — OMEPRAZOLE 20 MG PO CPDR
20.0000 mg | DELAYED_RELEASE_CAPSULE | Freq: Every day | ORAL | 0 refills | Status: DC
Start: 2024-03-27 — End: 2024-07-26

## 2024-03-27 MED ORDER — SIMVASTATIN 40 MG PO TABS
40.0000 mg | ORAL_TABLET | Freq: Every day | ORAL | 0 refills | Status: DC
Start: 2024-03-27 — End: 2024-06-28

## 2024-03-27 MED ORDER — HYDROCHLOROTHIAZIDE 25 MG PO TABS
25.0000 mg | ORAL_TABLET | Freq: Every day | ORAL | 0 refills | Status: DC
Start: 2024-03-27 — End: 2024-07-26

## 2024-06-28 ENCOUNTER — Encounter (HOSPITAL_BASED_OUTPATIENT_CLINIC_OR_DEPARTMENT_OTHER): Payer: Self-pay

## 2024-06-28 ENCOUNTER — Other Ambulatory Visit: Payer: Self-pay | Admitting: Family Medicine

## 2024-06-28 ENCOUNTER — Emergency Department (HOSPITAL_BASED_OUTPATIENT_CLINIC_OR_DEPARTMENT_OTHER)
Admission: EM | Admit: 2024-06-28 | Discharge: 2024-06-28 | Disposition: A | Discharge status: Home / Self Care | Attending: Emergency Medicine | Admitting: Emergency Medicine

## 2024-06-28 DIAGNOSIS — R040 Epistaxis: Secondary | ICD-10-CM | POA: Insufficient documentation

## 2024-06-28 DIAGNOSIS — I10 Essential (primary) hypertension: Secondary | ICD-10-CM | POA: Diagnosis not present

## 2024-06-28 DIAGNOSIS — E782 Mixed hyperlipidemia: Secondary | ICD-10-CM

## 2024-06-28 MED ORDER — OXYMETAZOLINE HCL 0.05 % NA SOLN
1.0000 | Freq: Once | NASAL | Status: AC
  Administered 2024-06-28: 1 via NASAL
  Filled 2024-06-28: qty 30

## 2024-06-28 NOTE — Discharge Instructions (Signed)
As we discussed, there are several techniques you can use to prevent or stop nosebleeds in the future.  Keep your nose moist either with saline spray several times a day or by applying a thin layer of Neosporin, bacitracin, or other antibiotic ointment to the inside of your nose once or twice a day.  If the bleeding starts up again, gently blow your nose into a tissue to clear the blood and clots, then apply 1-2 sprays to each affected nostril of over-the-counter Afrin nasal spray (oxymetazoline).   Then squeeze your nose shut tightly and DO NOT PEEK for at least 15 minutes.  This will resolve most nosebleeds.  If you continue to have trouble after trying these techniques, or anything seems out of the ordinary or concerns you, please return tot he Emergency Department.  

## 2024-06-28 NOTE — ED Provider Notes (Signed)
   Emergency Department Provider Note   I have reviewed the triage vital signs and the nursing notes.   HISTORY  Chief Complaint No chief complaint on file.   HPI Frances Conley is a 63 y.o. female presents to the emergency department with epistaxis.  She has had 2 episodes of right nostril epistaxis today.  No active bleeding.  She is not anticoagulated.  Denies any injury to the nose.  The longest episode lasted for around 5 minutes and then stopped spontaneously.  Past Medical History:  Diagnosis Date   Arthritis    both hips, hands, shoulders goes to Dr Heide, [facility]   Yzjijryz(215.9)    Hypercholesteremia    Hypertension    Ulcer     Review of Systems  Constitutional: No fever/chills Eyes: No visual changes. ENT: Right nostril epistaxis.  Genitourinary: Negative for dysuria. Neurological: Negative for headaches.  ____________________________________________   PHYSICAL EXAM:  VITAL SIGNS: ED Triage Vitals  Encounter Vitals Group     BP 06/28/24 2026 (!) 137/93     Pulse Rate 06/28/24 2026 82     Resp 06/28/24 2026 20     Temp 06/28/24 2026 98.5 F (36.9 C)     Temp Source 06/28/24 2026 Oral     SpO2 06/28/24 2026 99 %   Constitutional: Alert and oriented. Well appearing and in no acute distress. Eyes: Conjunctivae are normal.  Head: Atraumatic. Nose: No congestion/rhinnorhea.  No active bleeding in the right nostril.  There are 2 spots on the right medial area nasal septum which are erythematous.  No active bleeding Mouth/Throat: Mucous membranes are moist.   Neck: No stridor. Cardiovascular: Normal rate, regular rhythm. Good peripheral circulation. Grossly normal heart sounds.   Respiratory: Normal respiratory effort.  No retractions. Lungs CTAB. Gastrointestinal: No distention.  Musculoskeletal:  No gross deformities of extremities. Neurologic:  Normal speech and language.  Skin:  Skin is warm, dry and intact. No rash  noted.  ____________________________________________   PROCEDURES  Procedure(s) performed:   Procedures  None  ____________________________________________   INITIAL IMPRESSION / ASSESSMENT AND PLAN / ED COURSE  Pertinent labs & imaging results that were available during my care of the patient were reviewed by me and considered in my medical decision making (see chart for details).   This patient is Presenting for Evaluation of epistaxis, which does require a range of treatment options, and is a complaint that involves a low risk of morbidity and mortality.  The Differential Diagnoses include anterior epistaxis, posterior epistaxis, thrombocytopenia, etc.  Medical Decision Making: Summary:  Patient with uncomplicated, likely anterior epistaxis.  No active bleeding.  I did offer silver nitrate cauterization of the area which I do think was recently bleeding.  We discussed the procedure and patient declined.  Plan to discharge with Afrin and instructions for moisturizing nasal sprays and management strategies at home with strict ED return precautions discussed.   Patient's presentation is most consistent with acute, uncomplicated illness.   Disposition: discharge  ____________________________________________  FINAL CLINICAL IMPRESSION(S) / ED DIAGNOSES  Final diagnoses:  Epistaxis    Note:  This document was prepared using Dragon voice recognition software and may include unintentional dictation errors.  Fonda Law, MD, [facility] Emergency Medicine    Mihaela Fajardo, Fonda MATSU, MD 06/28/24 [phone]

## 2024-06-28 NOTE — ED Triage Notes (Signed)
 Pt complaints of nosebleed, started today, only on right side nostril. Comes and goes. Pt alert and oriented, denies pain.

## 2024-07-24 ENCOUNTER — Other Ambulatory Visit: Payer: Self-pay | Admitting: Family Medicine

## 2024-07-24 DIAGNOSIS — I10 Essential (primary) hypertension: Secondary | ICD-10-CM

## 2024-07-24 NOTE — Telephone Encounter (Signed)
 Called and LM for patient to return call, patient needs follow up last seen 05/08/2023 advised follow up Return in about 6 weeks (around 06/19/2023) for med check and labs.

## 2024-07-24 NOTE — Telephone Encounter (Signed)
 Patient needs appointment scheduled before medication can be refilled. Will call later

## 2024-07-26 ENCOUNTER — Telehealth (INDEPENDENT_AMBULATORY_CARE_PROVIDER_SITE_OTHER): Admitting: Family Medicine

## 2024-07-26 VITALS — Wt 207.0 lb

## 2024-07-26 DIAGNOSIS — R7309 Other abnormal glucose: Secondary | ICD-10-CM

## 2024-07-26 DIAGNOSIS — E559 Vitamin D deficiency, unspecified: Secondary | ICD-10-CM

## 2024-07-26 DIAGNOSIS — I10 Essential (primary) hypertension: Secondary | ICD-10-CM

## 2024-07-26 DIAGNOSIS — R0609 Other forms of dyspnea: Secondary | ICD-10-CM | POA: Diagnosis not present

## 2024-07-26 DIAGNOSIS — K219 Gastro-esophageal reflux disease without esophagitis: Secondary | ICD-10-CM

## 2024-07-26 DIAGNOSIS — Z87898 Personal history of other specified conditions: Secondary | ICD-10-CM | POA: Diagnosis not present

## 2024-07-26 DIAGNOSIS — E0590 Thyrotoxicosis, unspecified without thyrotoxic crisis or storm: Secondary | ICD-10-CM

## 2024-07-26 DIAGNOSIS — E782 Mixed hyperlipidemia: Secondary | ICD-10-CM

## 2024-07-26 DIAGNOSIS — R739 Hyperglycemia, unspecified: Secondary | ICD-10-CM

## 2024-07-26 MED ORDER — SIMVASTATIN 40 MG PO TABS: 40.0000 mg | ORAL_TABLET | Freq: Every day | ORAL | 1 refills | Status: AC

## 2024-07-26 MED ORDER — OMEPRAZOLE 20 MG PO CPDR: 20.0000 mg | DELAYED_RELEASE_CAPSULE | Freq: Every day | ORAL | 1 refills | Status: AC

## 2024-07-26 MED ORDER — HYDROCHLOROTHIAZIDE 25 MG PO TABS
25.0000 mg | ORAL_TABLET | Freq: Every day | ORAL | 1 refills | Status: AC
Start: 2024-07-26 — End: ?

## 2024-07-26 MED ORDER — POTASSIUM CHLORIDE ER 10 MEQ PO TBCR
20.0000 meq | EXTENDED_RELEASE_TABLET | Freq: Every day | ORAL | 1 refills | Status: DC
Start: 2024-07-26 — End: 2024-08-23

## 2024-07-26 NOTE — Progress Notes (Signed)
 Virtual Visit via Video Note  I connected with Frances Conley on 07/26/24 at 12:22 PM by a video enabled telemedicine application and verified that I am speaking with the correct person using two identifiers.  Some initial difficulty with connection.  Patient location: outside car - no others can hear her info.  My location: office - [place].    I discussed the limitations, risks, security and privacy concerns of performing an evaluation and management service by telephone and the availability of in person appointments. I also discussed with the patient that there may be a patient responsible charge related to this service. The patient expressed understanding and agreed to proceed, consent obtained  Chief complaint:  Chief Complaint  Patient presents with   Follow-up    Follow up visit for medication. Needs to schedule CPE. Wants to get an OBGYN.     History of Present Illness: Frances Conley is a 63 y.o. female Follow-up for chronic conditions.  Have not seen her since June 2024, 6-week follow-up recommended at that time. She denies any barriers to care.   Hypertension: Treated with HCTZ, intermittent dosing of meds when discussed last year, techniques discussed to assist with medication adherence.  She was taking meds daily on her follow-up June 3. We discussed palpitations last year, TSH was stable, CMP normal, CBC in April last year was normal.  Recommend cutting back on caffeine.  Nonexertional chest pain, possible stress/anxiety component but referred to cardiology to determine if other testing indicated.  We also discussed may have a therapist or counselor but that was deferred at that time, handout was given on stress management.  On chart review she was evaluated by cardiology most recently in August of last year, with recommended 66-month follow-up.  Of note she did have elevated LFTs of AST 46, ALT 46 on her labs 07/27/2023 and hyperglycemia with glucose 128 at that time.   Hemoglobin A1c at diabetic level of 6.6 on August 2024 labs.  Advised to see PCP.   Some increased thirst at times. Drinks sodas and eating muffins, sugary foods at times. No n/v. No new blurry vision over past year.   Still using pillbox. Taking meds most days. Misses about 2 times per week.  Plans to take earlier in the morning to see if that will help remember to take meds.   Covid infection last week, feeling better now. Headache - now resolved.  Home readings - none.  Constitutional: Negative for fatigue and unexpected weight change.  Eyes: Negative for visual disturbance.  Respiratory: Negative for cough, chest tightness and shortness of breath.  Cardiovascular: Negative for chest pain, palpitations and leg swelling.  Gastrointestinal: Negative for abdominal pain and blood in stool.  Neurological: Negative for dizziness, light-headedness and headaches (other than HA with covid - improved).    BP Readings from Last 3 Encounters:  06/28/24 (!) 137/93  07/27/23 124/82  05/08/23 120/70   Lab Results  Component Value Date   CREATININE 0.90 07/27/2023   Vitamin D  deficiency Mildly low in the past but decreased adherence to supplementation.  Recommended 6-week follow-up last June to check updated labs with more consistent dosing of medicines at that time. Taking 2000iu daily.  Last vitamin D  Lab Results  Component Value Date   VD25OH 24.25 (L) 04/17/2023   Hyperthyroidism History of mild hyperthyroidism, small multinodular goiter and Graves' disease previously treated by endocrinology.  Plan for continued monitoring of TSH 1-2 times per year and follow-up with endocrine if  TSH below 0.2 or new symptoms. Lab Results  Component Value Date   TSH 0.577 07/27/2023     Hyperlipidemia: Simvastatin  40 mg daily few missed doses per week as above.  Elevated liver tests last year - no recent bloodwork.  Lab Results  Component Value Date   CHOL 151 04/17/2023   HDL 33.60 (L)  04/17/2023   LDLCALC 96 04/17/2023   TRIG 107.0 04/17/2023   CHOLHDL 4 04/17/2023   Lab Results  Component Value Date   ALT 46 (H) 07/27/2023   AST 46 (H) 07/27/2023   ALKPHOS 115 07/27/2023   BILITOT 0.5 07/27/2023   GERD with history of PUD Remote history of peptic ulcer, heartburn, treated with omeprazole  20 mg daily. Not taking omeprazole  - stopped taking about 2 months ago - ran out.  Some return of heartburn off meds. No melena/hematochezia/abdominal pain.   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   Goiter, nodular 10/04/2016   Mass of left side of neck 10/04/2016   Benign essential hypertension 09/23/2016   Generalized osteoarthritis of multiple sites 09/23/2016   GERD without esophagitis 09/23/2016   Mixed hyperlipidemia 09/23/2016   Vitamin D  deficiency disease 09/23/2016   Low serum thyroid  stimulating hormone (TSH) 07/25/2016   Acute midline low back pain without sciatica 02/24/2016   Past Medical History:  Diagnosis Date   Arthritis    both hips, hands, shoulders goes to Dr Heide, [facility] md   Yzjijryz(215.9)    Hypercholesteremia    Hypertension    Ulcer    Past Surgical History:  Procedure Laterality Date   CESAREAN SECTION     1981, 1986, 1984   TUBAL LIGATION     UTERINE FIBROID SURGERY     No Known Allergies Prior to Admission medications   Medication Sig Start Date End Date Taking? Authorizing Provider  Cholecalciferol (VITAMIN D3) 50 MCG (2000 UT) CAPS Take by mouth.   Yes [provider]  Elastic Bandages & Supports (TRUFORM STOCKINGS 10-20MMHG) MISC 1 application by Does not apply route daily. Low intensity compression stockings, knee-high.  Okay to substitute with different brand as needed for insurance  coverage.  Dispense 2 pairs. 01/27/22  Yes Levora Reyes SAUNDERS, MD  hydrochlorothiazide  (HYDRODIURIL ) 25 MG tablet Take 1 tablet by mouth once daily 07/26/24  Yes Levora Reyes SAUNDERS, MD  omeprazole  (PRILOSEC) 20 MG capsule Take 1 capsule (20 mg total) by mouth daily. 03/27/24  Yes Tabori, Katherine E, MD  potassium chloride  (KLOR-CON ) 10 MEQ tablet Take 2 tablets (20 mEq total) by mouth daily. Patient taking differently: Take 20 mEq by mouth 2 (two) times daily. 07/27/23  Yes Jerilynn Lamarr HERO, NP  simvastatin  (ZOCOR ) 40 MG tablet Take 1 tablet by mouth once daily 06/28/24  Yes Levora Reyes SAUNDERS, MD  benzonatate  (TESSALON ) 100 MG capsule Take 1 capsule (100 mg total) by mouth every 8 (eight) hours. Patient not taking: Reported on 07/26/2024 03/29/23   Emil Share, DO  metaxalone (SKELAXIN) 800 MG tablet Take by mouth. Patient not taking: Reported on 07/26/2024 01/29/18   [provider]   Social History   Socioeconomic History   Marital status: Widowed    Spouse name: Not on file   Number of children: Not on file   Years of  education: Not on file   Highest education level: 12th grade  Occupational History   Not on file  Tobacco Use   Smoking status: Former    Current packs/day: 0.50    Types: Cigarettes   Smokeless tobacco: Never  Vaping Use   Vaping status: Never Used  Substance and Sexual Activity   Alcohol use: No   Drug use: Never   Sexual activity: Not on file  Other Topics Concern   Not on file  Social History Narrative   Three children, 10 grands   Lives with daughter and 4 grands.     Social Drivers of Corporate investment banker Strain: Low Risk  (07/26/2024)   Overall Financial Resource Strain (CARDIA)    Difficulty of Paying Living Expenses: Not hard at all  Food Insecurity: No Food Insecurity (07/26/2024)   Hunger Vital Sign    Worried About Running Out of Food in the Last Year: Never true    Ran Out of Food in the Last Year: Never true  Transportation Needs:  Unknown (07/26/2024)   PRAPARE - Administrator, Civil Service (Medical): Patient declined    Lack of Transportation (Non-Medical): No  Physical Activity: Insufficiently Active (07/26/2024)   Exercise Vital Sign    Days of Exercise per Week: 3 days    Minutes of Exercise per Session: 20 min  Stress: Not on file  Social Connections: Socially Isolated (07/26/2024)   Social Connection and Isolation Panel    Frequency of Communication with Friends and Family: Once a week    Frequency of Social Gatherings with Friends and Family: Once a week    Attends Religious Services: Patient declined    Database administrator or Organizations: No    Attends Banker Meetings: Not on file    Marital Status: Widowed  Intimate Partner Violence: Not on file    Observations/Objective: Vitals:   07/26/24 1211  Weight: 207 lb (93.9 kg)   Nontoxic appearance on video.  Speaking in full sentences, no respiratory distress.  Coherent responses, euthymic mood.  All questions answered with understanding of plan expressed.   Assessment and Plan: Hyperglycemia Elevated hemoglobin A1c - Plan: Comprehensive metabolic panel with GFR, Microalbumin / creatinine urine ratio, Hemoglobin A1c  - Hyperglycemia with A1c at diabetic level 1 year ago through cardiology, unfortunately has not had follow-up testing or evaluation.  Does note some increased thirst at times, but she attributes this to her diet.  Denies nausea, vomiting, or acute changes in vision.  Check updated A1c, CMP with an office evaluation in the next 2 weeks to decide on plan.  DOE (dyspnea on exertion) - Plan: potassium chloride  (KLOR-CON ) 10 MEQ tablet  - Prior eval with cardiology, denies any cardiac symptoms at this time.  In office eval next few weeks.  Mixed hyperlipidemia - Plan: Lipid panel, Microalbumin / creatinine urine ratio, simvastatin  (ZOCOR ) 40 MG tablet  - Inconsistent dosing, continue pill minder, earlier dosing of  meds may be helpful for adherence.  Check labs and adjust plan accordingly.  Essential hypertension - Plan: CBC, hydrochlorothiazide  (HYDRODIURIL ) 25 MG tablet  - Denies any side effects with meds, unknown control, in office evaluation with blood pressure evaluation next few weeks, labs above.  History of ulcer disease - Plan: omeprazole  (PRILOSEC) 20 MG capsule GERD without esophagitis - Plan: CBC  - Inconsistent dosing of medications, off PPI recently.  Restart omeprazole .  Denies any symptoms of ulcers at this time.  RTC/ER  precautions.  Vitamin D  deficiency  - Check updated labs and adjust meds accordingly.  Hyperthyroidism - Plan: TSH  - Check updated TSH, plan as above depending on level and symptoms may need follow-up with endocrinology, no current meds.  Follow Up Instructions: In person exam within 2 weeks for physical exam, assessment of blood pressure, vital signs, and review of labs.   I discussed the assessment and treatment plan with the patient. The patient was provided an opportunity to ask questions and all were answered. The patient agreed with the plan and demonstrated an understanding of the instructions.   The patient was advised to call back or seek an in-person evaluation if the symptoms worsen or if the condition fails to improve as anticipated.   Reyes JONELLE Pines, MD

## 2024-07-26 NOTE — Patient Instructions (Signed)
 Thank you for joining the video call today. Continue to use the pill minder/pill dispenser, and if it is easier to remember medications earlier in the morning I think that is a great idea.    I did not change any doses of your medicines today but we can certainly discuss that at your follow-up in person in the next few weeks when we review your labs.  Make sure to take your blood pressure and cholesterol medications daily.  Restart the omeprazole  for heartburn daily.  Please have lab work done at the [facility] location below either today or early next week so we can check your blood sugar, diabetes test, recheck liver tests and some of your other electrolytes.  We will review those labs at your in person visit in the next few weeks, and that way I can also do an exam and check your blood pressure in office.  If you have any questions, concerns or new symptoms prior to follow-up with me in the next 2 weeks, please let me know.  Take care!   [facility] or xray: Walk in 8:30-4:30 during weekdays, no appointment needed [address]  [place], [place] [address]

## 2024-07-26 NOTE — Progress Notes (Signed)
 Called and LM to call back to make appt.

## 2024-08-02 ENCOUNTER — Other Ambulatory Visit (INDEPENDENT_AMBULATORY_CARE_PROVIDER_SITE_OTHER)

## 2024-08-02 DIAGNOSIS — K219 Gastro-esophageal reflux disease without esophagitis: Secondary | ICD-10-CM | POA: Diagnosis not present

## 2024-08-02 DIAGNOSIS — I10 Essential (primary) hypertension: Secondary | ICD-10-CM | POA: Diagnosis not present

## 2024-08-02 DIAGNOSIS — E0590 Thyrotoxicosis, unspecified without thyrotoxic crisis or storm: Secondary | ICD-10-CM

## 2024-08-02 DIAGNOSIS — E782 Mixed hyperlipidemia: Secondary | ICD-10-CM

## 2024-08-02 DIAGNOSIS — R7309 Other abnormal glucose: Secondary | ICD-10-CM | POA: Diagnosis not present

## 2024-08-02 DIAGNOSIS — E559 Vitamin D deficiency, unspecified: Secondary | ICD-10-CM | POA: Diagnosis not present

## 2024-08-02 LAB — COMPREHENSIVE METABOLIC PANEL WITH GFR
ALT: 25 U/L (ref 0–35)
AST: 27 U/L (ref 0–37)
Albumin: 4 g/dL (ref 3.5–5.2)
Alkaline Phosphatase: 78 U/L (ref 39–117)
BUN: 8 mg/dL (ref 6–23)
CO2: 29 meq/L (ref 19–32)
Calcium: 8.8 mg/dL (ref 8.4–10.5)
Chloride: 102 meq/L (ref 96–112)
Creatinine, Ser: 0.89 mg/dL (ref 0.40–1.20)
GFR: 69.54 mL/min (ref >60.00)
Glucose, Bld: 113 mg/dL — ABNORMAL HIGH (ref 70–99)
Potassium: 3.2 meq/L — ABNORMAL LOW (ref 3.5–5.1)
Sodium: 142 meq/L (ref 135–145)
Total Bilirubin: 0.6 mg/dL (ref 0.2–1.2)
Total Protein: 7.6 g/dL (ref 6.0–8.3)

## 2024-08-02 LAB — CBC
HCT: 41.3 % (ref 36.0–46.0)
Hemoglobin: 13.5 g/dL (ref 12.0–15.0)
MCHC: 32.8 g/dL (ref 30.0–36.0)
MCV: 81.4 fl (ref 78.0–100.0)
Platelets: 183 K/uL (ref 150.0–400.0)
RBC: 5.07 Mil/uL (ref 3.87–5.11)
RDW: 13.8 % (ref 11.5–15.5)
WBC: 7.1 K/uL (ref 4.0–10.5)

## 2024-08-02 LAB — LIPID PANEL
Cholesterol: 141 mg/dL (ref 0–200)
HDL: 36.3 mg/dL — ABNORMAL LOW (ref >39.00)
LDL Cholesterol: 76 mg/dL (ref 0–99)
NonHDL: 104.95
Total CHOL/HDL Ratio: 4
Triglycerides: 146 mg/dL (ref 0.0–149.0)
VLDL: 29.2 mg/dL (ref 0.0–40.0)

## 2024-08-02 LAB — HEMOGLOBIN A1C: Hgb A1c MFr Bld: 6.8 % — ABNORMAL HIGH (ref 4.6–6.5)

## 2024-08-02 LAB — MICROALBUMIN / CREATININE URINE RATIO
Creatinine,U: 97.5 mg/dL
Microalb Creat Ratio: UNDETERMINED mg/g (ref 0.0–30.0)
Microalb, Ur: <0.7 mg/dL

## 2024-08-02 LAB — TSH: TSH: 0.53 u[IU]/mL (ref 0.35–5.50)

## 2024-08-02 LAB — VITAMIN D 25 HYDROXY (VIT D DEFICIENCY, FRACTURES): VITD: 34.78 ng/mL (ref 30.00–100.00)

## 2024-08-05 ENCOUNTER — Ambulatory Visit: Payer: Self-pay | Admitting: Family Medicine

## 2024-08-05 DIAGNOSIS — E876 Hypokalemia: Secondary | ICD-10-CM

## 2024-08-06 NOTE — Telephone Encounter (Signed)
 Yes - needs to keep 9/5 visit for in person assessment as last visit was virtual. We can check her potassium again at that visit.

## 2024-08-06 NOTE — Telephone Encounter (Signed)
 Called patient to schedule lab visit, patient stated she would like to have this completed next Friday at [place]. I have placed the future lab orders for this. Patient is also wondering if she still needs to come in on 08/09/2024 and schedule a 3 month follow up as well?   Did inform patient I would give her a call back and let her know about these appointments. Patient also states if she needs a 3 month appointment Fridays are best. Patient state she is having trouble with her phone and we can leave her a detailed VM and she will return our call.

## 2024-08-08 NOTE — Progress Notes (Signed)
 Made a note in patients visit tomorrow to schedule 3 month follow up.

## 2024-08-09 ENCOUNTER — Ambulatory Visit: Admitting: Family Medicine

## 2024-08-16 ENCOUNTER — Other Ambulatory Visit (INDEPENDENT_AMBULATORY_CARE_PROVIDER_SITE_OTHER)

## 2024-08-16 DIAGNOSIS — E876 Hypokalemia: Secondary | ICD-10-CM | POA: Diagnosis not present

## 2024-08-16 LAB — BASIC METABOLIC PANEL WITH GFR
BUN: 12 mg/dL (ref 6–23)
CO2: 27 meq/L (ref 19–32)
Calcium: 9.2 mg/dL (ref 8.4–10.5)
Chloride: 103 meq/L (ref 96–112)
Creatinine, Ser: 0.95 mg/dL (ref 0.40–1.20)
GFR: 64.29 mL/min (ref >60.00)
Glucose, Bld: 106 mg/dL — ABNORMAL HIGH (ref 70–99)
Potassium: 3.8 meq/L (ref 3.5–5.1)
Sodium: 140 meq/L (ref 135–145)

## 2024-08-19 ENCOUNTER — Encounter: Payer: Self-pay | Admitting: Podiatry

## 2024-08-22 ENCOUNTER — Ambulatory Visit: Payer: Self-pay | Admitting: Family Medicine

## 2024-08-23 ENCOUNTER — Encounter: Payer: Self-pay | Admitting: Family Medicine

## 2024-08-23 ENCOUNTER — Ambulatory Visit (INDEPENDENT_AMBULATORY_CARE_PROVIDER_SITE_OTHER): Admitting: Family Medicine

## 2024-08-23 VITALS — BP 110/78 | HR 94 | Temp 98.7°F | Ht 63.0 in | Wt 209.0 lb

## 2024-08-23 DIAGNOSIS — I10 Essential (primary) hypertension: Secondary | ICD-10-CM | POA: Diagnosis not present

## 2024-08-23 DIAGNOSIS — Z8639 Personal history of other endocrine, nutritional and metabolic disease: Secondary | ICD-10-CM

## 2024-08-23 DIAGNOSIS — Z124 Encounter for screening for malignant neoplasm of cervix: Secondary | ICD-10-CM

## 2024-08-23 DIAGNOSIS — E782 Mixed hyperlipidemia: Secondary | ICD-10-CM | POA: Diagnosis not present

## 2024-08-23 DIAGNOSIS — Z114 Encounter for screening for human immunodeficiency virus [HIV]: Secondary | ICD-10-CM

## 2024-08-23 DIAGNOSIS — E118 Type 2 diabetes mellitus with unspecified complications: Secondary | ICD-10-CM

## 2024-08-23 DIAGNOSIS — H538 Other visual disturbances: Secondary | ICD-10-CM

## 2024-08-23 DIAGNOSIS — R0609 Other forms of dyspnea: Secondary | ICD-10-CM

## 2024-08-23 DIAGNOSIS — E559 Vitamin D deficiency, unspecified: Secondary | ICD-10-CM | POA: Diagnosis not present

## 2024-08-23 DIAGNOSIS — Z87898 Personal history of other specified conditions: Secondary | ICD-10-CM

## 2024-08-23 DIAGNOSIS — H04203 Unspecified epiphora, bilateral lacrimal glands: Secondary | ICD-10-CM

## 2024-08-23 MED ORDER — POTASSIUM CHLORIDE ER 10 MEQ PO TBCR: 30.0000 meq | EXTENDED_RELEASE_TABLET | Freq: Every day | ORAL | 1 refills | Status: AC

## 2024-08-23 MED ORDER — BLOOD GLUCOSE TEST VI STRP: ORAL_STRIP | 0 refills | Status: AC

## 2024-08-23 MED ORDER — LANCET DEVICE MISC: 0 refills | Status: AC

## 2024-08-23 MED ORDER — LANCETS MISC. MISC
0 refills | Status: AC
Start: 2024-08-23 — End: ?

## 2024-08-23 MED ORDER — BLOOD GLUCOSE MONITORING SUPPL DEVI
0 refills | Status: AC
Start: 2024-08-23 — End: ?

## 2024-08-23 NOTE — Patient Instructions (Addendum)
 Try to walk 5-10 minutes per day as a start for exercise. See info below about diabetes and diet, home blood sugar testing.  No new meds for now. Recheck in 3 months with repeat labs. I will refer you to eye doctor for your eye symptoms.  I will refer you to a gynecologist for cervical cancer screening.  I do recommend taking your omeprazole  daily for heartburn and with history of ulcer.  I will see you in 3 months, but if there are any concerns that we did not address today or any new symptoms please see me sooner.  Take care  Type 2 Diabetes Mellitus, Self-Care, Adult Caring for yourself after you have been diagnosed with type 2 diabetes (type 2 diabetes mellitus) means keeping your blood sugar (glucose) under control with a balance of: Nutrition. Exercise. Lifestyle changes. Medicines or insulin, if needed. Support from your team of health care providers and others. What are the risks? Having type 2 diabetes can put you at risk for other long-term (chronic) conditions, such as heart disease and kidney disease. Your health care provider may prescribe medicines to help prevent complications from diabetes. How to monitor your blood glucose  Check your blood glucose every day or as often as told by your health care provider. Have your A1C (hemoglobin A1C) level checked two or more times a year, or as often as told by your health care provider. Your health care provider will set personalized treatment goals for you. Generally, the goal of treatment is to maintain the following blood glucose levels: Before meals: 80-130 mg/dL (4.4-7.2 mmol/L). After meals: below 180 mg/dL (10 mmol/L). A1C level: less than 7%. How to manage hyperglycemia and hypoglycemia Hyperglycemia symptoms Hyperglycemia, also called high blood glucose, occurs when blood glucose is too high. Make sure you know the early signs of hyperglycemia, such as: Increased thirst. Hunger. Feeling very tired. Needing to urinate more  often than usual. Blurry vision. Hypoglycemia symptoms Hypoglycemia, also called low blood glucose, occurs with a blood glucose level at or below 70 mg/dL (3.9 mmol/L). Diabetes medicines lower your blood glucose and can cause hypoglycemia. The risk for hypoglycemia increases during or after exercise, during sleep, during illness, and when skipping meals or not eating for a long time (fasting). It is important to know the symptoms of hypoglycemia and treat it right away. Always have a 15-gram rapid-acting carbohydrate snack with you to treat low blood glucose. Family members and close friends should also know the symptoms and understand how to treat hypoglycemia, in case you are not able to treat yourself. Symptoms may include: Hunger. Anxiety. Sweating and feeling clammy. Dizziness or feeling light-headed. Sleepiness. Increased heart rate. Irritability. Tingling or numbness around the mouth, lips, or tongue. Restless sleep. Severe hypoglycemia is when your blood glucose level is at or below 54 mg/dL (3 mmol/L). Severe hypoglycemia is an emergency. Do not wait to see if the symptoms will go away. Get medical help right away. Call your local emergency services (911 in the [place] Do not drive yourself to the hospital. If you have severe hypoglycemia and you cannot eat or drink, you may need glucagon. A family member or close friend should learn how to check your blood glucose and how to give you glucagon. Ask your health care provider if you need to have an emergency glucagon kit available. Follow these instructions at home: Medicines Take prescribed insulin or diabetes medicines as told by your health care provider. Do not run out of insulin or  other diabetes medicines. Plan ahead so you always have these available. If you use insulin, adjust your dosage based on your physical activity and what foods you eat. Your health care provider will tell you how to adjust your dosage. Take  over-the-counter and prescription medicines only as told by your health care provider. Eating and drinking  What you eat and drink affects your blood glucose and your insulin dosage. Making good choices helps to control your diabetes and prevent other health problems. A healthy meal plan includes eating lean proteins, complex carbohydrates, fresh fruits and vegetables, low-fat dairy products, and healthy fats. Make an appointment to see a registered dietitian to help you create an eating plan that is right for you. Make sure that you: Follow instructions from your health care provider about eating or drinking restrictions. Drink enough fluid to keep your urine pale yellow. Keep a record of the carbohydrates that you eat. Do this by reading food labels and learning the standard serving sizes of foods. Follow your sick-day plan whenever you cannot eat or drink as usual. Make this plan in advance with your health care provider.  Activity Stay active. Exercise regularly, as told by your health care provider. This may include: Stretching and doing strength exercises, such as yoga or weight lifting, two or more times a week. Doing 150 minutes or more of moderate-intensity or vigorous-intensity exercise each week. This could be brisk walking, biking, or water aerobics. Spread out your activity over 3 or more days of the week. Do not go more than 2 days in a row without doing some kind of physical activity. When you start a new exercise or activity, work with your health care provider to adjust your insulin, medicines, or food intake as needed. Lifestyle Do not use any products that contain nicotine or tobacco. These products include cigarettes, chewing tobacco, and vaping devices, such as e-cigarettes. If you need help quitting, ask your health care provider. If you drink alcohol and your health care provider says that it is safe for you: Limit how much you have to: 0-1 drink a day for women who are  not pregnant. 0-2 drinks a day for men. Know how much alcohol is in your drink. In the [place] one drink equals one 12 oz bottle of beer (355 mL), one 5 oz glass of wine (148 mL), or one 1 oz glass of hard liquor (44 mL). Learn to manage stress. If you need help with this, ask your health care provider. Take care of your body  Keep your immunizations up to date. In addition to getting vaccinations as told by your health care provider, it is recommended that you get vaccinated against the following illnesses: The flu (influenza). Get a flu shot every year. Pneumonia. Hepatitis B. Schedule an eye exam soon after your diagnosis, and then one time every year after that. Check your skin and feet every day for cuts, bruises, redness, blisters, or sores. Schedule a foot exam with your health care provider once every year. Brush your teeth and gums two times a day, and floss one or more times a day. Visit your dentist one or more times every 6 months. Maintain a healthy weight. General instructions Share your diabetes management plan with people in your workplace, school, and household. Carry a medical alert card or wear medical alert jewelry. Keep all follow-up visits. This is important. Questions to ask your health care provider Should I meet with a certified diabetes care and education specialist? Where can  I find a support group for people with diabetes? Where to find more information For help and guidance and for more information about diabetes, please visit: American Diabetes Association (ADA): www.diabetes.org American Association of Diabetes Care and Education Specialists (ADCES): www.diabeteseducator.org International Diabetes Federation (IDF): DCOnly.dk Summary Caring for yourself after you have been diagnosed with type 2 diabetes (type 2 diabetes mellitus) means keeping your blood sugar (glucose) under control with a balance of nutrition, exercise, lifestyle changes, and  medicine. Check your blood glucose every day, as often as told by your health care provider. Having diabetes can put you at risk for other long-term (chronic) conditions, such as heart disease and kidney disease. Your health care provider may prescribe medicines to help prevent complications from diabetes. Share your diabetes management plan with people in your workplace, school, and household. Keep all follow-up visits. This is important. This information is not intended to replace advice given to you by your health care provider. Make sure you discuss any questions you have with your health care provider. Document Revised: 04/21/2021 Document Reviewed: 04/21/2021 [employer] Patient Education  2024 [employer].

## 2024-08-23 NOTE — Progress Notes (Signed)
 Subjective:  Patient ID: Frances Conley, female    DOB: 1961-08-18  Age: 63 y.o. MRN: [id number]  CC:  Chief Complaint  Patient presents with   Follow-up    2wk; review labs; want to discuss better alternatives for food to help with weight and keeping sugar levels down; also wants to discuss eye doctors    HPI [name] presents for in office follow-up. Last in person appointment in June 2024.  Telemedicine visit August 22.  Diabetes: A1c elevated at diabetic level last June, but lost to follow-up until recent virtual visit in August.  Repeat A1c still at diabetic level, 6.8.  No current meds. Weight is actually down 2 pounds from last August. Needs info on diet for diabetes. No regular exercise. Busy during the day.  Cut back on soda, drinking sweet tea. Plans to drink more water.  Trying to cut back on desserts.  She does note some blurry vision at times, feels like when trying to read things - blurry at times, eyes water. Hx of Graves disease, and question of possible thyroid  eye disease. No recent optho eval. Requests new optho - prior Dr. Roz.  Wt Readings from Last 3 Encounters:  08/23/24 209 lb (94.8 kg)  07/26/24 207 lb (93.9 kg)  07/27/23 211 lb 6.4 oz (95.9 kg)    Lab Results  Component Value Date   HGBA1C 6.8 (H) 08/02/2024   HGBA1C 6.6 (H) 07/27/2023   Lab Results  Component Value Date   MICROALBUR <0.7 08/02/2024   LDLCALC 76 08/02/2024   CREATININE 0.95 08/16/2024    Hypertension: Treated with hydrochlorothiazide  25 mg daily.  Denies new side effects.  Elevated reading on July but stable today. Taking 30meq potassium daily now. Repeat labs better recently.  Home readings: BP Readings from Last 3 Encounters:  08/23/24 110/78  06/28/24 (!) 137/93  07/27/23 124/82   Lab Results  Component Value Date   CREATININE 0.95 08/16/2024   GERD with history of peptic ulcer disease. Inconsistent dosing of PPI when discussed in August.  Recommended  restarting omeprazole  at that time although asymptomatic. Not taking meds - unknown reason. Some breakthrough heartburn.   Vitamin D  deficiency Was taking 2000 units supplements daily when discussed in August. Last vitamin D  Lab Results  Component Value Date   VD25OH 34.78 08/02/2024   Hyperlipidemia: Treated with simvastatin  40 mg daily, episodic missed doses discussed in August but LDL overall looked okay at 6 on her most recent labs. Back on daily dosing.  Lab Results  Component Value Date   CHOL 141 08/02/2024   HDL 36.30 (L) 08/02/2024   LDLCALC 76 08/02/2024   TRIG 146.0 08/02/2024   CHOLHDL 4 08/02/2024   Lab Results  Component Value Date   ALT 25 08/02/2024   AST 27 08/02/2024   ALKPHOS 78 08/02/2024   BILITOT 0.6 08/02/2024   Hyperthyroidism See last visit.  History of mild hyperthyroidism, small multinodular goiter and Graves' disease previously treated by endocrinology with continued monitoring of TSH 1-2 times per year, endocrinology follow-up planned if TSH is below 0.2 or new symptoms. Lab Results  Component Value Date   TSH 0.53 08/02/2024    HM: Declines all vaccines.  Declines colon cancer screening at this time.  Requests referral to gyn for cervical cancer screening.   Requests HIV screening test. Not sexually active. No contacts or symptoms, just requests testing.   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   Goiter, nodular 10/04/2016   Mass of left side of neck 10/04/2016   Benign essential hypertension 09/23/2016   Generalized osteoarthritis of multiple sites 09/23/2016   GERD without esophagitis 09/23/2016   Mixed hyperlipidemia 09/23/2016   Vitamin D  deficiency disease 09/23/2016   Low serum thyroid   stimulating hormone (TSH) 07/25/2016   Acute midline low back pain without sciatica 02/24/2016   Past Medical History:  Diagnosis Date   Arthritis    both hips, hands, shoulders goes to Dr Heide, [facility]   Yzjijryz(215.9)    Hypercholesteremia    Hypertension    Ulcer    Past Surgical History:  Procedure Laterality Date   CESAREAN SECTION     1981, 1986, 1984   TUBAL LIGATION     UTERINE FIBROID SURGERY     No Known Allergies Prior to Admission medications   Medication Sig Start Date End Date Taking? Authorizing Provider  Cholecalciferol (VITAMIN D3) 50 MCG (2000 UT) CAPS Take by mouth.   Yes [provider]  Elastic Bandages & Supports (TRUFORM STOCKINGS 10-20MMHG) MISC 1 application by Does not apply route daily. Low intensity compression stockings, knee-high.  Okay to substitute with different brand as needed for insurance coverage.  Dispense 2 pairs. 01/27/22  Yes Levora Reyes SAUNDERS, MD  hydrochlorothiazide  (HYDRODIURIL ) 25 MG tablet Take 1 tablet (25 mg total) by mouth daily. 07/26/24  Yes Levora Reyes SAUNDERS, MD  omeprazole  (PRILOSEC) 20 MG capsule Take 1 capsule (20 mg total) by mouth daily. 07/26/24  Yes Levora Reyes SAUNDERS, MD  potassium chloride  (KLOR-CON ) 10 MEQ tablet Take 2 tablets (20 mEq total) by mouth daily. 07/26/24  Yes Levora Reyes SAUNDERS, MD  simvastatin  (ZOCOR ) 40 MG tablet Take 1 tablet (40 mg total) by mouth daily. 07/26/24  Yes Levora Reyes SAUNDERS, MD  benzonatate  (TESSALON ) 100 MG capsule Take 1 capsule (100 mg total) by mouth every 8 (eight) hours. Patient not taking: Reported on 08/23/2024 03/29/23   Emil Share, DO   Social History   Socioeconomic History   Marital status: Widowed    Spouse name: Not on file   Number of children: Not on file   Years of education: Not on file   Highest education level: 12th grade  Occupational History   Not on file  Tobacco Use   Smoking status: Former    Current packs/day: 0.50    Types: Cigarettes    Smokeless tobacco: Never  Vaping Use   Vaping status: Never Used  Substance and Sexual Activity   Alcohol use: No   Drug use: Never   Sexual activity: Not on file  Other Topics Concern   Not on file  Social History Narrative   Three children, 10 grands   Lives with daughter and 4 grands.     Social Drivers of Corporate investment banker Strain: Low Risk  (07/26/2024)   Overall Financial Resource Strain (CARDIA)    Difficulty of Paying Living Expenses: Not hard at all  Food Insecurity: No Food Insecurity (07/26/2024)   Hunger Vital Sign    Worried About Running Out of Food in the Last Year: Never true    Ran Out of Food in the Last Year: Never true  Transportation Needs: Unknown (07/26/2024)   PRAPARE - Transportation    Lack  of Transportation (Medical): Patient declined    Lack of Transportation (Non-Medical): No  Physical Activity: Insufficiently Active (07/26/2024)   Exercise Vital Sign    Days of Exercise per Week: 3 days    Minutes of Exercise per Session: 20 min  Stress: Not on file  Social Connections: Socially Isolated (07/26/2024)   Social Connection and Isolation Panel    Frequency of Communication with Friends and Family: Once a week    Frequency of Social Gatherings with Friends and Family: Once a week    Attends Religious Services: Patient declined    Database administrator or Organizations: No    Attends Banker Meetings: Not on file    Marital Status: Widowed  Intimate Partner Violence: Not on file    Review of Systems  Constitutional:  Negative for fatigue and unexpected weight change.  Eyes:  Positive for visual disturbance.  Respiratory:  Negative for chest tightness and shortness of breath.   Cardiovascular:  Negative for chest pain, palpitations and leg swelling.  Gastrointestinal:  Negative for abdominal pain and blood in stool.  Neurological:  Negative for dizziness, syncope, light-headedness and headaches.     Objective:   Vitals:    08/23/24 0958  BP: 110/78  Pulse: 94  Temp: 98.7 F (37.1 C)  SpO2: 100%  Weight: 209 lb (94.8 kg)  Height: 5' 3 (1.6 m)     Physical Exam Vitals reviewed.  Constitutional:      Appearance: Normal appearance. She is well-developed.  HENT:     Head: Normocephalic and atraumatic.  Eyes:     General:        Right eye: No discharge.        Left eye: No discharge.     Extraocular Movements: Extraocular movements intact.     Conjunctiva/sclera: Conjunctivae normal.     Pupils: Pupils are equal, round, and reactive to light.     Comments: Appears to have bilateral proptosis.  Neck:     Vascular: No carotid bruit.  Cardiovascular:     Rate and Rhythm: Normal rate and regular rhythm.     Heart sounds: Normal heart sounds.  Pulmonary:     Effort: Pulmonary effort is normal.     Breath sounds: Normal breath sounds.  Abdominal:     Palpations: Abdomen is soft. There is no pulsatile mass.     Tenderness: There is no abdominal tenderness.  Musculoskeletal:     Right lower leg: No edema.     Left lower leg: No edema.  Skin:    General: Skin is warm and dry.  Neurological:     Mental Status: She is alert and oriented to person, place, and time.  Psychiatric:        Mood and Affect: Mood normal.        Behavior: Behavior normal.        Assessment & Plan:  GLADIES SOFRANKO is a 63 y.o. female . History of ulcer disease  - Consistent use of PPI discussed.  Recommended restart PPI.  Asymptomatic at this time.  Essential hypertension  - Stable on current regimen, continue same.  Mixed hyperlipidemia  - Recent labs as above.  Continue simvastatin .  Tolerating current regimen.  Vitamin D  deficiency Stable on current over-the-counter regimen, recent labs as above, continue same.  Blurry vision, bilateral - Plan: Ambulatory referral to Ophthalmology Watery eyes - Plan: Ambulatory referral to Ophthalmology History of Graves' disease - Plan: Ambulatory referral to  Ophthalmology  -  Will refer to ophthalmology for evaluation of watery eyes, blurry vision as well as possible thyroid  eye disease and treatment options.  Controlled type 2 diabetes mellitus with complication, without long-term current use of insulin (HCC) - Plan: Ambulatory referral to Ophthalmology, Blood Glucose Monitoring Suppl DEVI, Glucose Blood (BLOOD GLUCOSE TEST STRIPS) STRP, Lancet Device MISC, Lancets Misc. MISC  - Labs as above.  Recommended restart of exercise, low intensity.  Handout given on diabetes and diet, home blood sugar testing but no new meds for now.  59-month recheck with repeat labs.  Screening for cervical cancer - Plan: Ambulatory referral to Gynecology  Screening for HIV (human immunodeficiency virus) - Plan: HIV Antibody (routine testing w rflx)  DOE (dyspnea on exertion) - Plan: potassium chloride  (KLOR-CON ) 10 MEQ tablet  - Stable with current med regimen, refilled potassium.  Meds ordered this encounter  Medications   Blood Glucose Monitoring Suppl DEVI    Sig: May substitute to any manufacturer covered by patient's insurance. Check once per day.    Dispense:  1 each    Refill:  0   Glucose Blood (BLOOD GLUCOSE TEST STRIPS) STRP    Sig: May substitute to any manufacturer covered by patient's insurance. Check once per day.    Dispense:  100 strip    Refill:  0   Lancet Device MISC    Sig: May substitute to any manufacturer covered by patient's insurance. Check once per day.    Dispense:  1 each    Refill:  0   Lancets Misc. MISC    Sig: May substitute to any manufacturer covered by patient's insurance. Check once per day.    Dispense:  100 each    Refill:  0   potassium chloride  (KLOR-CON ) 10 MEQ tablet    Sig: Take 3 tablets (30 mEq total) by mouth daily.    Dispense:  270 tablet    Refill:  1   Patient Instructions  Try to walk 5-10 minutes per day as a start for exercise. See info below about diabetes and diet, home blood sugar testing.  No new  meds for now. Recheck in 3 months with repeat labs. I will refer you to eye doctor for your eye symptoms.  I will refer you to a gynecologist for cervical cancer screening.  I do recommend taking your omeprazole  daily for heartburn and with history of ulcer.  I will see you in 3 months, but if there are any concerns that we did not address today or any new symptoms please see me sooner.  Take care  Type 2 Diabetes Mellitus, Self-Care, Adult Caring for yourself after you have been diagnosed with type 2 diabetes (type 2 diabetes mellitus) means keeping your blood sugar (glucose) under control with a balance of: Nutrition. Exercise. Lifestyle changes. Medicines or insulin, if needed. Support from your team of health care providers and others. What are the risks? Having type 2 diabetes can put you at risk for other long-term (chronic) conditions, such as heart disease and kidney disease. Your health care provider may prescribe medicines to help prevent complications from diabetes. How to monitor your blood glucose  Check your blood glucose every day or as often as told by your health care provider. Have your A1C (hemoglobin A1C) level checked two or more times a year, or as often as told by your health care provider. Your health care provider will set personalized treatment goals for you. Generally, the goal of treatment is to maintain the following  blood glucose levels: Before meals: 80-130 mg/dL (4.4-7.2 mmol/L). After meals: below 180 mg/dL (10 mmol/L). A1C level: less than 7%. How to manage hyperglycemia and hypoglycemia Hyperglycemia symptoms Hyperglycemia, also called high blood glucose, occurs when blood glucose is too high. Make sure you know the early signs of hyperglycemia, such as: Increased thirst. Hunger. Feeling very tired. Needing to urinate more often than usual. Blurry vision. Hypoglycemia symptoms Hypoglycemia, also called low blood glucose, occurs with a blood glucose level  at or below 70 mg/dL (3.9 mmol/L). Diabetes medicines lower your blood glucose and can cause hypoglycemia. The risk for hypoglycemia increases during or after exercise, during sleep, during illness, and when skipping meals or not eating for a long time (fasting). It is important to know the symptoms of hypoglycemia and treat it right away. Always have a 15-gram rapid-acting carbohydrate snack with you to treat low blood glucose. Family members and close friends should also know the symptoms and understand how to treat hypoglycemia, in case you are not able to treat yourself. Symptoms may include: Hunger. Anxiety. Sweating and feeling clammy. Dizziness or feeling light-headed. Sleepiness. Increased heart rate. Irritability. Tingling or numbness around the mouth, lips, or tongue. Restless sleep. Severe hypoglycemia is when your blood glucose level is at or below 54 mg/dL (3 mmol/L). Severe hypoglycemia is an emergency. Do not wait to see if the symptoms will go away. Get medical help right away. Call your local emergency services (911 in the [place] Do not drive yourself to the hospital. If you have severe hypoglycemia and you cannot eat or drink, you may need glucagon. A family member or close friend should learn how to check your blood glucose and how to give you glucagon. Ask your health care provider if you need to have an emergency glucagon kit available. Follow these instructions at home: Medicines Take prescribed insulin or diabetes medicines as told by your health care provider. Do not run out of insulin or other diabetes medicines. Plan ahead so you always have these available. If you use insulin, adjust your dosage based on your physical activity and what foods you eat. Your health care provider will tell you how to adjust your dosage. Take over-the-counter and prescription medicines only as told by your health care provider. Eating and drinking  What you eat and drink affects your blood  glucose and your insulin dosage. Making good choices helps to control your diabetes and prevent other health problems. A healthy meal plan includes eating lean proteins, complex carbohydrates, fresh fruits and vegetables, low-fat dairy products, and healthy fats. Make an appointment to see a registered dietitian to help you create an eating plan that is right for you. Make sure that you: Follow instructions from your health care provider about eating or drinking restrictions. Drink enough fluid to keep your urine pale yellow. Keep a record of the carbohydrates that you eat. Do this by reading food labels and learning the standard serving sizes of foods. Follow your sick-day plan whenever you cannot eat or drink as usual. Make this plan in advance with your health care provider.  Activity Stay active. Exercise regularly, as told by your health care provider. This may include: Stretching and doing strength exercises, such as yoga or weight lifting, two or more times a week. Doing 150 minutes or more of moderate-intensity or vigorous-intensity exercise each week. This could be brisk walking, biking, or water aerobics. Spread out your activity over 3 or more days of the week. Do not go more  than 2 days in a row without doing some kind of physical activity. When you start a new exercise or activity, work with your health care provider to adjust your insulin, medicines, or food intake as needed. Lifestyle Do not use any products that contain nicotine or tobacco. These products include cigarettes, chewing tobacco, and vaping devices, such as e-cigarettes. If you need help quitting, ask your health care provider. If you drink alcohol and your health care provider says that it is safe for you: Limit how much you have to: 0-1 drink a day for women who are not pregnant. 0-2 drinks a day for men. Know how much alcohol is in your drink. In the [place] one drink equals one 12 oz bottle of beer (355 mL), one 5 oz  glass of wine (148 mL), or one 1 oz glass of hard liquor (44 mL). Learn to manage stress. If you need help with this, ask your health care provider. Take care of your body  Keep your immunizations up to date. In addition to getting vaccinations as told by your health care provider, it is recommended that you get vaccinated against the following illnesses: The flu (influenza). Get a flu shot every year. Pneumonia. Hepatitis B. Schedule an eye exam soon after your diagnosis, and then one time every year after that. Check your skin and feet every day for cuts, bruises, redness, blisters, or sores. Schedule a foot exam with your health care provider once every year. Brush your teeth and gums two times a day, and floss one or more times a day. Visit your dentist one or more times every 6 months. Maintain a healthy weight. General instructions Share your diabetes management plan with people in your workplace, school, and household. Carry a medical alert card or wear medical alert jewelry. Keep all follow-up visits. This is important. Questions to ask your health care provider Should I meet with a certified diabetes care and education specialist? Where can I find a support group for people with diabetes? Where to find more information For help and guidance and for more information about diabetes, please visit: American Diabetes Association (ADA): www.diabetes.org American Association of Diabetes Care and Education Specialists (ADCES): www.diabeteseducator.org International Diabetes Federation (IDF): DCOnly.dk Summary Caring for yourself after you have been diagnosed with type 2 diabetes (type 2 diabetes mellitus) means keeping your blood sugar (glucose) under control with a balance of nutrition, exercise, lifestyle changes, and medicine. Check your blood glucose every day, as often as told by your health care provider. Having diabetes can put you at risk for other long-term (chronic)  conditions, such as heart disease and kidney disease. Your health care provider may prescribe medicines to help prevent complications from diabetes. Share your diabetes management plan with people in your workplace, school, and household. Keep all follow-up visits. This is important. This information is not intended to replace advice given to you by your health care provider. Make sure you discuss any questions you have with your health care provider. Document Revised: 04/21/2021 Document Reviewed: 04/21/2021 Elsevier Patient Education  2024 Elsevier Inc.      Signed,   Reyes Pines, MD [facility], [facility] 08/23/24 10:55 AM

## 2024-08-24 LAB — HIV ANTIBODY (ROUTINE TESTING W REFLEX)
HIV 1&2 Ab, 4th Generation: NONREACTIVE
HIV FINAL INTERPRETATION: NEGATIVE

## 2024-08-25 ENCOUNTER — Ambulatory Visit: Payer: Self-pay | Admitting: Family Medicine

## 2024-08-26 ENCOUNTER — Telehealth: Payer: Self-pay | Admitting: Podiatry

## 2024-08-26 ENCOUNTER — Ambulatory Visit: Admitting: Podiatry

## 2024-08-26 NOTE — Telephone Encounter (Signed)
 Called to get patient rescheduled due to provider being out of office.

## 2024-11-20 ENCOUNTER — Ambulatory Visit: Admitting: Podiatry

## 2024-11-20 DIAGNOSIS — Z91199 Patient's noncompliance with other medical treatment and regimen due to unspecified reason: Secondary | ICD-10-CM

## 2024-11-20 NOTE — Progress Notes (Signed)
 Cancel 24 hours

## 2024-11-22 ENCOUNTER — Ambulatory Visit: Admitting: Family Medicine

## 2024-11-25 ENCOUNTER — Encounter: Payer: Self-pay | Admitting: Podiatry

## 2024-11-25 ENCOUNTER — Ambulatory Visit: Admitting: Podiatry

## 2024-11-25 DIAGNOSIS — M79675 Pain in left toe(s): Secondary | ICD-10-CM | POA: Diagnosis not present

## 2024-11-25 DIAGNOSIS — M79674 Pain in right toe(s): Secondary | ICD-10-CM | POA: Diagnosis not present

## 2024-11-25 DIAGNOSIS — B351 Tinea unguium: Secondary | ICD-10-CM | POA: Diagnosis not present

## 2024-11-25 NOTE — Progress Notes (Signed)
"  °  Subjective:  Patient ID: Frances Conley, female    DOB: 1961/08/03,   MRN: [id number]  Chief Complaint  Patient presents with   Nail Problem    My nails, I have waited so long that my shoes have gotten messed up.  I want them to be cut as short as possible because they grow so long.  Sometime they are painful.    63 y.o. female presents for concern of thickened elongated and painful nails that are difficult to trim. Requesting to have them trimmed today. She is not sure if diabetic.   PCP:  Levora Reyes SAUNDERS, MD    . Denies any other pedal complaints. Denies n/v/f/c.   Past Medical History:  Diagnosis Date   Arthritis    both hips, hands, shoulders goes to Dr Heide, [facility]   Yzjijryz(215.9)    Hypercholesteremia    Hypertension    Ulcer     Objective:  Physical Exam: Vascular: DP/PT pulses 2/4 bilateral. CFT <3 seconds. Normal hair growth on digits. No edema.  Skin. No lacerations or abrasions bilateral feet. Nails 1-5 bilateral thickened and dystrophic.  Musculoskeletal: MMT 5/5 bilateral lower extremities in DF, PF, Inversion and Eversion. Deceased ROM in DF of ankle joint.  Neurological: Sensation intact to light touch.   Assessment:   1. Pain due to onychomycosis of toenails of both feet      Plan:  Patient was evaluated and treated and all questions answered.  ABN signed 11/25/24 -Mechanically debrided all nails 1-5 bilateral using sterile nail nipper and filed with dremel without incident  -Answered all patient questions -Patient to return  in 3 months for at risk foot care -Patient advised to call the office if any problems or questions arise in the meantime.   Asberry Failing, DPM    "

## 2025-02-13 ENCOUNTER — Encounter: Payer: Self-pay | Admitting: Obstetrics and Gynecology

## 2025-02-25 ENCOUNTER — Ambulatory Visit: Admitting: Podiatry
